# Patient Record
Sex: Male | Born: 1955 | Race: White | Hispanic: No | Marital: Married | Smoking: Former smoker
Health system: Southern US, Community
[De-identification: ages and names within clinical notes are randomized; demographics above are authoritative.]

## PROBLEM LIST (undated history)

## (undated) DIAGNOSIS — M1990 Unspecified osteoarthritis, unspecified site: Secondary | ICD-10-CM

## (undated) DIAGNOSIS — K5900 Constipation, unspecified: Secondary | ICD-10-CM

## (undated) DIAGNOSIS — R7989 Other specified abnormal findings of blood chemistry: Secondary | ICD-10-CM

## (undated) DIAGNOSIS — E119 Type 2 diabetes mellitus without complications: Secondary | ICD-10-CM

## (undated) DIAGNOSIS — E785 Hyperlipidemia, unspecified: Secondary | ICD-10-CM

## (undated) DIAGNOSIS — I2510 Atherosclerotic heart disease of native coronary artery without angina pectoris: Secondary | ICD-10-CM

## (undated) DIAGNOSIS — E559 Vitamin D deficiency, unspecified: Secondary | ICD-10-CM

## (undated) DIAGNOSIS — E538 Deficiency of other specified B group vitamins: Secondary | ICD-10-CM

## (undated) DIAGNOSIS — I10 Essential (primary) hypertension: Secondary | ICD-10-CM

## (undated) DIAGNOSIS — I219 Acute myocardial infarction, unspecified: Secondary | ICD-10-CM

## (undated) HISTORY — DX: Vitamin D deficiency, unspecified: E55.9

## (undated) HISTORY — DX: Acute myocardial infarction, unspecified: I21.9

## (undated) HISTORY — DX: Essential (primary) hypertension: I10

## (undated) HISTORY — DX: Atherosclerotic heart disease of native coronary artery without angina pectoris: I25.10

## (undated) HISTORY — PX: COLONOSCOPY: SHX5424

## (undated) HISTORY — DX: Other specified abnormal findings of blood chemistry: R79.89

## (undated) HISTORY — DX: Hyperlipidemia, unspecified: E78.5

## (undated) HISTORY — PX: JOINT REPLACEMENT: SHX530

## (undated) HISTORY — DX: Deficiency of other specified B group vitamins: E53.8

---
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
## 2012-10-09 DIAGNOSIS — I219 Acute myocardial infarction, unspecified: Secondary | ICD-10-CM

## 2012-10-09 HISTORY — PX: CORONARY ANGIOPLASTY WITH STENT PLACEMENT: SHX49

## 2012-10-09 HISTORY — DX: Acute myocardial infarction, unspecified: I21.9

## 2014-07-09 ENCOUNTER — Other Ambulatory Visit (HOSPITAL_COMMUNITY): Payer: Self-pay | Admitting: Family Medicine

## 2014-07-09 ENCOUNTER — Ambulatory Visit (HOSPITAL_COMMUNITY)
Admission: RE | Admit: 2014-07-09 | Discharge: 2014-07-09 | Disposition: A | Discharge status: Home / Self Care | Payer: Disability Insurance | Source: Ambulatory Visit | Attending: Family Medicine | Admitting: Family Medicine

## 2014-07-09 DIAGNOSIS — M25561 Pain in right knee: Secondary | ICD-10-CM | POA: Diagnosis not present

## 2014-07-09 DIAGNOSIS — M25562 Pain in left knee: Secondary | ICD-10-CM | POA: Diagnosis present

## 2014-12-04 ENCOUNTER — Telehealth: Payer: Self-pay | Admitting: Cardiovascular Disease

## 2014-12-04 ENCOUNTER — Ambulatory Visit (INDEPENDENT_AMBULATORY_CARE_PROVIDER_SITE_OTHER): Payer: Medicaid Other | Admitting: Cardiovascular Disease

## 2014-12-04 ENCOUNTER — Encounter: Payer: Self-pay | Admitting: *Deleted

## 2014-12-04 VITALS — BP 144/94 | HR 80 | Ht 72.0 in | Wt 246.0 lb

## 2014-12-04 DIAGNOSIS — R918 Other nonspecific abnormal finding of lung field: Secondary | ICD-10-CM

## 2014-12-04 DIAGNOSIS — I2510 Atherosclerotic heart disease of native coronary artery without angina pectoris: Secondary | ICD-10-CM

## 2014-12-04 DIAGNOSIS — R635 Abnormal weight gain: Secondary | ICD-10-CM

## 2014-12-04 DIAGNOSIS — I10 Essential (primary) hypertension: Secondary | ICD-10-CM

## 2014-12-04 DIAGNOSIS — Z87891 Personal history of nicotine dependence: Secondary | ICD-10-CM

## 2014-12-04 DIAGNOSIS — Z833 Family history of diabetes mellitus: Secondary | ICD-10-CM

## 2014-12-04 DIAGNOSIS — E785 Hyperlipidemia, unspecified: Secondary | ICD-10-CM

## 2014-12-04 MED ORDER — LISINOPRIL 20 MG PO TABS: 20.0000 mg | ORAL_TABLET | Freq: Every day | ORAL | Status: DC

## 2014-12-04 NOTE — Telephone Encounter (Signed)
Low dose chest CT scan without contrast for pulmonary nodules, h/o tobacco abuse, lung cancer screening Schedule at [facility] March 2 @8 ;00am arrive at 7:45

## 2014-12-04 NOTE — Patient Instructions (Signed)
   Labs:  Lipids & liver function (fasting), HgA1C, BMET (due 2 weeks, 12/18/14)  Stop Plavix  Increase Lisinopril to 20mg  daily - new sent to pharmacy today.  Continue all other medications.   Low dose CT scan  Office will contact with results via phone or letter.   Your physician wants you to follow up in: 6 months.  You will receive a reminder letter in the mail one-two months in advance.  If you don't receive a letter, please call our office to schedule the follow up appointment

## 2014-12-04 NOTE — Progress Notes (Signed)
Patient ID: Darius Rogers, male   DOB: 1956-07-26, 59 y.o.   MRN: [id number]       CARDIOLOGY CONSULT NOTE  Patient ID: Darius Rogers MRN: [id number] DOB/AGE: Aug 22, 1956 59 y.o.  Admit date: (Not on file) Primary Physician Monico Blitz, MD  Reason for Consultation: CAD  HPI: The patient is a 59 year old male with a history of non-ST elevation myocardial infarction and underwent percutaneous coronary intervention with a drug-eluting stent to the LAD in July 2014 at [facility] in [place], [place]. He also has a history of accelerated hypertension, tobacco abuse, and obesity. He had an echocardiogram at that time which reportedly demonstrated normal left ventricular systolic and diastolic function, LVEF 60%. Coronary angiography on 05/05/13 demonstrated a 95% stenosis of the LAD with mild luminal irregularities in the left circumflex and a mid RCA stenosis of 20%. The RCA was the dominant artery. CT angiography of the chest at that time showed no evidence of acute pulmonary embolism but did show small indeterminate bilateral lung nodules and multiple low-density lesions throughout the liver presumed to be cysts.  He denies chest pain and shortness of breath. 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 Interestingly, he had been climbing up a tree for his work and as he was coming down he saw a rattlesnake and that is when he initially began to feel chest pain prior to being diagnosed with a myocardial infarction.  He has had a 30 pound weight gain since moving to [place]. He attributes this to not working and being limited in physical activities due to bilateral knee pain for which he recently received cortisone injections. He said he has not had his cholesterol checked in the past 12 months. He also says he has not had a follow-up chest CT.  Soc: He quit smoking after his heart attack.   No  Known Allergies  Current Outpatient Prescriptions  Medication Sig Dispense Refill  . aspirin EC 81 MG tablet Take 81 mg by mouth daily.    Marland Kitchen atorvastatin (LIPITOR) 40 MG tablet Take 40 mg by mouth daily.    . carvedilol (COREG) 6.25 MG tablet Take 6.25 mg by mouth 2 (two) times daily with a meal.    . clopidogrel (PLAVIX) 75 MG tablet Take 75 mg by mouth daily.    Marland Kitchen lisinopril (PRINIVIL,ZESTRIL) 10 MG tablet Take 10 mg by mouth daily.     No current facility-administered medications for this visit.    Past Medical History  Diagnosis Date  . Hypertension   . Heart attack   . CAD (coronary artery disease)   . Hyperlipemia     Past Surgical History  Procedure Laterality Date  . Left lower arm surgery      in high school around age 70    History   Social History  . Marital Status: Single    Spouse Name: N/A  . Number of Children: N/A  . Years of Education: N/A   Occupational History  . Not on file.   Social History Main Topics  . Smoking status: Former Smoker -- 0.10 packs/day for 8 years    Types: Cigarettes    Start date: 10/09/2004    Quit date: 05/09/2013  . Smokeless tobacco: Never Used  . Alcohol Use: Not on file  . Drug Use: Not on file  . Sexual Activity: Not on file   Other Topics Concern  .  Not on file   Social History Narrative  . No narrative on file     No family history of premature CAD in 1st degree relatives.  Prior to Admission medications   Medication Sig Start Date End Date Taking? Authorizing Provider  aspirin EC 81 MG tablet Take 81 mg by mouth daily.   Yes Historical Provider, MD  atorvastatin (LIPITOR) 40 MG tablet Take 40 mg by mouth daily.   Yes Historical Provider, MD  carvedilol (COREG) 6.25 MG tablet Take 6.25 mg by mouth 2 (two) times daily with a meal.   Yes Historical Provider, MD  clopidogrel (PLAVIX) 75 MG tablet Take 75 mg by mouth daily.   Yes Historical Provider, MD  lisinopril (PRINIVIL,ZESTRIL) 10 MG tablet Take 10 mg  by mouth daily.   Yes Historical Provider, MD     Review of systems complete and found to be negative unless listed above in HPI     Physical exam Blood pressure 144/94, pulse 80, height 6' (1.829 m), weight 246 lb (111.585 kg), SpO2 95 %. General: NAD Neck: No JVD, no thyromegaly or thyroid nodule.  Lungs: Clear to auscultation bilaterally with normal respiratory effort. CV: Nondisplaced PMI. Regular rate and rhythm, normal S1/S2, no S3/S4, no murmur.  No peripheral edema.  No carotid bruit.  Normal pedal pulses.  Abdomen: Soft, nontender, obese, no distention.  Skin: Intact without lesions or rashes.  Neurologic: Alert and oriented x 3.  Psych: Normal affect. Extremities: No clubbing or cyanosis.  HEENT: Normal.   ECG: Most recent ECG reviewed.  Labs:  No results found for: WBC, HGB, HCT, MCV, PLT No results for input(s): NA, K, CL, CO2, BUN, CREATININE, CALCIUM, PROT, BILITOT, ALKPHOS, ALT, AST, GLUCOSE in the last 168 hours.  Invalid input(s): LABALBU No results found for: CKTOTAL, CKMB, CKMBINDEX, TROPONINI No results found for: CHOL No results found for: HDL No results found for: LDLCALC No results found for: TRIG No results found for: CHOLHDL No results found for: LDLDIRECT       Studies: No results found.  ASSESSMENT AND PLAN:  1. CAD with h/o NSTEMI and LAD drug-eluting stent: Symptomatically stable. As it has been over one year since undergoing stent placement and since he only had single vessel disease, I will discontinue Plavix as this is no longer indicated. I will continue aspirin 81 mg daily and Lipitor 40 mg daily along with carvedilol 6.25 mg twice daily. Due to elevated blood pressure, I will increase lisinopril to 10 mg daily.  2. Essential hypertension: Due to elevated blood pressure, I will increase lisinopril to 10 mg daily. Will check BMET in 1-2 weeks.  3. Hyperlipidemia: Will check lipids and LFT's. Continue Lipitor 40 mg daily.  4. Weight  gain: Mother has diabetes. I will check HbA1C.  5. Lung nodules and history of tobacco abuse: Annual screening is indicated for lung cancer with low dose chest CT. I will order.  Dispo: f/u 6 months.   Signed: Kate Sable, M.D., F.A.C.C.  12/04/2014, 2:06 PM

## 2014-12-09 ENCOUNTER — Ambulatory Visit (HOSPITAL_COMMUNITY)
Admission: RE | Admit: 2014-12-09 | Discharge: 2014-12-09 | Disposition: A | Discharge status: Home / Self Care | Payer: Medicaid Other | Source: Ambulatory Visit | Attending: Cardiovascular Disease | Admitting: Cardiovascular Disease

## 2014-12-09 DIAGNOSIS — R918 Other nonspecific abnormal finding of lung field: Secondary | ICD-10-CM

## 2014-12-09 DIAGNOSIS — I700 Atherosclerosis of aorta: Secondary | ICD-10-CM | POA: Insufficient documentation

## 2014-12-09 DIAGNOSIS — I2510 Atherosclerotic heart disease of native coronary artery without angina pectoris: Secondary | ICD-10-CM | POA: Diagnosis not present

## 2014-12-09 DIAGNOSIS — Z87891 Personal history of nicotine dependence: Secondary | ICD-10-CM

## 2014-12-11 ENCOUNTER — Telehealth: Payer: Self-pay | Admitting: *Deleted

## 2014-12-11 DIAGNOSIS — E119 Type 2 diabetes mellitus without complications: Secondary | ICD-10-CM

## 2014-12-11 MED ORDER — METFORMIN HCL 500 MG PO TABS: 500.0000 mg | ORAL_TABLET | Freq: Two times a day (BID) | ORAL | Status: DC

## 2014-12-11 NOTE — Telephone Encounter (Signed)
Notes Recorded by Laurine Blazer, LPN on 02/15/1367 at 5:99 PM Patient notified.

## 2014-12-11 NOTE — Telephone Encounter (Signed)
Notes Recorded by Laurine Blazer, LPN on 12/14/9022 at 0:97 PM Patient notified. Will send new rx to [facility]. Also, will put in referral for dietary consult. Will forward all info to PMD Manuella Ghazi) as cardiology will not manage this long term. Patient will call Dr. Manuella Ghazi for follow up soon in his office to discuss these findings.

## 2014-12-11 NOTE — Telephone Encounter (Addendum)
CT OF CHEST -   ---- Message from Herminio Commons, MD sent at 12/09/2014 10:17 AM EST ----- Would have him f/u with PCP or pulmonary regarding multiple nodules in lung. Should f/u with PCP regarding hepatic lesions.

## 2014-12-11 NOTE — Telephone Encounter (Signed)
LABS -   Notes Recorded by Herminio Commons, MD on 12/11/2014 at 1:55 PM He has newly diagnosed type 2 diabetes mellitus. Will start metformin 500 mg bid and repeat HbA1C in 3 months. He will need a dietary consultation. Lipids look controlled.

## 2014-12-24 NOTE — Telephone Encounter (Signed)
Called the Nutrition and Diabetic Education department at [phone]. Left message asking that they return call to set up appointment.

## 2015-01-14 ENCOUNTER — Other Ambulatory Visit: Payer: Self-pay | Admitting: Cardiovascular Disease

## 2015-02-17 ENCOUNTER — Encounter: Payer: Medicaid Other | Attending: "Endocrinology | Admitting: Nutrition

## 2015-02-17 ENCOUNTER — Encounter: Payer: Self-pay | Admitting: Nutrition

## 2015-02-17 VITALS — Ht 72.0 in | Wt 245.6 lb

## 2015-02-17 DIAGNOSIS — IMO0002 Reserved for concepts with insufficient information to code with codable children: Secondary | ICD-10-CM

## 2015-02-17 DIAGNOSIS — Z713 Dietary counseling and surveillance: Secondary | ICD-10-CM | POA: Diagnosis not present

## 2015-02-17 DIAGNOSIS — E669 Obesity, unspecified: Secondary | ICD-10-CM | POA: Diagnosis not present

## 2015-02-17 DIAGNOSIS — Z6833 Body mass index (BMI) 33.0-33.9, adult: Secondary | ICD-10-CM | POA: Diagnosis not present

## 2015-02-17 DIAGNOSIS — E118 Type 2 diabetes mellitus with unspecified complications: Secondary | ICD-10-CM | POA: Insufficient documentation

## 2015-02-17 DIAGNOSIS — E1165 Type 2 diabetes mellitus with hyperglycemia: Secondary | ICD-10-CM

## 2015-02-17 NOTE — Progress Notes (Signed)
  Medical Nutrition Therapy:  Appt start time: 1400 end time:  1500.   Assessment:  Primary concerns today: Diabetes. Lives with his parents. He does the shopping and shares the cooking. Most foods are fried and baked foods. Most recently moved here from [place] 2  Years ago. Physical activity: joined the GYM in the mall. Has knee problems. Feels slightly depressed since he has moved here from [place] with no friends or social life. He feels he can't do much since he had a heart attack. Wants to lose weight . He notes she eat a lot of the junk food his parents have around the house. His mom cooks [place] ways and has a lot of fat and sugar in them. He is willing to change his eating habits and not eat the junk food around the house.  Recently told he has Type 2 diabetes with A1C of 6.8% from [facility]. He was given a meter but it isn't covered under Medicaid and needs an Accucheck Aviva or Nano since he is on Medicaid. Called Dr. Trena Platt office to request prescription for him to be called in to [facility] in [place]. Will start testing once he gets his supplies.  Recently started on Metformin 500 mg BID. Preferred Learning Style:  Auditory  Visual  Hands on  No preference indicated   Learning Readiness:    Ready  Change in progress   MEDICATIONS: See  list   DIETARY INTAKE   24-hr recall:  B ( AM):  Coffee skips.  Snk ( AM): nothing L ( PM): Tacos 2; burittos supreme and taco, water Snk ( PM):   cakes D ( PM): steak and salad-ginger dressing, water Snk ( PM): strawberry shortcake, water Beverages: Water  Usual physical activity: Not much  Estimated energy needs: 1800 calories 200 g carbohydrates 135 g protein 50 g fat  Progress Towards Goal(s):  In progress.   Nutritional Diagnosis:  NB-1.1 Food and nutrition-related knowledge deficit As related to Diabetes and obesity.  As evidenced by A1C of 6.8% and BMI > 40..    Intervention:  Nutrition  counseling on diabetes, weight loss, signs/symptoms of hyper/hypoglycemia and treatment of both, complications from DM, low fat high fiber low sodium diet, testing, portion sizes, MY Plate, CHO counting and benefits of exercise for weight loss..  Goal: 1. Follow the Plate Method. 2. Do not skip meals. 3. Cut out junk food and snacks between meals. 4. Increase fresh fruits and vegetables. 5. Exercise 30 minutes 5 days per week. 6. Lose 1-2 lbs per week. 7. Get A1C down below 6% in three months. 8. Test blood sugars a few times a week in am and evening before bed and record on log and bring at visit.  Teaching Method Utilized:  Visual Auditory Hands on  Handouts given during visit include:  The Plate Method  THe Meal Plan Card  Barriers to learning/adherence to lifestyle change: Knee problems  Demonstrated degree of understanding via:  Teach Back   Monitoring/Evaluation:  Dietary intake, exercise, Meal planning, SBG, and body weight in 1 month(s).  Recommend to consider referral to [facility] for depression and/or depression medication. He notes he has been depressed having moved from [place] and doesn't have friends or social life here in [place] and caring for his aging parents. Also depressed because he thinks he can't do much now since he has had a heart attack and he has knee problems.

## 2015-02-17 NOTE — Patient Instructions (Signed)
Goal: 1. Follow the Plate Method. 2. Do not skip meals. 3. Cut out junk food and snacks between meals. 4. Increase fresh fruits and vegetables. 5. Exercise 30 minutes 5 days per week. 6. Lose 1-2 lbs per week. 7. Get A1C down below 6% in three months. 8. Test blood sugars a few times a week in am and evening before bed and record on log and bring at visit.

## 2015-04-08 ENCOUNTER — Ambulatory Visit: Payer: Medicaid Other | Admitting: Nutrition

## 2015-07-20 ENCOUNTER — Other Ambulatory Visit: Payer: Self-pay | Admitting: Cardiovascular Disease

## 2015-08-22 ENCOUNTER — Other Ambulatory Visit: Payer: Self-pay | Admitting: Cardiovascular Disease

## 2015-09-06 ENCOUNTER — Other Ambulatory Visit: Payer: Self-pay | Admitting: Cardiovascular Disease

## 2015-09-06 MED ORDER — ATORVASTATIN CALCIUM 40 MG PO TABS: 40.0000 mg | ORAL_TABLET | Freq: Every day | ORAL | Status: DC

## 2015-09-06 NOTE — Telephone Encounter (Signed)
(  LIPITOR) 40 MG tablet patient missed last appointment.  Needs refill . Appointment given to patient for follow up.  [place]. [place], [place]

## 2015-09-06 NOTE — Telephone Encounter (Signed)
Medication sent to pharmacy  

## 2015-09-14 ENCOUNTER — Other Ambulatory Visit: Payer: Self-pay | Admitting: Cardiovascular Disease

## 2015-09-15 ENCOUNTER — Other Ambulatory Visit: Payer: Self-pay | Admitting: *Deleted

## 2015-09-15 NOTE — Telephone Encounter (Signed)
Pt scheduled appt for 09/27/15 with Dr Bronson Ing and says [facility] told him atorvastatin refill was never received. Spoke with [place] at [facility] who says that medication was placed on hold and she took verbal order to go ahead and refill it. Pt made aware and will pick up medication later this afternoon.

## 2015-09-27 ENCOUNTER — Encounter: Payer: Self-pay | Admitting: Cardiovascular Disease

## 2015-09-27 ENCOUNTER — Ambulatory Visit (INDEPENDENT_AMBULATORY_CARE_PROVIDER_SITE_OTHER): Payer: Medicaid Other | Admitting: Cardiovascular Disease

## 2015-09-27 VITALS — BP 148/92 | HR 88 | Ht 72.0 in | Wt 248.0 lb

## 2015-09-27 DIAGNOSIS — Z87891 Personal history of nicotine dependence: Secondary | ICD-10-CM

## 2015-09-27 DIAGNOSIS — E785 Hyperlipidemia, unspecified: Secondary | ICD-10-CM

## 2015-09-27 DIAGNOSIS — E119 Type 2 diabetes mellitus without complications: Secondary | ICD-10-CM

## 2015-09-27 DIAGNOSIS — I2510 Atherosclerotic heart disease of native coronary artery without angina pectoris: Secondary | ICD-10-CM

## 2015-09-27 DIAGNOSIS — I10 Essential (primary) hypertension: Secondary | ICD-10-CM | POA: Diagnosis not present

## 2015-09-27 MED ORDER — CARVEDILOL 12.5 MG PO TABS: 12.5000 mg | ORAL_TABLET | Freq: Two times a day (BID) | ORAL | Status: DC

## 2015-09-27 NOTE — Progress Notes (Signed)
Patient ID: Darius Rogers, male   DOB: 21-May-1956, 59 y.o.   MRN: [id number]      SUBJECTIVE: The patient is a 59 year old male with a history of non-ST elevation myocardial infarction and underwent percutaneous coronary intervention with a drug-eluting stent to the LAD in July 2014 at [facility] in [place], [place]. He also has a history of accelerated hypertension, tobacco abuse, and obesity. He had an echocardiogram at that time which reportedly demonstrated normal left ventricular systolic and diastolic function, LVEF [id number]. Coronary angiography on 05/05/13 demonstrated a 95% stenosis of the LAD with mild luminal irregularities in the left circumflex and a mid RCA stenosis of 20%. The RCA was the dominant artery.  He denies chest pain and shortness of breath. Taking all his meds regularly. Says blood sugars run in 140 range.  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 Interestingly, he had been climbing up a tree for his work and as he was coming down he saw a rattlesnake and that is when he initially began to feel chest pain prior to being diagnosed with a myocardial infarction.   Review of Systems: As per "subjective", otherwise negative.  No Known Allergies  Current Outpatient Prescriptions  Medication Sig Dispense Refill  . aspirin EC 81 MG tablet Take 81 mg by mouth daily.    Marland Kitchen atorvastatin (LIPITOR) 40 MG tablet Take 1 tablet (40 mg total) by mouth daily. 30 tablet 3  . carvedilol (COREG) 6.25 MG tablet Take 6.25 mg by mouth 2 (two) times daily with a meal.    . lisinopril (PRINIVIL,ZESTRIL) 20 MG tablet TAKE ONE TABLET BY MOUTH ONCE DAILY NEEDS  APPOINTMENT  FOR  FURTHER  REFILLS 30 tablet 0  . metFORMIN (GLUCOPHAGE) 500 MG tablet Take 1 tablet (500 mg total) by mouth 2 (two) times daily. 60 tablet 0   No current facility-administered medications for this visit.    Past Medical  History  Diagnosis Date  . Hypertension   . Heart attack ([place])   . CAD (coronary artery disease)   . Hyperlipemia   . Diabetes mellitus without complication [facility])     Past Surgical History  Procedure Laterality Date  . Left lower arm surgery      in high school around age 27    Social History   Social History  . Marital Status: Single    Spouse Name: N/A  . Number of Children: N/A  . Years of Education: N/A   Occupational History  . Not on file.   Social History Main Topics  . Smoking status: Former Smoker -- 0.10 packs/day for 8 years    Types: Cigarettes    Start date: 10/09/2004    Quit date: 05/09/2013  . Smokeless tobacco: Never Used  . Alcohol Use: Not on file  . Drug Use: Not on file  . Sexual Activity: Not on file   Other Topics Concern  . Not on file   Social History Narrative     Filed Vitals:   09/27/15 1433  BP: 148/92  Pulse: 88  Height: 6' (1.829 m)  Weight: 248 lb (112.492 kg)    PHYSICAL EXAM General: NAD HEENT: Normal. Neck: No JVD, no thyromegaly. Lungs: Clear to auscultation bilaterally with normal respiratory effort. CV: Nondisplaced PMI.  Regular rate and rhythm, normal S1/S2, no S3/S4, no murmur. No pretibial or periankle edema.  No carotid bruit.  Normal pedal pulses.  Abdomen: Soft, nontender, no hepatosplenomegaly, no distention.  Neurologic: Alert and oriented x 3.  Psych: Normal affect. Skin: Normal. Musculoskeletal: Normal range of motion, no gross deformities. Extremities: No clubbing or cyanosis.   ECG: Most recent ECG reviewed.      ASSESSMENT AND PLAN: 1. CAD with h/o NSTEMI and LAD drug-eluting stent: Symptomatically stable. I will continue aspirin 81 mg daily and Lipitor 40 mg daily along, lisinopril 20 mg, and increase carvedilol to 12.5 mg twice daily.   2. Essential hypertension: Elevated. Will increase carvedilol to 12.5 mg twice daily.   3. Hyperlipidemia: Controlled in 12/2014. Continue Lipitor 40 mg  daily.  4. Type 2 diabetes: I started metformin 500 mg bid in 12/2014 for newly diagnosed diabetes. Followed by PCP now.  Dispo: f/u 6 months.    Kate Sable, M.D., F.A.C.C.

## 2015-09-27 NOTE — Addendum Note (Signed)
Addended by: Laurine Blazer on: 09/27/2015 03:08 PM   Modules accepted: Orders

## 2015-09-27 NOTE — Patient Instructions (Addendum)
   Increase Coreg to 12.5mg  twice a day  - new sent to [facility] today. Continue all other medications.   Your physician wants you to follow up in: 6 months.  You will receive a reminder letter in the mail one-two months in advance.  If you don't receive a letter, please call our office to schedule the follow up appointment

## 2015-10-14 ENCOUNTER — Other Ambulatory Visit: Payer: Self-pay | Admitting: Cardiovascular Disease

## 2016-03-14 ENCOUNTER — Ambulatory Visit (INDEPENDENT_AMBULATORY_CARE_PROVIDER_SITE_OTHER): Payer: Medicaid Other | Admitting: Cardiovascular Disease

## 2016-03-14 ENCOUNTER — Encounter: Payer: Self-pay | Admitting: Cardiovascular Disease

## 2016-03-14 VITALS — BP 110/70 | HR 75 | Ht 72.0 in | Wt 243.0 lb

## 2016-03-14 DIAGNOSIS — I2510 Atherosclerotic heart disease of native coronary artery without angina pectoris: Secondary | ICD-10-CM

## 2016-03-14 DIAGNOSIS — I10 Essential (primary) hypertension: Secondary | ICD-10-CM

## 2016-03-14 DIAGNOSIS — Z87891 Personal history of nicotine dependence: Secondary | ICD-10-CM | POA: Diagnosis not present

## 2016-03-14 DIAGNOSIS — E785 Hyperlipidemia, unspecified: Secondary | ICD-10-CM | POA: Diagnosis not present

## 2016-03-14 DIAGNOSIS — E119 Type 2 diabetes mellitus without complications: Secondary | ICD-10-CM

## 2016-03-14 MED ORDER — ASPIRIN EC 81 MG PO TBEC: 81.0000 mg | DELAYED_RELEASE_TABLET | Freq: Every day | ORAL | Status: DC

## 2016-03-14 MED ORDER — MELOXICAM 15 MG PO TABS: 15.0000 mg | ORAL_TABLET | ORAL | Status: DC | PRN

## 2016-03-14 NOTE — Patient Instructions (Signed)
   Take your Meloxicam only as needed.  Resume your Aspirin 81mg  daily. Continue all other medications.   Lab for Lipids - order given today - Reminder:  Nothing to eat or drink after 12 midnight prior to labs. Office will contact with results via phone or letter.    Your physician wants you to follow up in:  1 year.  You will receive a reminder letter in the mail one-two months in advance.  If you don't receive a letter, please call our office to schedule the follow up appointment

## 2016-03-14 NOTE — Progress Notes (Signed)
Patient ID: Darius Rogers, male   DOB: 27-May-1956, 60 y.o.   MRN: [id number]      SUBJECTIVE: The patient presents for routine follow-up. He has a history of non-ST elevation myocardial infarction and underwent percutaneous coronary intervention with a drug-eluting stent to the LAD in July 2014 at [facility] in [place], [place]. He also has a history of accelerated hypertension, tobacco abuse, and obesity. He had an echocardiogram at that time which reportedly demonstrated normal left ventricular systolic and diastolic function, LVEF [id number]. Coronary angiography on 05/05/13 demonstrated a 95% stenosis of the LAD with mild luminal irregularities in the left circumflex and a mid RCA stenosis of 20%. The RCA was the dominant artery.  He denies chest pain and shortness of breath. He has problems with bilateral knee pain. He has been taking an NSAID daily. He stopped taking his aspirin. He denies abdominal pain and GI bleeding.  ECG performed in the office today which I personally interpreted demonstrated normal sinus rhythm with nonspecific T-wave abnormalities.  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 Interestingly, he had been climbing up a tree for his work and as he was coming down he saw a rattlesnake and that is when he initially began to feel chest pain prior to being diagnosed with a myocardial infarction.   Review of Systems: As per "subjective", otherwise negative.  No Known Allergies  Current Outpatient Prescriptions  Medication Sig Dispense Refill  . atorvastatin (LIPITOR) 40 MG tablet Take 1 tablet (40 mg total) by mouth daily. 30 tablet 3  . carvedilol (COREG) 12.5 MG tablet Take 1 tablet (12.5 mg total) by mouth 2 (two) times daily. 60 tablet 6  . lisinopril (PRINIVIL,ZESTRIL) 20 MG tablet TAKE ONE TABLET BY MOUTH ONCE DAILY ,  NEED  APPT  WITH  DR  FOR  ANY  FURTHER  REFILLS 30 tablet  11  . meloxicam (MOBIC) 15 MG tablet Take 15 mg by mouth daily.    . metFORMIN (GLUCOPHAGE) 500 MG tablet Take 1 tablet (500 mg total) by mouth 2 (two) times daily. 60 tablet 0  . NON FORMULARY 2 (two) times daily. hydroxycut 441mg      No current facility-administered medications for this visit.    Past Medical History  Diagnosis Date  . Hypertension   . Heart attack ([place])   . CAD (coronary artery disease)   . Hyperlipemia   . Diabetes mellitus without complication [facility])     Past Surgical History  Procedure Laterality Date  . Left lower arm surgery      in high school around age 28    Social History   Social History  . Marital Status: Single    Spouse Name: N/A  . Number of Children: N/A  . Years of Education: N/A   Occupational History  . Not on file.   Social History Main Topics  . Smoking status: Former Smoker -- 0.10 packs/day for 8 years    Types: Cigarettes    Start date: 10/09/2004    Quit date: 05/09/2013  . Smokeless tobacco: Never Used  . Alcohol Use: Not on file  . Drug Use: Not on file  . Sexual Activity: Not on file   Other Topics Concern  . Not on file   Social History Narrative     Filed Vitals:   03/14/16 1304  BP: 110/70  Pulse: 75  Height: 6' (  1.829 m)  Weight: 243 lb (110.224 kg)  SpO2: 95%    PHYSICAL EXAM General: NAD HEENT: Normal. Neck: No JVD, no thyromegaly. Lungs: Clear to auscultation bilaterally with normal respiratory effort. CV: Nondisplaced PMI.  Regular rate and rhythm, normal S1/S2, no S3/S4, no murmur. No pretibial or periankle edema.  No carotid bruit.   Abdomen: Obese. Neurologic: Alert and oriented.  Psych: Normal affect. Skin: Normal. Musculoskeletal: No gross deformities.    ECG: Most recent ECG reviewed.      ASSESSMENT AND PLAN: 1. CAD with h/o NSTEMI and LAD drug-eluting stent: Symptomatically stable. I will continue aspirin 81 mg daily (instructed to resume), Lipitor 40 mg daily, lisinopril 20  mg, and carvedilol 12.5 mg twice daily.  I told him to avoid taking NSAIDs daily as it increases his risk for GI bleeding.  2. Essential hypertension: Controlled. No changes.  3. Hyperlipidemia: Will check lipids. Continue Lipitor 40 mg daily.  4. Type 2 diabetes: I started metformin 500 mg bid in 12/2014 for newly diagnosed diabetes. Followed by PCP now.  Dispo: f/u 1 year.   Kate Sable, M.D., F.A.C.C.

## 2016-03-20 ENCOUNTER — Ambulatory Visit: Payer: Medicaid Other | Admitting: Cardiovascular Disease

## 2016-03-21 ENCOUNTER — Encounter: Payer: Self-pay | Admitting: *Deleted

## 2016-03-21 ENCOUNTER — Telehealth: Payer: Self-pay | Admitting: *Deleted

## 2016-03-21 NOTE — Telephone Encounter (Signed)
Notes Recorded by Laurine Blazer, LPN on [id number] at [id number] AM Patient notified and verbalized understanding. Copy to pmd. Diet info mailed to patient.   Notes Recorded by Herminio Commons, MD on 03/16/2016 at 10:50 AM Good LDL. TG's only mildly elevated. Requires dietary modification.

## 2016-08-28 ENCOUNTER — Other Ambulatory Visit: Payer: Self-pay | Admitting: Cardiovascular Disease

## 2016-10-26 ENCOUNTER — Other Ambulatory Visit: Payer: Self-pay | Admitting: Cardiovascular Disease

## 2017-01-18 ENCOUNTER — Other Ambulatory Visit: Payer: Self-pay | Admitting: Cardiovascular Disease

## 2017-01-18 MED ORDER — ATORVASTATIN CALCIUM 40 MG PO TABS: 40.0000 mg | ORAL_TABLET | Freq: Every day | ORAL | 3 refills | Status: DC

## 2017-01-18 NOTE — Telephone Encounter (Signed)
° °  What is needed(details of request)? REFILL REQUEST   Urgent-Message or medical issue (Chest/Arm pain, neck pain, Shortness of Breath, dizziness, weak)-Nurse to be contacted immediately.  Non-Urgent Message-Route message to nurse/provider.  Needs refill for atorvastatin (LIPITOR) 40 MG tablet   [facility] [place]

## 2017-01-18 NOTE — Telephone Encounter (Signed)
Medication sent to pharmacy  

## 2017-01-31 ENCOUNTER — Ambulatory Visit (INDEPENDENT_AMBULATORY_CARE_PROVIDER_SITE_OTHER): Payer: Self-pay

## 2017-01-31 ENCOUNTER — Encounter (INDEPENDENT_AMBULATORY_CARE_PROVIDER_SITE_OTHER): Payer: Self-pay | Admitting: Orthopaedic Surgery

## 2017-01-31 ENCOUNTER — Ambulatory Visit (INDEPENDENT_AMBULATORY_CARE_PROVIDER_SITE_OTHER): Payer: Medicaid Other | Admitting: Orthopaedic Surgery

## 2017-01-31 ENCOUNTER — Ambulatory Visit (INDEPENDENT_AMBULATORY_CARE_PROVIDER_SITE_OTHER): Payer: Medicaid Other

## 2017-01-31 VITALS — BP 105/74 | HR 76 | Ht 72.0 in | Wt 237.0 lb

## 2017-01-31 DIAGNOSIS — M25561 Pain in right knee: Secondary | ICD-10-CM

## 2017-01-31 DIAGNOSIS — M25562 Pain in left knee: Secondary | ICD-10-CM

## 2017-01-31 DIAGNOSIS — G8929 Other chronic pain: Secondary | ICD-10-CM

## 2017-01-31 NOTE — Progress Notes (Signed)
Office Visit Note   Patient: Darius Rogers           Date of Birth: 12-05-1955           MRN: [id number] Visit Date: 01/31/2017              Requested by: Monico Blitz, MD [place], [place] [address] PCP: Monico Blitz, MD   Assessment & Plan: Visit Diagnoses:  1. Chronic pain of both knees   End-stage osteoarthritis both knees  Plan: Long discussion regarding end stage findings per film. His Medicaid will no longer cover Visco supplementation. We discussed medicines, injections, bracing and even knee replacement. He is planning a trip to the [place] in June and I would suggest that he wait until a week or so before the trip to have a cortisone injection. He's thinking about having a right knee replacement sometime in the summer. We talked about the incision the hospitalization the rehabilitation. I think he has a good understanding of the above procedure. Think about this more over the next several months  Follow-Up Instructions: Return in about 1 month (around 03/02/2017).   Orders:  Orders Placed This Encounter  Procedures  . XR KNEE 3 VIEW LEFT  . XR KNEE 3 VIEW RIGHT   No orders of the defined types were placed in this encounter.     Procedures: No procedures performed   Clinical Data: No additional findings.   Subjective: No chief complaint on file. HPI  Mr. Couper relates that he continues to have pain in both knees sometimes to the point of compromise. He is having more trouble on the right than the left. He did complete a series of Euflexxa injections in the fall of last year which made a "big difference". He is now aware that Medicaid no longer covers the Visco supplementation. He is anticipating a trip to the [place] in June and wanted to discuss both knee replacement as well as cortisone injection  Review of Systems   Objective: Vital Signs: BP 105/74   Pulse 76   Ht 6' (1.829 m)   Wt 237 lb (107.5 kg)   BMI 32.14 kg/m   Physical  Exam  Ortho Exam exam of both knees is about the same there are just a few degrees lack of full extension bilaterally effusions. Significant patella crepitation bilaterally. Predominantly medial joint pain with increased varus on weightbearing. +2 pulses. Neurovascular exam intact. No distal edema. Flexed over 105 without instability. No hip pain. Straight leg raise negative  Specialty Comments:  No specialty comments available.  Imaging: Xr Knee 3 View Left  Result Date: 01/31/2017 3 views of the left knee were obtained in the standing projection. Findings are similar to those identified on the right. End-stage osteoarthritic changes in all 3 compartments with bone-on-bone medially and approximately 5 of varus. There are peripheral osteophytes and subchondral sclerosis.  Xr Knee 3 View Right  Result Date: 01/31/2017 Films of the right knee were obtained in 3 projections standing. He is bone-on-bone in the medial compartment with approximately 6 of varus. Subchondral sclerosis is identified along the proximal tibia and femur. Full osteophytes identified in all 3 compartments.  lateral patella tilt. All the above consistent with end-stage osteoarthritis    PMFS History: There are no active problems to display for this patient.  Past Medical History:  Diagnosis Date  . CAD (coronary artery disease)   . Diabetes mellitus without complication ([place])   . Heart attack ([place])   . Hyperlipemia   .  Hypertension     Family History  Problem Relation Age of Onset  . Hypertension Mother   . Diabetes Mother   . Post-traumatic stress disorder Father   . Sleep apnea Father   . Other Maternal Grandmother     PPM  . Heart attack Maternal Grandfather     Past Surgical History:  Procedure Laterality Date  . LEFT LOWER ARM SURGERY     in high school around age 89   Social History   Occupational History  . Not on file.   Social History Main Topics  . Smoking status: Former Smoker     Packs/day: 0.10    Years: 8.00    Types: Cigarettes    Start date: 10/09/2004    Quit date: 05/09/2013  . Smokeless tobacco: Never Used  . Alcohol use No  . Drug use: No  . Sexual activity: Not on file     Garald Balding, MD   Note - This record has been created using [employer].  Chart creation errors have been sought, but may not always  have been located. Such creation errors do not reflect on  the standard of medical care.

## 2017-02-01 ENCOUNTER — Telehealth (INDEPENDENT_AMBULATORY_CARE_PROVIDER_SITE_OTHER): Payer: Self-pay | Admitting: Orthopaedic Surgery

## 2017-02-01 NOTE — Telephone Encounter (Signed)
Patient ready to schedule Bilateral Total Knees. Please call to schedule.

## 2017-02-01 NOTE — Telephone Encounter (Signed)
Please advise 

## 2017-02-02 ENCOUNTER — Telehealth (INDEPENDENT_AMBULATORY_CARE_PROVIDER_SITE_OTHER): Payer: Self-pay

## 2017-02-02 NOTE — Telephone Encounter (Signed)
Spoke with pt per PW. He wants to schedule in July fofr surgery. He has an appt with his cardiologist in  July and will get his clearance then. I called [place] office and he will pick up clearance form there then we will schedule his H &P. Pt happy with this.

## 2017-02-02 NOTE — Telephone Encounter (Signed)
Returned call in phone messages with documentation

## 2017-02-02 NOTE — Telephone Encounter (Signed)
Pt wants a right TKR per prior discussion-please check with him and I will complete a blue sheet

## 2017-04-16 ENCOUNTER — Encounter: Payer: Self-pay | Admitting: Cardiovascular Disease

## 2017-04-16 ENCOUNTER — Ambulatory Visit (INDEPENDENT_AMBULATORY_CARE_PROVIDER_SITE_OTHER): Payer: Medicaid Other | Admitting: Cardiovascular Disease

## 2017-04-16 VITALS — BP 115/79 | HR 78 | Ht 72.0 in | Wt 234.0 lb

## 2017-04-16 DIAGNOSIS — E119 Type 2 diabetes mellitus without complications: Secondary | ICD-10-CM

## 2017-04-16 DIAGNOSIS — I10 Essential (primary) hypertension: Secondary | ICD-10-CM | POA: Diagnosis not present

## 2017-04-16 DIAGNOSIS — E782 Mixed hyperlipidemia: Secondary | ICD-10-CM

## 2017-04-16 DIAGNOSIS — I25118 Atherosclerotic heart disease of native coronary artery with other forms of angina pectoris: Secondary | ICD-10-CM

## 2017-04-16 DIAGNOSIS — Z955 Presence of coronary angioplasty implant and graft: Secondary | ICD-10-CM

## 2017-04-16 DIAGNOSIS — Z01818 Encounter for other preprocedural examination: Secondary | ICD-10-CM | POA: Diagnosis not present

## 2017-04-16 NOTE — Progress Notes (Signed)
SUBJECTIVE: The patient presents for routine follow-up. He has a history of non-ST elevation myocardial infarction and underwent percutaneous coronary intervention with a drug-eluting stent to the LAD in July 2014 at [facility] in [place], [place]. He also has a history of accelerated hypertension, tobacco abuse, and obesity. He had an echocardiogram at that time which reportedly demonstrated normal left ventricular systolic and diastolic function, LVEF 62%. Coronary angiography on 05/05/13 demonstrated a 95% stenosis of the LAD with mild luminal irregularities in the left circumflex and a mid RCA stenosis of 20%. The RCA was the dominant artery.   The patient denies any symptoms of chest pain, palpitations, shortness of breath, lightheadedness, dizziness, leg swelling, orthopnea, PND, and syncope.  He has to undergo bilateral knee replacement.  He tells me HbA1c was recently less than 7%.  He recently returned from a trip to the [place] where he is dating a Marine scientist.    Soc Hx: He used to live in [place] and worked as a Chief Strategy Officer but moved to [place], [place], to be close to his parents who are elderly. Interestingly, he had been climbing up a tree for his work and as he was coming down he saw a rattlesnake and that is when he initially began to feel chest pain prior to being diagnosed with a myocardial infarction.  Review of Systems: As per "subjective", otherwise negative.  No Known Allergies  Current Outpatient Prescriptions  Medication Sig Dispense Refill  . aspirin EC 81 MG tablet Take 1 tablet (81 mg total) by mouth daily.    Marland Kitchen atorvastatin (LIPITOR) 40 MG tablet Take 1 tablet (40 mg total) by mouth daily. 30 tablet 3  . carvedilol (COREG) 12.5 MG tablet TAKE ONE TABLET BY MOUTH TWICE DAILY 60 tablet 6  . lisinopril (PRINIVIL,ZESTRIL) 20 MG tablet Take 1 tablet (20 mg total) by mouth daily. 30 tablet 11  . meloxicam (MOBIC) 15 MG tablet Take 1 tablet (15 mg  total) by mouth as needed for pain.    . metFORMIN (GLUCOPHAGE) 500 MG tablet Take 1 tablet (500 mg total) by mouth 2 (two) times daily. 60 tablet 0   No current facility-administered medications for this visit.     Past Medical History:  Diagnosis Date  . CAD (coronary artery disease)   . Diabetes mellitus without complication ([place])   . Heart attack ([place])   . Hyperlipemia   . Hypertension     Past Surgical History:  Procedure Laterality Date  . LEFT LOWER ARM SURGERY     in high school around age 35    Social History   Social History  . Marital status: Single    Spouse name: N/A  . Number of children: N/A  . Years of education: N/A   Occupational History  . Not on file.   Social History Main Topics  . Smoking status: Former Smoker    Packs/day: 0.10    Years: 8.00    Types: Cigarettes    Start date: 10/09/2004    Quit date: 05/09/2013  . Smokeless tobacco: Never Used  . Alcohol use No  . Drug use: No  . Sexual activity: Not on file   Other Topics Concern  . Not on file   Social History Narrative  . No narrative on file     Vitals:   04/16/17 1251  BP: 115/79  Pulse: 78  SpO2: 96%  Weight: 234 lb (106.1 kg)  Height: 6' (1.829 m)  Wt Readings from Last 3 Encounters:  04/16/17 234 lb (106.1 kg)  01/31/17 237 lb (107.5 kg)  03/14/16 243 lb (110.2 kg)     PHYSICAL EXAM General: NAD HEENT: Normal. Neck: No JVD, no thyromegaly. Lungs: Clear to auscultation bilaterally with normal respiratory effort. CV: Nondisplaced PMI.  Regular rate and rhythm, normal S1/S2, no S3/S4, no murmur. No pretibial or periankle edema.  No carotid bruit.   Abdomen: Soft, nontender, no distention.  Neurologic: Alert and oriented.  Psych: Normal affect. Skin: Normal. Musculoskeletal: No gross deformities.    ECG: Most recent ECG reviewed.   Labs: No results found for: K, BUN, CREATININE, ALT, TSH, HGB   Lipids: No results found for: LDLCALC, LDLDIRECT, CHOL,  TRIG, HDL     ASSESSMENT AND PLAN: 1. CAD with h/o NSTEMI and LAD drug-eluting stent: Symptomatically stable. Continue aspirin, Lipitor, lisinopril, and carvedilol. He is at low risk for a major adverse cardiac event in the perioperative period. He does not require noninvasive imaging at this time.  2. Essential hypertension: Controlled. No changes.  3. Hyperlipidemia: Currently on Lipitor 40 mg. I will check lipids.  4. Type 2 diabetes: I started metformin 500 mg bid in 12/2014 for newly diagnosed diabetes. He remains on this dose. A1c less than 7%.    Disposition: Follow up 1 year.  Kate Sable, M.D., F.A.C.C.

## 2017-04-16 NOTE — Patient Instructions (Signed)
Medication Instructions:  Your physician recommends that you continue on your current medications as directed. Please refer to the Current Medication list given to you today.  Labwork: LIPIDS- FASTING   Testing/Procedures: NONE  Follow-Up: Your physician wants you to follow-up in: [place]. You will receive a reminder letter in the mail two months in advance. If you don't receive a letter, please call our office to schedule the follow-up appointment.  Any Other Special Instructions Will Be Listed Below (If Applicable).  If you need a refill on your cardiac medications before your next appointment, please call your pharmacy.

## 2017-04-16 NOTE — Addendum Note (Signed)
Addended by: Acquanetta Chain on: 04/16/2017 01:16 PM   Modules accepted: Orders

## 2017-04-18 ENCOUNTER — Ambulatory Visit (INDEPENDENT_AMBULATORY_CARE_PROVIDER_SITE_OTHER): Payer: Medicaid Other | Admitting: Orthopedic Surgery

## 2017-04-18 ENCOUNTER — Encounter (INDEPENDENT_AMBULATORY_CARE_PROVIDER_SITE_OTHER): Payer: Self-pay | Admitting: Orthopedic Surgery

## 2017-04-18 VITALS — BP 107/71 | HR 66 | Ht 72.0 in | Wt 230.0 lb

## 2017-04-18 DIAGNOSIS — M1711 Unilateral primary osteoarthritis, right knee: Secondary | ICD-10-CM

## 2017-04-23 ENCOUNTER — Encounter (INDEPENDENT_AMBULATORY_CARE_PROVIDER_SITE_OTHER): Payer: Self-pay | Admitting: Orthopaedic Surgery

## 2017-04-25 ENCOUNTER — Ambulatory Visit (INDEPENDENT_AMBULATORY_CARE_PROVIDER_SITE_OTHER): Payer: Medicaid Other | Admitting: Orthopedic Surgery

## 2017-04-26 ENCOUNTER — Encounter (HOSPITAL_COMMUNITY): Payer: Self-pay

## 2017-04-26 ENCOUNTER — Encounter (HOSPITAL_COMMUNITY)
Admission: RE | Admit: 2017-04-26 | Discharge: 2017-04-26 | Disposition: A | Discharge status: Home / Self Care | Payer: Medicaid Other | Source: Ambulatory Visit | Attending: Orthopaedic Surgery | Admitting: Orthopaedic Surgery

## 2017-04-26 ENCOUNTER — Telehealth: Payer: Self-pay

## 2017-04-26 ENCOUNTER — Ambulatory Visit (HOSPITAL_COMMUNITY)
Admission: RE | Admit: 2017-04-26 | Discharge: 2017-04-26 | Disposition: A | Discharge status: Home / Self Care | Payer: Medicaid Other | Source: Ambulatory Visit | Attending: Orthopedic Surgery | Admitting: Orthopedic Surgery

## 2017-04-26 DIAGNOSIS — I10 Essential (primary) hypertension: Secondary | ICD-10-CM | POA: Insufficient documentation

## 2017-04-26 DIAGNOSIS — Z7982 Long term (current) use of aspirin: Secondary | ICD-10-CM | POA: Diagnosis not present

## 2017-04-26 DIAGNOSIS — E119 Type 2 diabetes mellitus without complications: Secondary | ICD-10-CM | POA: Diagnosis not present

## 2017-04-26 DIAGNOSIS — E785 Hyperlipidemia, unspecified: Secondary | ICD-10-CM | POA: Diagnosis not present

## 2017-04-26 DIAGNOSIS — Z87891 Personal history of nicotine dependence: Secondary | ICD-10-CM | POA: Insufficient documentation

## 2017-04-26 DIAGNOSIS — Z01812 Encounter for preprocedural laboratory examination: Secondary | ICD-10-CM | POA: Diagnosis present

## 2017-04-26 DIAGNOSIS — I2510 Atherosclerotic heart disease of native coronary artery without angina pectoris: Secondary | ICD-10-CM | POA: Insufficient documentation

## 2017-04-26 DIAGNOSIS — Z79899 Other long term (current) drug therapy: Secondary | ICD-10-CM | POA: Diagnosis not present

## 2017-04-26 DIAGNOSIS — Z01818 Encounter for other preprocedural examination: Secondary | ICD-10-CM | POA: Diagnosis present

## 2017-04-26 DIAGNOSIS — Z7984 Long term (current) use of oral hypoglycemic drugs: Secondary | ICD-10-CM | POA: Diagnosis not present

## 2017-04-26 HISTORY — DX: Unspecified osteoarthritis, unspecified site: M19.90

## 2017-04-26 HISTORY — DX: Constipation, unspecified: K59.00

## 2017-04-26 LAB — CBC WITH DIFFERENTIAL/PLATELET
BASOS ABS: 0 10*3/uL (ref 0.0–0.1)
Basophils Relative: 0 %
EOS ABS: 0.4 10*3/uL (ref 0.0–0.7)
EOS PCT: 4 %
HCT: 49 % (ref 39.0–52.0)
HEMOGLOBIN: 16.3 g/dL (ref 13.0–17.0)
LYMPHS ABS: 4.6 10*3/uL — AB (ref 0.7–4.0)
LYMPHS PCT: 39 %
MCH: 30 pg (ref 26.0–34.0)
MCHC: 33.3 g/dL (ref 30.0–36.0)
MCV: 90.2 fL (ref 78.0–100.0)
Monocytes Absolute: 0.9 10*3/uL (ref 0.1–1.0)
Monocytes Relative: 7 %
NEUTROS PCT: 50 %
Neutro Abs: 5.9 10*3/uL (ref 1.7–7.7)
PLATELETS: 251 10*3/uL (ref 150–400)
RBC: 5.43 MIL/uL (ref 4.22–5.81)
RDW: 13.9 % (ref 11.5–15.5)
WBC: 11.8 10*3/uL — AB (ref 4.0–10.5)

## 2017-04-26 LAB — COMPREHENSIVE METABOLIC PANEL
ALK PHOS: 76 U/L (ref 38–126)
ALT: 32 U/L (ref 17–63)
AST: 30 U/L (ref 15–41)
Albumin: 4 g/dL (ref 3.5–5.0)
Anion gap: 10 (ref 5–15)
BUN: 9 mg/dL (ref 6–20)
CALCIUM: 9.3 mg/dL (ref 8.9–10.3)
CHLORIDE: 105 mmol/L (ref 101–111)
CO2: 23 mmol/L (ref 22–32)
CREATININE: 1.04 mg/dL (ref 0.61–1.24)
GFR calc Af Amer: >60 mL/min (ref >60)
GFR calc non Af Amer: >60 mL/min (ref >60)
Glucose, Bld: 129 mg/dL — ABNORMAL HIGH (ref 65–99)
Potassium: 4.2 mmol/L (ref 3.5–5.1)
SODIUM: 138 mmol/L (ref 135–145)
Total Bilirubin: 0.5 mg/dL (ref 0.3–1.2)
Total Protein: 7.1 g/dL (ref 6.5–8.1)

## 2017-04-26 LAB — URINALYSIS, ROUTINE W REFLEX MICROSCOPIC
Bilirubin Urine: NEGATIVE
GLUCOSE, UA: NEGATIVE mg/dL
Hgb urine dipstick: NEGATIVE
KETONES UR: NEGATIVE mg/dL
LEUKOCYTES UA: NEGATIVE
NITRITE: NEGATIVE
PROTEIN: NEGATIVE mg/dL
Specific Gravity, Urine: 1.006 (ref 1.005–1.030)
pH: 5 (ref 5.0–8.0)

## 2017-04-26 LAB — ABO/RH: ABO/RH(D): O POS

## 2017-04-26 LAB — GLUCOSE, CAPILLARY: Glucose-Capillary: 136 mg/dL — ABNORMAL HIGH (ref 65–99)

## 2017-04-26 LAB — SURGICAL PCR SCREEN
MRSA, PCR: NEGATIVE
STAPHYLOCOCCUS AUREUS: NEGATIVE

## 2017-04-26 LAB — TYPE AND SCREEN
ABO/RH(D): O POS
Antibody Screen: NEGATIVE

## 2017-04-26 LAB — PROTIME-INR
INR: 1.03
Prothrombin Time: 13.5 seconds (ref 11.4–15.2)

## 2017-04-26 LAB — APTT: APTT: 31 s (ref 24–36)

## 2017-04-26 NOTE — Progress Notes (Addendum)
How to Manage Your Diabetes Before and After Surgery  Why is it important to control my blood sugar before and after surgery? . Improving blood sugar levels before and after surgery helps healing and can limit problems. . A way of improving blood sugar control is eating a healthy diet by: o  Eating less sugar and carbohydrates o  Increasing activity/exercise o  Talking with your doctor about reaching your blood sugar goals . High blood sugars (greater than 180 mg/dL) can raise your risk of infections and slow your recovery, so you will need to focus on controlling your diabetes during the weeks before surgery. . Make sure that the doctor who takes care of your diabetes knows about your planned surgery including the date and location. How do I manage my blood sugar before surgery? . Check your blood sugar at least 4 times a day, starting 2 days before surgery, to make sure that the level is not too high or low. o Check your blood sugar the morning of your surgery when you wake up and every 2 hours until you get to the Short Stay unit. . If your blood sugar is less than 70 mg/dL, you will need to treat for low blood sugar: o Do not take insulin. o Treat a low blood sugar (less than 70 mg/dL) with  cup of clear juice (cranberry or apple), 4 glucose tablets, OR glucose gel. o Recheck blood sugar in 15 minutes after treatment (to make sure it is greater than 70 mg/dL). If your blood sugar is not greater than 70 mg/dL on recheck, call [phone] for further instructions. . Report your blood sugar to the short stay nurse when you get to Short Stay.  . If you are admitted to the hospital after surgery: o Your blood sugar will be checked by the staff and you will probably be given insulin after surgery (instead of oral diabetes medicines) to make sure you have good blood sugar levels. o The goal for blood sugar control after surgery is 80-180 mg/dL.  WHAT DO I DO ABOUT MY DIABETES MEDICATION? . Do  not take oral diabetes medicines (pills) the morning of surgery.  Patient Signature:  Date:   Nurse Signature:  Date:    

## 2017-04-26 NOTE — Pre-Procedure Instructions (Signed)
Darius Rogers  04/26/2017     Your procedure is scheduled on Tuesday, July 31.  Report to [facility] Admitting at 11:00                      Your surgery or procedure is scheduled for 1:00 PM   Call this number if you have problems the morning of surgery: [phone] (pre- op desk).                 For any other questions, please call [phone], Monday - Friday 8 AM - 4 PM. ask to speak to a nurse.    Remember:  Do not eat food or drink liquids after midnight Monday, July 30   Take these medicines the morning of surgery with A SIP OF WATER: carvedilol (COREG).                Do Not take Metformin the morning of surgery.                           Aspirin - as Instructed by Dr Durward Fortes.        STOP taking Aspirin Products (Goody Powder, Excedrin Migraine), Ibuprofen (Advil), Naproxen (Aleve), Vitamins and Herbal Products (ie Fish Oil).  Special instructions:   [facility]- Preparing For Surgery  Before surgery, you can play an important role. Because skin is not sterile, your skin needs to be as free of germs as possible. You can reduce the number of germs on your skin by washing with CHG (chlorahexidine gluconate) Soap before surgery.  CHG is an antiseptic cleaner which kills germs and bonds with the skin to continue killing germs even after washing.  Please do not use if you have an allergy to CHG or antibacterial soaps. If your skin becomes reddened/irritated stop using the CHG.  Do not shave (including legs and underarms) for at least 48 hours prior to first CHG shower. It is OK to shave your face.  Please follow these instructions carefully.   1. Shower the NIGHT BEFORE SURGERY and the MORNING OF SURGERY with CHG.   2. If you chose to wash your hair, wash your hair first as usual with your normal shampoo.  3. After you shampoo, rinse your hair and body thoroughly to remove the shampoo.  4. Use CHG as you would any other liquid soap. You can apply CHG  directly to the skin and wash gently with a scrungie or a clean washcloth.   5. Apply the CHG Soap to your body ONLY FROM THE NECK DOWN.  Do not use on open wounds or open sores. Avoid contact with your eyes, ears, mouth and genitals (private parts). Wash genitals (private parts) with your normal soap.  6. Wash thoroughly, paying special attention to the area where your surgery will be performed.  7. Thoroughly rinse your body with warm water from the neck down.  8. DO NOT shower/wash with your normal soap after using and rinsing off the CHG Soap.  9. Pat yourself dry with a CLEAN TOWEL.   10. Wear CLEAN PAJAMAS   11. Place CLEAN SHEETS on your bed the night of your first shower and DO NOT SLEEP WITH PETS.  Day of Surgery: Shower as Above Do not apply any deodorants/lotions, [place], cologne. Please wear clean clothes to the hospital/surgery center.    Do not wear jewelry, make-up or nail polish.  Do not shave 48 hours prior to surgery.  Men may shave face and neck.  Do not bring valuables to the hospital.  [facility] is not responsible for any belongings or valuables.  Contacts, dentures or bridgework may not be worn into surgery.  Leave your suitcase in the car.  After surgery it may be brought to your room.  For patients admitted to the hospital, discharge time will be determined by your treatment team.  Please read over the following fact sheets that you were given: Pain Booklet, Patient Instructions for Mupirocin Application, Incentive Spirometry, Surgical Site Infections.

## 2017-04-26 NOTE — Progress Notes (Addendum)
Darius Rogers denies chest pain or shortness of breath.  Darius Rogers has a history of MI in July 2014, patient has a drug eluating stent.  Patient is on Aspirin 81 mg, patient reported that he has not been instructed to stop Aspirin. I called Darius Rogers at Dr Rudene Anda office and asked if they spoke to cardiologist and have instructions for patient. Darius Rogers said she will call cardiologist and then she will call patient and give him instructions.   Darius Rogers PCP is Dr. Monico Blitz , patient reports that he was seen in PCP office a couple week ago and had a spot A1C done.  Patient reports that A1C was 6.7.  Dr Bronson Ing, patients cardiologist started him on [place].  Patient has a glucose monitor but rarely checks CBG.  I went over the  Importance of keeping C BG between 70 - 180 and asked patient to start checking it 1 time a day between now and 2 das prior to surgery.  Two days prior to surgery I asked patient to check CBG 4 times a day.  I asked patient to not if certain foods/drinks tend to make CBG rise to abstain from them.  I requested office notes and labs from Dr Jacquiline Doe.

## 2017-04-26 NOTE — Telephone Encounter (Signed)
Left message with Marily Memos at [facility].

## 2017-04-26 NOTE — Telephone Encounter (Signed)
Ideally, I would not stop ASA given his CAD.

## 2017-04-26 NOTE — Telephone Encounter (Signed)
Dr. Wonda Horner office called stating patient is having surgery on 7/31 and would like to know how long patient needs to hold aspirin prior.

## 2017-04-27 LAB — HEMOGLOBIN A1C
Hgb A1c MFr Bld: 6.7 % — ABNORMAL HIGH (ref 4.8–5.6)
MEAN PLASMA GLUCOSE: 146 mg/dL

## 2017-04-27 LAB — URINE CULTURE: CULTURE: NO GROWTH

## 2017-05-01 ENCOUNTER — Encounter (INDEPENDENT_AMBULATORY_CARE_PROVIDER_SITE_OTHER): Payer: Self-pay | Admitting: Orthopedic Surgery

## 2017-05-01 ENCOUNTER — Ambulatory Visit (INDEPENDENT_AMBULATORY_CARE_PROVIDER_SITE_OTHER): Payer: Medicaid Other | Admitting: Orthopedic Surgery

## 2017-05-01 VITALS — BP 124/76 | HR 62 | Resp 16 | Ht 72.0 in | Wt 233.0 lb

## 2017-05-01 DIAGNOSIS — M1711 Unilateral primary osteoarthritis, right knee: Secondary | ICD-10-CM | POA: Diagnosis not present

## 2017-05-01 NOTE — Progress Notes (Signed)
Anesthesia Chart Review: Patient is a 61 year old male scheduled for right TKA on 05/08/17 by Dr. Durward Fortes.  History includes former smoker (quit '14), HTN, CAD/NSTEMI s/p DES LAD 04/2013 [facility] in [place], [place]), HLD, DM2, arthritis. BMI is consistent with obesity.  PCP is Dr. Monico Blitz. Cardiologist is Dr. Kate Sable, last visit 04/16/17. He wrote, "He is at low risk for a major adverse cardiac event in the perioperative period. He does not require noninvasive imaging at this time." Ideally, he did not want ASA to be held for surgery given CAD history.  Meds include aspirin 81 mg, Lipitor, Coreg, lisinopril, metformin.  BP 114/75   Pulse (!) 58   Temp 36.7 C   Resp 20   Ht 6' (1.829 m)   Wt 234 lb 11.2 oz (106.5 kg)   SpO2 96%   BMI 31.83 kg/m   EKG 04/16/17: SR.  Echo 05/04/13 [facility]): Summary: 1. Left ventricle: Size was normal. Systolic function was normal. Ejection fraction was estimated to be 55%. There were no regional wall motion abnormalities. Wall thickness was increased. Changes were consistent with concentric remodeling (increased wall thickness with normal wall mass). Left ventricular diastolic function parameters are normal for the patient's age. 2. Aortic valve: There was regurgitation. 3. Mitral valve: No regurgitation.  Cardiac cath 05/05/13 ([facility]; scanned under Media tab): Impression: 1. Severe one-vessel CAD.  A. Mid LAD 95% stenosis.  B. Left circumflex has mild luminal irregularities.  C. RCA has mild 20% stenosis. It is dominant. 2. Preserved left ventricular systolic function with ejection fraction 55%. 3. No mitral regurgitation or aortic stenosis. 4. Left ventricular end-diastolic pressure 17 mmHg. 5. Left anterior descending artery revascularization using a drug-eluting stent.  CXR 04/26/17: IMPRESSION: No active cardiopulmonary disease.  Preoperative labs noted. A1c 6.7. Urine  culture no growth.  If no acute changes then I would anticipate that he can proceed as planned.  George Hugh [facility] Short Stay Center/Anesthesiology Phone [phone] 05/01/2017 4:01 PM

## 2017-05-01 NOTE — Progress Notes (Signed)
Darius Fears, MD   Biagio Borg, PA-C [address], [place], [place]  [address]                             [phone]   ORTHOPAEDIC HISTORY & PHYSICAL  Darius Rogers MRN:  [id number] DOB/SEX:  1956/09/04/male  CHIEF COMPLAINT:  Painful right Knee  HISTORY: Patient is a 61 y.o. male presented with a history of pain in the right knee for 4 years. Onset of symptoms was abrupt starting 4 years ago with rapidly worsening course since that time. Prior procedures on the knee are none. Patient has been treated conservatively with over-the-counter NSAIDs and activity modification. Patient currently rates pain in the knee at 8 out of 10 with activity. There is pain at night. present.  [name] relates that he continues to have pain in both knees sometimes to the point of compromise. He is having more trouble on the right than the left. He did complete a series of Euflexxa injections in the fall of last year which made a "big difference". He is now aware that Medicaid no longer covers the Visco supplementation. He did have a corticosteroid injection but it was not beneficial at all.   They have been previously treated with: NSAIDS: Ibuprofen, NSAID, Steriods, visco with mild improvement  Knee injection with corticosteroid  was performed Knee injection with visco supplementation was performed Medications: Motrin, NSAID, Steriods, visco with mild improvement  PAST MEDICAL HISTORY: There are no active problems to display for this patient.  Past Medical History:  Diagnosis Date  . Arthritis   . CAD (coronary artery disease)   . Constipation   . Diabetes mellitus without complication ([place])    Type II  . Heart attack ([place]) 2014  . Hyperlipemia   . Hypertension    Past Surgical History:  Procedure Laterality Date  . Arm surgery Right    age 73  . CARDIAC CATHETERIZATION    . COLONOSCOPY       MEDICATIONS PRIOR TO ADMISSION: No current facility-administered medications  for this encounter.    Current Outpatient Prescriptions  Medication Sig Dispense Refill  . aspirin EC 81 MG tablet Take 1 tablet (81 mg total) by mouth daily.    Marland Kitchen atorvastatin (LIPITOR) 40 MG tablet Take 1 tablet (40 mg total) by mouth daily. (Patient taking differently: Take 40 mg by mouth at bedtime. ) 30 tablet 3  . carvedilol (COREG) 12.5 MG tablet TAKE ONE TABLET BY MOUTH TWICE DAILY 60 tablet 6  . lisinopril (PRINIVIL,ZESTRIL) 20 MG tablet Take 1 tablet (20 mg total) by mouth daily. (Patient taking differently: Take 20 mg by mouth at bedtime. ) 30 tablet 11  . metFORMIN (GLUCOPHAGE) 500 MG tablet Take 1 tablet (500 mg total) by mouth 2 (two) times daily. 60 tablet 0     ALLERGIES:  No Known Allergies  REVIEW OF SYSTEMS:  Review of Systems  Cardiovascular:       History of hypertension  Endo/Heme/Allergies:       History of diabetes mellitus  All other systems reviewed and are negative.   FAMILY HISTORY:   Family History  Problem Relation Age of Onset  . Hypertension Mother   . Diabetes Mother   . Post-traumatic stress disorder Father   . Sleep apnea Father   . Other Maternal Grandmother        PPM  . Heart attack Maternal Grandfather  SOCIAL HISTORY:   Social History   Occupational History  . Not on file.   Social History Main Topics  . Smoking status: Former Smoker    Packs/day: 0.10    Years: 8.00    Types: Cigarettes    Start date: 10/09/2004    Quit date: 05/09/2013  . Smokeless tobacco: Never Used  . Alcohol use No  . Drug use: No  . Sexual activity: Not on file     EXAMINATION:  Vital signs in last 24 hours: BP 124/76 (BP Location: Left Arm, Patient Position: Sitting)   Pulse 62   Ht 6' (1.829 m)   Wt 233 lb (105.7 kg)   BMI 31.60 kg/m   Physical Exam  Constitutional: He is oriented to person, place, and time. He appears well-developed and well-nourished.  HENT:  Head: Normocephalic and atraumatic.  Eyes: Pupils are equal, round, and  reactive to light. Conjunctivae and EOM are normal.  Neck: Neck supple.  No carotid bruits noted  Cardiovascular: Normal rate, regular rhythm, normal heart sounds and intact distal pulses.   No murmur heard. Pulmonary/Chest: Effort normal and breath sounds normal.  Abdominal: Soft. Bowel sounds are normal. There is no tenderness.  Neurological: He is alert and oriented to person, place, and time.  Skin: Skin is warm and dry.  Psychiatric: He has a normal mood and affect. His behavior is normal. Judgment and thought content normal.   Ortho Exam  He lacks a few degrees lack of full extension bilaterally effusions. Significant patella crepitation bilaterally. Predominantly medial joint pain with increased varus on weightbearing. +2 pulses. Neurovascular exam intact. No distal edema. Flexed over 105 without instability.   Imaging Review Plain radiographs demonstrate moderate degenerative joint disease of the right knee. The overall alignment is mild varus. The bone quality appears to be good for age and reported activity level.  Films of the right knee were obtained in 3 projections standing. He is bone-on-bone in the medial compartment with approximately 6 of varus. Subchondral sclerosis is identified along the proximal tibia and femur. Full osteophytes identified in all 3 compartments.  lateral patella tilt. All the above consistent with end-stage osteoarthritis  ASSESSMENT: End stage arthritis, right knee  Past Medical History:  Diagnosis Date  . Arthritis   . CAD (coronary artery disease)   . Constipation   . Diabetes mellitus without complication ([place])    Type II  . Heart attack ([place]) 2014  . Hyperlipemia   . Hypertension     PLAN: Plan for right total knee replacement.  The patient history, physical examination and imaging studies are consistent with moderate degenerative joint disease of the right knee. The patient has failed conservative treatment.  The clearance notes were  reviewed.  After discussion with the patient it was felt that Total Knee Replacement was indicated. The procedure,  risks, and benefits of total knee arthroplasty were presented and reviewed. The risks including but not limited to aseptic loosening, infection, blood clots, vascular and nerve injury, stiffness, patella tracking problems and fracture complications among others were discussed. The patient acknowledged the explanation, agreed to proceed with total knee replacement.  Mike Craze [place], [place] [phone]  05/01/2017 11:03 AM

## 2017-05-01 NOTE — H&P (Signed)
Darius Fears, MD   Darius Borg, PA-C [address], [place], [place]  [address]                             [phone]   ORTHOPAEDIC HISTORY & PHYSICAL  Darius Rogers MRN:  [id number] DOB/SEX:  Feb 18, 1956/male  CHIEF COMPLAINT:  Painful right Knee  HISTORY: Patient is a 61 y.o. male presented with a history of pain in the right knee for 4 years. Onset of symptoms was abrupt starting 4 years ago with rapidly worsening course since that time. Prior procedures on the knee are none. Patient has been treated conservatively with over-the-counter NSAIDs and activity modification. Patient currently rates pain in the knee at 8 out of 10 with activity. There is pain at night. present.  Darius Rogers relates that he continues to have pain in both knees sometimes to the point of compromise. He is having more trouble on the right than the left. He did complete a series of Euflexxa injections in the fall of last year which made a "big difference". He is now aware that Medicaid no longer covers the Visco supplementation. He did have a corticosteroid injection but it was not beneficial at all.   They have been previously treated with: NSAIDS: Ibuprofen, NSAID, Steriods, visco with mild improvement  Knee injection with corticosteroid  was performed Knee injection with visco supplementation was performed Medications: Motrin, NSAID, Steriods, visco with mild improvement  PAST MEDICAL HISTORY: There are no active problems to display for this patient.  Past Medical History:  Diagnosis Date  . Arthritis   . CAD (coronary artery disease)   . Constipation   . Diabetes mellitus without complication ([place])    Type II  . Heart attack ([place]) 2014  . Hyperlipemia   . Hypertension    Past Surgical History:  Procedure Laterality Date  . Arm surgery Right    age 71  . CARDIAC CATHETERIZATION    . COLONOSCOPY       MEDICATIONS PRIOR TO ADMISSION: No current facility-administered medications  for this encounter.    Current Outpatient Prescriptions  Medication Sig Dispense Refill  . aspirin EC 81 MG tablet Take 1 tablet (81 mg total) by mouth daily.    Marland Kitchen atorvastatin (LIPITOR) 40 MG tablet Take 1 tablet (40 mg total) by mouth daily. (Patient taking differently: Take 40 mg by mouth at bedtime. ) 30 tablet 3  . carvedilol (COREG) 12.5 MG tablet TAKE ONE TABLET BY MOUTH TWICE DAILY 60 tablet 6  . lisinopril (PRINIVIL,ZESTRIL) 20 MG tablet Take 1 tablet (20 mg total) by mouth daily. (Patient taking differently: Take 20 mg by mouth at bedtime. ) 30 tablet 11  . metFORMIN (GLUCOPHAGE) 500 MG tablet Take 1 tablet (500 mg total) by mouth 2 (two) times daily. 60 tablet 0     ALLERGIES:  No Known Allergies  REVIEW OF SYSTEMS:  Review of Systems  Cardiovascular:       History of hypertension  Endo/Heme/Allergies:       History of diabetes mellitus  All other systems reviewed and are negative.   FAMILY HISTORY:   Family History  Problem Relation Age of Onset  . Hypertension Mother   . Diabetes Mother   . Post-traumatic stress disorder Father   . Sleep apnea Father   . Other Maternal Grandmother        PPM  . Heart attack Maternal Grandfather  SOCIAL HISTORY:   Social History   Occupational History  . Not on file.   Social History Main Topics  . Smoking status: Former Smoker    Packs/day: 0.10    Years: 8.00    Types: Cigarettes    Start date: 10/09/2004    Quit date: 05/09/2013  . Smokeless tobacco: Never Used  . Alcohol use No  . Drug use: No  . Sexual activity: Not on file     EXAMINATION:  Vital signs in last 24 hours: BP 124/76 (BP Location: Left Arm, Patient Position: Sitting)   Pulse 62   Ht 6' (1.829 m)   Wt 233 lb (105.7 kg)   BMI 31.60 kg/m   Physical Exam  Constitutional: He is oriented to person, place, and time. He appears well-developed and well-nourished.  HENT:  Head: Normocephalic and atraumatic.  Eyes: Pupils are equal, round, and  reactive to light. Conjunctivae and EOM are normal.  Neck: Neck supple.  No carotid bruits noted  Cardiovascular: Normal rate, regular rhythm, normal heart sounds and intact distal pulses.   No murmur heard. Pulmonary/Chest: Effort normal and breath sounds normal.  Abdominal: Soft. Bowel sounds are normal. There is no tenderness.  Neurological: He is alert and oriented to person, place, and time.  Skin: Skin is warm and dry.  Psychiatric: He has a normal mood and affect. His behavior is normal. Judgment and thought content normal.   Ortho Exam  He lacks a few degrees lack of full extension bilaterally effusions. Significant patella crepitation bilaterally. Predominantly medial joint pain with increased varus on weightbearing. +2 pulses. Neurovascular exam intact. No distal edema. Flexed over 105 without instability.   Imaging Review Plain radiographs demonstrate moderate degenerative joint disease of the right knee. The overall alignment is mild varus. The bone quality appears to be good for age and reported activity level.  Films of the right knee were obtained in 3 projections standing. He is bone-on-bone in the medial compartment with approximately 6 of varus. Subchondral sclerosis is identified along the proximal tibia and femur. Full osteophytes identified in all 3 compartments.  lateral patella tilt. All the above consistent with end-stage osteoarthritis  ASSESSMENT: End stage arthritis, right knee  Past Medical History:  Diagnosis Date  . Arthritis   . CAD (coronary artery disease)   . Constipation   . Diabetes mellitus without complication ([place])    Type II  . Heart attack ([place] [facility]) 2014  . Hyperlipemia   . Hypertension     PLAN: Plan for right total knee replacement.  The patient history, physical examination and imaging studies are consistent with moderate degenerative joint disease of the right knee. The patient has failed conservative treatment.  The clearance notes were  reviewed.  After discussion with the patient it was felt that Total Knee Replacement was indicated. The procedure,  risks, and benefits of total knee arthroplasty were presented and reviewed. The risks including but not limited to aseptic loosening, infection, blood clots, vascular and nerve injury, stiffness, patella tracking problems and fracture complications among others were discussed. The patient acknowledged the explanation, agreed to proceed with total knee replacement.  Mike Craze [place], [place] [phone]  05/01/2017 11:03 AM

## 2017-05-04 NOTE — Progress Notes (Signed)
Darius Fears, MD   Biagio Borg, PA-C [address], [place], [place]  [address]                             [phone]   ORTHOPAEDIC HISTORY & PHYSICAL  Darius Rogers MRN:  [id number] DOB/SEX:  05-17-1956/male  CHIEF COMPLAINT:  Painful right Knee  HISTORY: Patient is a 61 y.o. male presented with a history of pain in the right knee for 4 years. Onset of symptoms was abrupt starting 4 years ago with rapidly worsening course since that time. Prior procedures on the knee are none. Patient has been treated conservatively with over-the-counter NSAIDs and activity modification. Patient currently rates pain in the knee at 8 out of 10 with activity. There is pain at night. present.  Darius Rogers relates that he continues to have pain in both knees sometimes to the point of compromise. He is having more trouble on the right than the left. He did complete a series of Euflexxa injections in the fall of last year which made a "big difference". He is now aware that Medicaid no longer covers the Visco supplementation. He did have a corticosteroid injection but it was not beneficial at all.   They have been previously treated with: NSAIDS: Ibuprofen, NSAID, Steriods, visco with mild improvement  Knee injection with corticosteroid  was performed Knee injection with visco supplementation was performed Medications: Motrin, NSAID, Steriods, visco with mild improvement  PAST MEDICAL HISTORY: There are no active problems to display for this patient.      Past Medical History:  Diagnosis Date  . Arthritis   . CAD (coronary artery disease)   . Constipation   . Diabetes mellitus without complication ([place] [facility])    Type II  . Heart attack ([place]) 2014  . Hyperlipemia   . Hypertension         Past Surgical History:  Procedure Laterality Date  . Arm surgery Right    age 39  . CARDIAC CATHETERIZATION    . COLONOSCOPY       MEDICATIONS PRIOR TO ADMISSION: No current  facility-administered medications for this encounter.          Current Outpatient Prescriptions  Medication Sig Dispense Refill  . aspirin EC 81 MG tablet Take 1 tablet (81 mg total) by mouth daily.    Marland Kitchen atorvastatin (LIPITOR) 40 MG tablet Take 1 tablet (40 mg total) by mouth daily. (Patient taking differently: Take 40 mg by mouth at bedtime. ) 30 tablet 3  . carvedilol (COREG) 12.5 MG tablet TAKE ONE TABLET BY MOUTH TWICE DAILY 60 tablet 6  . lisinopril (PRINIVIL,ZESTRIL) 20 MG tablet Take 1 tablet (20 mg total) by mouth daily. (Patient taking differently: Take 20 mg by mouth at bedtime. ) 30 tablet 11  . metFORMIN (GLUCOPHAGE) 500 MG tablet Take 1 tablet (500 mg total) by mouth 2 (two) times daily. 60 tablet 0     ALLERGIES:  No Known Allergies  REVIEW OF SYSTEMS:  Review of Systems  Cardiovascular:       History of hypertension  Endo/Heme/Allergies:       History of diabetes mellitus  All other systems reviewed and are negative.   FAMILY HISTORY:        Family History  Problem Relation Age of Onset  . Hypertension Mother   . Diabetes Mother   . Post-traumatic stress disorder Father   . Sleep apnea Father   . Other  Maternal Grandmother        PPM  . Heart attack Maternal Grandfather     SOCIAL HISTORY:   Social History      Occupational History  . Not on file.   Social History Main Topics  . Smoking status: Former Smoker    Packs/day: 0.10    Years: 8.00    Types: Cigarettes    Start date: 10/09/2004    Quit date: 05/09/2013  . Smokeless tobacco: Never Used  . Alcohol use No  . Drug use: No  . Sexual activity: Not on file     EXAMINATION:  Vital signs in last 24 hours: BP 124/76 (BP Location: Left Arm, Patient Position: Sitting)   Pulse 62   Ht 6' (1.829 m)   Wt 233 lb (105.7 kg)   BMI 31.60 kg/m   Physical Exam  Constitutional: He is oriented to person, place, and time. He appears well-developed and well-nourished.    HENT:  Head: Normocephalic and atraumatic.  Eyes: Pupils are equal, round, and reactive to light. Conjunctivae and EOM are normal.  Neck: Neck supple.  No carotid bruits noted  Cardiovascular: Normal rate, regular rhythm, normal heart sounds and intact distal pulses.   No murmur heard. Pulmonary/Chest: Effort normal and breath sounds normal.  Abdominal: Soft. Bowel sounds are normal. There is no tenderness.  Neurological: He is alert and oriented to person, place, and time.  Skin: Skin is warm and dry.  Psychiatric: He has a normal mood and affect. His behavior is normal. Judgment and thought content normal.   Ortho Exam  He lacks a few degrees lack of full extension bilaterally effusions. Significant patella crepitation bilaterally. Predominantly medial joint pain with increased varus on weightbearing. +2 pulses. Neurovascular exam intact. No distal edema. Flexed over 105 without instability.   Imaging Review Plain radiographs demonstrate moderate degenerative joint disease of the right knee. The overall alignment is mild varus. The bone quality appears to be good for age and reported activity level.  Films of the right knee were obtained in 3 projections standing. He is bone-on-bone in the medial compartment with approximately 6 of varus. Subchondral sclerosis is identified along the proximal tibia and femur. Full osteophytes identified in all 3 compartments. lateral patella tilt. All the above consistent with end-stage osteoarthritis  ASSESSMENT: End stage arthritis, right knee      Past Medical History:  Diagnosis Date  . Arthritis   . CAD (coronary artery disease)   . Constipation   . Diabetes mellitus without complication ([place])    Type II  . Heart attack ([place]) 2014  . Hyperlipemia   . Hypertension     PLAN: Plan for right total knee replacement.  The patient history, physical examination and imaging studies are consistent with moderate degenerative  joint disease of the right knee. The patient has failed conservative treatment.  The clearance notes were reviewed.  After discussion with the patient it was felt that Total Knee Replacement was indicated. The procedure,  risks, and benefits of total knee arthroplasty were presented and reviewed. The risks including but not limited to aseptic loosening, infection, blood clots, vascular and nerve injury, stiffness, patella tracking problems and fracture complications among others were discussed. The patient acknowledged the explanation, agreed to proceed with total knee replacement.  Darius Rogers, [place] [phone]  05/01/2017 11:03 AM

## 2017-05-07 MED ORDER — TRANEXAMIC ACID 1000 MG/10ML IV SOLN: 2000.0000 mg | INTRAVENOUS | Status: DC

## 2017-05-07 MED ORDER — TRANEXAMIC ACID 1000 MG/10ML IV SOLN
2000.0000 mg | INTRAVENOUS | Status: AC
  Administered 2017-05-08: 2000 mg via TOPICAL
  Filled 2017-05-07: qty 20

## 2017-05-08 ENCOUNTER — Encounter (HOSPITAL_COMMUNITY)
Admission: RE | Disposition: A | Discharge status: Home / Self Care | Payer: Self-pay | Source: Ambulatory Visit | Attending: Orthopaedic Surgery

## 2017-05-08 ENCOUNTER — Inpatient Hospital Stay (HOSPITAL_COMMUNITY): Payer: Medicaid Other | Admitting: Vascular Surgery

## 2017-05-08 ENCOUNTER — Encounter (HOSPITAL_COMMUNITY): Payer: Self-pay

## 2017-05-08 ENCOUNTER — Inpatient Hospital Stay (HOSPITAL_COMMUNITY): Payer: Medicaid Other | Admitting: Anesthesiology

## 2017-05-08 ENCOUNTER — Inpatient Hospital Stay (HOSPITAL_COMMUNITY)
Admission: RE | Admit: 2017-05-08 | Discharge: 2017-05-10 | DRG: 470 | Disposition: A | Discharge status: Home / Self Care | Payer: Medicaid Other | Source: Ambulatory Visit | Attending: Orthopaedic Surgery | Admitting: Orthopaedic Surgery

## 2017-05-08 DIAGNOSIS — Z833 Family history of diabetes mellitus: Secondary | ICD-10-CM | POA: Diagnosis not present

## 2017-05-08 DIAGNOSIS — E119 Type 2 diabetes mellitus without complications: Secondary | ICD-10-CM | POA: Diagnosis present

## 2017-05-08 DIAGNOSIS — I10 Essential (primary) hypertension: Secondary | ICD-10-CM | POA: Diagnosis present

## 2017-05-08 DIAGNOSIS — Z7984 Long term (current) use of oral hypoglycemic drugs: Secondary | ICD-10-CM

## 2017-05-08 DIAGNOSIS — M1711 Unilateral primary osteoarthritis, right knee: Secondary | ICD-10-CM | POA: Diagnosis present

## 2017-05-08 DIAGNOSIS — Z96651 Presence of right artificial knee joint: Secondary | ICD-10-CM

## 2017-05-08 DIAGNOSIS — Z955 Presence of coronary angioplasty implant and graft: Secondary | ICD-10-CM | POA: Diagnosis not present

## 2017-05-08 DIAGNOSIS — Z87891 Personal history of nicotine dependence: Secondary | ICD-10-CM

## 2017-05-08 DIAGNOSIS — I2510 Atherosclerotic heart disease of native coronary artery without angina pectoris: Secondary | ICD-10-CM | POA: Diagnosis present

## 2017-05-08 DIAGNOSIS — Z8249 Family history of ischemic heart disease and other diseases of the circulatory system: Secondary | ICD-10-CM | POA: Diagnosis not present

## 2017-05-08 DIAGNOSIS — Z79899 Other long term (current) drug therapy: Secondary | ICD-10-CM

## 2017-05-08 DIAGNOSIS — Z7982 Long term (current) use of aspirin: Secondary | ICD-10-CM

## 2017-05-08 DIAGNOSIS — E785 Hyperlipidemia, unspecified: Secondary | ICD-10-CM | POA: Diagnosis present

## 2017-05-08 DIAGNOSIS — M7120 Synovial cyst of popliteal space [Baker], unspecified knee: Secondary | ICD-10-CM | POA: Diagnosis present

## 2017-05-08 DIAGNOSIS — I252 Old myocardial infarction: Secondary | ICD-10-CM

## 2017-05-08 HISTORY — PX: TOTAL KNEE ARTHROPLASTY: SHX125

## 2017-05-08 LAB — GLUCOSE, CAPILLARY
GLUCOSE-CAPILLARY: 114 mg/dL — AB (ref 65–99)
GLUCOSE-CAPILLARY: 96 mg/dL (ref 65–99)
GLUCOSE-CAPILLARY: 237 mg/dL — AB (ref 65–99)
Glucose-Capillary: 119 mg/dL — ABNORMAL HIGH (ref 65–99)

## 2017-05-08 SURGERY — ARTHROPLASTY, KNEE, TOTAL
Anesthesia: Spinal | Site: Knee | Laterality: Right

## 2017-05-08 MED ORDER — LIDOCAINE 2% (20 MG/ML) 5 ML SYRINGE
INTRAMUSCULAR | Status: AC
  Filled 2017-05-08: qty 5

## 2017-05-08 MED ORDER — GLYCOPYRROLATE 0.2 MG/ML IJ SOLN
INTRAMUSCULAR | Status: DC | PRN
  Administered 2017-05-08: 0.2 mg via INTRAVENOUS

## 2017-05-08 MED ORDER — HYDROMORPHONE HCL 1 MG/ML IJ SOLN: 0.2500 mg | INTRAMUSCULAR | Status: DC | PRN

## 2017-05-08 MED ORDER — PROPOFOL 10 MG/ML IV BOLUS
INTRAVENOUS | Status: AC
  Filled 2017-05-08: qty 20

## 2017-05-08 MED ORDER — SODIUM CHLORIDE 0.9 % IV SOLN: INTRAVENOUS | Status: DC

## 2017-05-08 MED ORDER — RIVAROXABAN 10 MG PO TABS
10.0000 mg | ORAL_TABLET | Freq: Every day | ORAL | Status: DC
  Administered 2017-05-09 – 2017-05-10 (×2): 10 mg via ORAL
  Filled 2017-05-08 (×2): qty 1

## 2017-05-08 MED ORDER — ATORVASTATIN CALCIUM 40 MG PO TABS
40.0000 mg | ORAL_TABLET | Freq: Every day | ORAL | Status: DC
  Administered 2017-05-08 – 2017-05-09 (×2): 40 mg via ORAL
  Filled 2017-05-08 (×2): qty 1

## 2017-05-08 MED ORDER — INSULIN ASPART 100 UNIT/ML ~~LOC~~ SOLN
0.0000 [IU] | Freq: Every day | SUBCUTANEOUS | Status: DC
  Administered 2017-05-08: 2 [IU] via SUBCUTANEOUS

## 2017-05-08 MED ORDER — ONDANSETRON HCL 4 MG PO TABS: 4.0000 mg | ORAL_TABLET | Freq: Four times a day (QID) | ORAL | Status: DC | PRN

## 2017-05-08 MED ORDER — HYDROMORPHONE HCL 1 MG/ML IJ SOLN
0.5000 mg | INTRAMUSCULAR | Status: DC | PRN
  Administered 2017-05-09 (×2): 0.5 mg via INTRAVENOUS
  Filled 2017-05-08 (×2): qty 1

## 2017-05-08 MED ORDER — MIDAZOLAM HCL 2 MG/2ML IJ SOLN
INTRAMUSCULAR | Status: AC
  Administered 2017-05-08: 2 mg
  Filled 2017-05-08: qty 2

## 2017-05-08 MED ORDER — CEFAZOLIN SODIUM-DEXTROSE 2-4 GM/100ML-% IV SOLN
INTRAVENOUS | Status: AC
  Filled 2017-05-08: qty 100

## 2017-05-08 MED ORDER — PHENYLEPHRINE HCL 10 MG/ML IJ SOLN
INTRAVENOUS | Status: DC | PRN
  Administered 2017-05-08: 20 ug/min via INTRAVENOUS

## 2017-05-08 MED ORDER — CHLORHEXIDINE GLUCONATE 4 % EX LIQD: 60.0000 mL | Freq: Once | CUTANEOUS | Status: DC

## 2017-05-08 MED ORDER — FENTANYL CITRATE (PF) 250 MCG/5ML IJ SOLN
INTRAMUSCULAR | Status: AC
  Filled 2017-05-08: qty 5

## 2017-05-08 MED ORDER — SODIUM CHLORIDE 0.9 % IR SOLN
Status: DC | PRN
  Administered 2017-05-08: 1000 mL

## 2017-05-08 MED ORDER — OXYCODONE HCL 5 MG PO TABS
5.0000 mg | ORAL_TABLET | ORAL | Status: DC | PRN
  Administered 2017-05-08 – 2017-05-10 (×8): 10 mg via ORAL
  Filled 2017-05-08 (×8): qty 2

## 2017-05-08 MED ORDER — LACTATED RINGERS IV SOLN
INTRAVENOUS | Status: DC
  Administered 2017-05-08 (×4): via INTRAVENOUS

## 2017-05-08 MED ORDER — FENTANYL CITRATE (PF) 100 MCG/2ML IJ SOLN
INTRAMUSCULAR | Status: AC
  Administered 2017-05-08: 50 ug
  Filled 2017-05-08: qty 2

## 2017-05-08 MED ORDER — PHENOL 1.4 % MT LIQD: 1.0000 | OROMUCOSAL | Status: DC | PRN

## 2017-05-08 MED ORDER — BUPIVACAINE-EPINEPHRINE 0.25% -1:200000 IJ SOLN
INTRAMUSCULAR | Status: DC | PRN
  Administered 2017-05-08: 30 mL

## 2017-05-08 MED ORDER — BISACODYL 10 MG RE SUPP: 10.0000 mg | Freq: Every day | RECTAL | Status: DC | PRN

## 2017-05-08 MED ORDER — PROMETHAZINE HCL 25 MG/ML IJ SOLN: 6.2500 mg | INTRAMUSCULAR | Status: DC | PRN

## 2017-05-08 MED ORDER — LACTATED RINGERS IV SOLN: INTRAVENOUS | Status: DC

## 2017-05-08 MED ORDER — METHOCARBAMOL 500 MG PO TABS
500.0000 mg | ORAL_TABLET | Freq: Four times a day (QID) | ORAL | Status: DC | PRN
  Administered 2017-05-08 – 2017-05-10 (×3): 500 mg via ORAL
  Filled 2017-05-08 (×3): qty 1

## 2017-05-08 MED ORDER — ONDANSETRON HCL 4 MG/2ML IJ SOLN
INTRAMUSCULAR | Status: DC | PRN
  Administered 2017-05-08: 4 mg via INTRAVENOUS

## 2017-05-08 MED ORDER — MENTHOL 3 MG MT LOZG: 1.0000 | LOZENGE | OROMUCOSAL | Status: DC | PRN

## 2017-05-08 MED ORDER — DEXAMETHASONE SODIUM PHOSPHATE 10 MG/ML IJ SOLN
INTRAMUSCULAR | Status: AC
  Filled 2017-05-08: qty 1

## 2017-05-08 MED ORDER — METOCLOPRAMIDE HCL 5 MG PO TABS: 5.0000 mg | ORAL_TABLET | Freq: Three times a day (TID) | ORAL | Status: DC | PRN

## 2017-05-08 MED ORDER — MAGNESIUM CITRATE PO SOLN: 1.0000 | Freq: Once | ORAL | Status: DC | PRN

## 2017-05-08 MED ORDER — CEFAZOLIN SODIUM-DEXTROSE 2-4 GM/100ML-% IV SOLN
2.0000 g | Freq: Four times a day (QID) | INTRAVENOUS | Status: AC
  Administered 2017-05-08 – 2017-05-09 (×2): 2 g via INTRAVENOUS
  Filled 2017-05-08 (×2): qty 100

## 2017-05-08 MED ORDER — METOCLOPRAMIDE HCL 5 MG/ML IJ SOLN: 5.0000 mg | Freq: Three times a day (TID) | INTRAMUSCULAR | Status: DC | PRN

## 2017-05-08 MED ORDER — BUPIVACAINE-EPINEPHRINE 0.25% -1:200000 IJ SOLN
INTRAMUSCULAR | Status: AC
  Filled 2017-05-08: qty 1

## 2017-05-08 MED ORDER — ONDANSETRON HCL 4 MG/2ML IJ SOLN: 4.0000 mg | Freq: Four times a day (QID) | INTRAMUSCULAR | Status: DC | PRN

## 2017-05-08 MED ORDER — LIDOCAINE 2% (20 MG/ML) 5 ML SYRINGE
INTRAMUSCULAR | Status: AC
Start: 2017-05-08 — End: ?
  Filled 2017-05-08: qty 5

## 2017-05-08 MED ORDER — DOCUSATE SODIUM 100 MG PO CAPS
100.0000 mg | ORAL_CAPSULE | Freq: Two times a day (BID) | ORAL | Status: DC
  Administered 2017-05-08 – 2017-05-10 (×4): 100 mg via ORAL
  Filled 2017-05-08 (×4): qty 1

## 2017-05-08 MED ORDER — ACETAMINOPHEN 10 MG/ML IV SOLN
INTRAVENOUS | Status: AC
  Filled 2017-05-08: qty 100

## 2017-05-08 MED ORDER — LIDOCAINE HCL (CARDIAC) 20 MG/ML IV SOLN
INTRAVENOUS | Status: DC | PRN
  Administered 2017-05-08: 40 mg via INTRAVENOUS

## 2017-05-08 MED ORDER — DIPHENHYDRAMINE HCL 12.5 MG/5ML PO ELIX: 12.5000 mg | ORAL_SOLUTION | ORAL | Status: DC | PRN

## 2017-05-08 MED ORDER — CARVEDILOL 12.5 MG PO TABS
12.5000 mg | ORAL_TABLET | Freq: Two times a day (BID) | ORAL | Status: DC
  Administered 2017-05-08 – 2017-05-10 (×3): 12.5 mg via ORAL
  Filled 2017-05-08 (×4): qty 1

## 2017-05-08 MED ORDER — ACETAMINOPHEN 10 MG/ML IV SOLN
1000.0000 mg | Freq: Once | INTRAVENOUS | Status: AC
  Administered 2017-05-08: 1000 mg via INTRAVENOUS

## 2017-05-08 MED ORDER — PROPOFOL 500 MG/50ML IV EMUL
INTRAVENOUS | Status: DC | PRN
  Administered 2017-05-08: 100 ug/kg/min via INTRAVENOUS
  Administered 2017-05-08: 15:00:00 via INTRAVENOUS

## 2017-05-08 MED ORDER — CEFAZOLIN SODIUM-DEXTROSE 2-4 GM/100ML-% IV SOLN
2.0000 g | INTRAVENOUS | Status: AC
  Administered 2017-05-08: 2 g via INTRAVENOUS

## 2017-05-08 MED ORDER — ALUM & MAG HYDROXIDE-SIMETH 200-200-20 MG/5ML PO SUSP: 30.0000 mL | ORAL | Status: DC | PRN

## 2017-05-08 MED ORDER — LISINOPRIL 20 MG PO TABS
20.0000 mg | ORAL_TABLET | Freq: Every day | ORAL | Status: DC
Start: 2017-05-08 — End: 2017-05-10
  Administered 2017-05-08: 20 mg via ORAL
  Filled 2017-05-08 (×2): qty 1

## 2017-05-08 MED ORDER — ACETAMINOPHEN 10 MG/ML IV SOLN
1000.0000 mg | Freq: Four times a day (QID) | INTRAVENOUS | Status: AC
  Administered 2017-05-08 – 2017-05-09 (×4): 1000 mg via INTRAVENOUS
  Filled 2017-05-08 (×4): qty 100

## 2017-05-08 MED ORDER — METHOCARBAMOL 1000 MG/10ML IJ SOLN: 500.0000 mg | Freq: Four times a day (QID) | INTRAMUSCULAR | Status: DC | PRN

## 2017-05-08 MED ORDER — ROPIVACAINE HCL 5 MG/ML IJ SOLN
INTRAMUSCULAR | Status: DC | PRN
  Administered 2017-05-08: 30 mL via PERINEURAL

## 2017-05-08 MED ORDER — BUPIVACAINE IN DEXTROSE 0.75-8.25 % IT SOLN
INTRATHECAL | Status: DC | PRN
  Administered 2017-05-08: 2 mL via INTRATHECAL

## 2017-05-08 MED ORDER — ONDANSETRON HCL 4 MG/2ML IJ SOLN
INTRAMUSCULAR | Status: AC
  Filled 2017-05-08: qty 2

## 2017-05-08 MED ORDER — 0.9 % SODIUM CHLORIDE (POUR BTL) OPTIME
TOPICAL | Status: DC | PRN
  Administered 2017-05-08: 1000 mL

## 2017-05-08 MED ORDER — KETOROLAC TROMETHAMINE 15 MG/ML IJ SOLN
15.0000 mg | Freq: Four times a day (QID) | INTRAMUSCULAR | Status: AC
  Administered 2017-05-08 – 2017-05-09 (×4): 15 mg via INTRAVENOUS
  Filled 2017-05-08 (×4): qty 1

## 2017-05-08 MED ORDER — ONDANSETRON HCL 4 MG/2ML IJ SOLN
INTRAMUSCULAR | Status: AC
Start: 2017-05-08 — End: ?
  Filled 2017-05-08: qty 2

## 2017-05-08 MED ORDER — INSULIN ASPART 100 UNIT/ML ~~LOC~~ SOLN
0.0000 [IU] | Freq: Three times a day (TID) | SUBCUTANEOUS | Status: DC
  Administered 2017-05-09 (×3): 3 [IU] via SUBCUTANEOUS
  Administered 2017-05-10: 2 [IU] via SUBCUTANEOUS

## 2017-05-08 MED ORDER — MEPERIDINE HCL 25 MG/ML IJ SOLN: 6.2500 mg | INTRAMUSCULAR | Status: DC | PRN

## 2017-05-08 MED ORDER — DEXAMETHASONE SODIUM PHOSPHATE 10 MG/ML IJ SOLN
INTRAMUSCULAR | Status: DC | PRN
  Administered 2017-05-08: 10 mg via INTRAVENOUS

## 2017-05-08 MED ORDER — POLYETHYLENE GLYCOL 3350 17 G PO PACK: 17.0000 g | PACK | Freq: Every day | ORAL | Status: DC | PRN

## 2017-05-08 SURGICAL SUPPLY — 60 items
BAG DECANTER FOR FLEXI CONT (MISCELLANEOUS) ×3 IMPLANT
BANDAGE ESMARK 6X9 LF (GAUZE/BANDAGES/DRESSINGS) ×1 IMPLANT
BLADE SAGITTAL 25.0X1.19X90 (BLADE) ×2 IMPLANT
BLADE SAGITTAL 25.0X1.19X90MM (BLADE) ×1
BLADE SURG 10 STRL SS (BLADE) ×6 IMPLANT
BNDG ESMARK 6X9 LF (GAUZE/BANDAGES/DRESSINGS) ×3
BOWL SMART MIX CTS (DISPOSABLE) ×3 IMPLANT
CAP KNEE TOTAL 3 SIGMA ×3 IMPLANT
CEMENT HV SMART SET (Cement) ×6 IMPLANT
COVER SURGICAL LIGHT HANDLE (MISCELLANEOUS) ×3 IMPLANT
CUFF TOURNIQUET SINGLE 34IN LL (TOURNIQUET CUFF) ×3 IMPLANT
CUFF TOURNIQUET SINGLE 44IN (TOURNIQUET CUFF) IMPLANT
DECANTER SPIKE VIAL GLASS SM (MISCELLANEOUS) ×3 IMPLANT
DRAPE EXTREMITY T 121X128X90 (DRAPE) IMPLANT
DRAPE HALF SHEET 40X57 (DRAPES) ×3 IMPLANT
DRSG ADAPTIC 3X8 NADH LF (GAUZE/BANDAGES/DRESSINGS) ×3 IMPLANT
DRSG PAD ABDOMINAL 8X10 ST (GAUZE/BANDAGES/DRESSINGS) ×3 IMPLANT
DURAPREP 26ML APPLICATOR (WOUND CARE) ×6 IMPLANT
ELECT CAUTERY BLADE 6.4 (BLADE) ×3 IMPLANT
ELECT REM PT RETURN 9FT ADLT (ELECTROSURGICAL) ×3
ELECTRODE REM PT RTRN 9FT ADLT (ELECTROSURGICAL) ×1 IMPLANT
EVACUATOR 1/8 PVC DRAIN (DRAIN) ×3 IMPLANT
FACESHIELD WRAPAROUND (MASK) ×6 IMPLANT
GAUZE SPONGE 4X4 12PLY STRL (GAUZE/BANDAGES/DRESSINGS) ×3 IMPLANT
GLOVE BIOGEL PI IND STRL 8 (GLOVE) ×1 IMPLANT
GLOVE BIOGEL PI IND STRL 8.5 (GLOVE) ×1 IMPLANT
GLOVE BIOGEL PI INDICATOR 8 (GLOVE) ×2
GLOVE BIOGEL PI INDICATOR 8.5 (GLOVE) ×2
GLOVE ECLIPSE 8.0 STRL XLNG CF (GLOVE) ×6 IMPLANT
GLOVE SURG ORTHO 8.5 STRL (GLOVE) ×6 IMPLANT
GOWN STRL REUS W/ TWL LRG LVL3 (GOWN DISPOSABLE) ×2 IMPLANT
GOWN STRL REUS W/TWL 2XL LVL3 (GOWN DISPOSABLE) ×3 IMPLANT
GOWN STRL REUS W/TWL LRG LVL3 (GOWN DISPOSABLE) ×4
HANDPIECE INTERPULSE COAX TIP (DISPOSABLE) ×2
KIT BASIN OR (CUSTOM PROCEDURE TRAY) ×3 IMPLANT
KIT ROOM TURNOVER OR (KITS) ×3 IMPLANT
MANIFOLD NEPTUNE II (INSTRUMENTS) ×3 IMPLANT
NEEDLE 22X1 1/2 (OR ONLY) (NEEDLE) ×3 IMPLANT
NS IRRIG 1000ML POUR BTL (IV SOLUTION) ×3 IMPLANT
PACK TOTAL JOINT (CUSTOM PROCEDURE TRAY) ×3 IMPLANT
PAD ARMBOARD 7.5X6 YLW CONV (MISCELLANEOUS) ×6 IMPLANT
PAD CAST 4YDX4 CTTN HI CHSV (CAST SUPPLIES) ×1 IMPLANT
PADDING CAST COTTON 4X4 STRL (CAST SUPPLIES) ×2
PADDING CAST COTTON 6X4 STRL (CAST SUPPLIES) ×3 IMPLANT
SET HNDPC FAN SPRY TIP SCT (DISPOSABLE) ×1 IMPLANT
STAPLER VISISTAT 35W (STAPLE) ×3 IMPLANT
SUCTION FRAZIER HANDLE 10FR (MISCELLANEOUS) ×2
SUCTION TUBE FRAZIER 10FR DISP (MISCELLANEOUS) ×1 IMPLANT
SURGIFLO W/THROMBIN 8M KIT (HEMOSTASIS) IMPLANT
SUT BONE WAX W31G (SUTURE) ×3 IMPLANT
SUT ETHIBOND NAB CT1 #1 30IN (SUTURE) ×6 IMPLANT
SUT MNCRL AB 3-0 PS2 18 (SUTURE) ×3 IMPLANT
SUT VIC AB 0 CT1 27 (SUTURE) ×2
SUT VIC AB 0 CT1 27XBRD ANBCTR (SUTURE) ×1 IMPLANT
SYR CONTROL 10ML LL (SYRINGE) ×3 IMPLANT
TOWEL OR 17X24 6PK STRL BLUE (TOWEL DISPOSABLE) ×3 IMPLANT
TOWEL OR 17X26 10 PK STRL BLUE (TOWEL DISPOSABLE) ×3 IMPLANT
TRAY FOLEY BAG SILVER LF 16FR (SET/KITS/TRAYS/PACK) ×3 IMPLANT
WRAP KNEE MAXI GEL POST OP (GAUZE/BANDAGES/DRESSINGS) ×3 IMPLANT
YANKAUER SUCT BULB TIP NO VENT (SUCTIONS) ×3 IMPLANT

## 2017-05-08 NOTE — Anesthesia Procedure Notes (Signed)
Spinal  Patient location during procedure: OR Start time: 05/08/2017 1:13 PM End time: 05/08/2017 1:16 PM Staffing Anesthesiologist: Suella Broad D Performed: anesthesiologist  Preanesthetic Checklist Completed: patient identified, site marked, surgical consent, pre-op evaluation, timeout performed, IV checked, risks and benefits discussed and monitors and equipment checked Spinal Block Patient position: sitting Prep: Betadine Patient monitoring: heart rate, continuous pulse ox, blood pressure and cardiac monitor Approach: midline Location: L4-5 Injection technique: single-shot Needle Needle type: Introducer and Pencan  Needle gauge: 24 G Needle length: 9 cm Additional Notes Negative paresthesia. Negative blood return. Positive free-flowing CSF. Expiration date of kit checked and confirmed. Patient tolerated procedure well, without complications.

## 2017-05-08 NOTE — Transfer of Care (Signed)
Immediate Anesthesia Transfer of Care Note  Patient: Darius Rogers  Procedure(s) Performed: Procedure(s): RIGHT TOTAL KNEE ARTHROPLASTY (Right)  Patient Location: PACU  Anesthesia Type:Spinal and MAC combined with regional for post-op pain  Level of Consciousness: awake, alert  and oriented  Airway & Oxygen Therapy: Patient Spontanous Breathing  Post-op Assessment: Report given to RN and Post -op Vital signs reviewed and stable  Post vital signs: Reviewed and stable  Last Vitals:  Vitals:   05/08/17 1130 05/08/17 1540  BP: 112/62 (!) 93/59  Pulse:  61  Resp:  12  Temp:  (!) 36.3 C    Last Pain:  Vitals:   05/08/17 1540  TempSrc:   PainSc: 0-No pain         Complications: No apparent anesthesia complications

## 2017-05-08 NOTE — Anesthesia Procedure Notes (Signed)
Date/Time: 05/08/2017 1:20 PM Performed by: Izora Gala Pre-anesthesia Checklist: Patient identified, Suction available, Emergency Drugs available and Patient being monitored Patient Re-evaluated:Patient Re-evaluated prior to induction Oxygen Delivery Method: Simple face mask Induction Type: IV induction Ventilation: Oral airway inserted - appropriate to patient size Placement Confirmation: positive ETCO2

## 2017-05-08 NOTE — Op Note (Signed)
NAME:  Darius Rogers, Darius Rogers                    ACCOUNT NO.:  MEDICAL RECORD NO.:  [id number]  LOCATION:                                 FACILITY:  PHYSICIAN:  Vonna Kotyk. Jalayla Chrismer, M.D.DATE OF BIRTH:  [birth year]  DATE OF PROCEDURE:  05/08/2017 DATE OF DISCHARGE:                              OPERATIVE REPORT   PREOPERATIVE DIAGNOSIS:  End-stage osteoarthritis, right knee.  POSTOPERATIVE DIAGNOSIS:  End-stage osteoarthritis, right knee.  PROCEDURE:  Right total knee replacement.  SURGEON:  Vonna Kotyk. Durward Fortes, M.D.  ASSISTANT:  Biagio Borg, PA-C.  ANESTHESIA:  Spinal with IV sedation.  COMPLICATIONS:  None.  COMPONENTS:  DePuy LCS large femoral component, a #5 rotating keeled tibial tray with a 10-mm polyethylene bridging bearing.  A metal back 3- peg rotating patella.  Components were secured with polymethylmethacrylate.  DESCRIPTION OF PROCEDURE:  Darius Rogers was met in the holding area, identified the right knee as appropriate operative site and marked it accordingly.  The patient was then transferred to room #7 and placed under spinal anesthesia without difficulty.  He was placed supine on the operating room table.  Nursing staff inserted a Foley catheter.  Urine was clear.  The right lower extremity was then placed in a thigh tourniquet.  The leg was prepped with chlorhexidine scrub and DuraPrep x2 from the tourniquet to the tips of the toes.  A sterile draping was performed.  A time-out was called.  The right lower extremity was then elevated and Esmarch exsanguinated with a proximal tourniquet at 350 mmHg.  A midline longitudinal incision was made centered about the patella extending from the superior pouch to tibial tubercle via sharp dissection and the incision carried down through subcutaneous tissue. First layer of capsule was incised in the midline.  A medial parapatellar incision was then made with the Bovie.  The joint was entered.  There was a clear yellow  joint effusion of about 15 mL.  The patella was everted to 180 degrees laterally and the knee flexed to 90 degrees.  The knee was in varus, but I could correct it to neutral. There was complete absence of articular cartilage of the medial femoral condyle and medial tibial plateau.  Large osteophytes were identified along the medial and lateral femoral condyle and medial tibial plateau. These were removed.  Synovectomy was performed.  I measured a large femoral component.  First, bony cut was made transversely in the proximal tibia using the external tibial guide with a 7-degree angle of declination.  After each bony cut on the tibia and femur, I checked my alignment with the external guide.  Subsequent cuts were then made on the femur using the large femoral jig. I used a 4-degree distal femoral valgus cut.  Lamina spreaders were inserted along the medial and lateral compartments.  I removed medial and lateral menisci, ACL, and PCL.  One small loose body was removed measuring about 5 x 5 mm from the posteromedial compartment.  I removed osteophytes in the posterior femoral condyle using a 3/4-inch curved osteotome.  There was a small Baker cyst.  The visual portion of the cyst was excised.  MCL and LCL remained  intact throughout the procedure. Flexion and extension gaps were perfectly symmetrical at 10 mm.  The final cut was then made on the femur for tapering purposes and to obtain the center hole.  Retractor was then placed about the tibia, was advanced anteriorly, and measured a #5 tibial tray.  This was pinned in place.  The center hole was then made followed by the keeled cut.  With the tibial jig in place, a 10 mm polyethylene bridging bearing trial was inserted followed by the large femoral component.  This was reduced and through a full range of motion remained perfectly stable.  The patella was then prepared by removing approximately 12 mm of bone leaving about 13 mm of  patellar thickness.  Three holes were then made. Trial patella inserted and reduced through a full range of motion and was stable.  Trial components were removed.  The joint was copiously irrigated with saline solution.  The final components were then impacted with polymethylmethacrylate under compression.  I used a trial polyethylene bridging bearing until the methacrylate matured.  During that time, we injected the joint and capsule with 0.25% Marcaine with epinephrine.  At approximately 16 minutes, the methacrylate had matured.  We removed the trial components and removed any further hardened methacrylate from the femur and the tibia.  The final 10 mm polyethylene bridging bearing was then inserted.  The tourniquet was deflated at 76 minutes.  We had nice bleeding to the joint surface.  We applied tranexamic acid topically and under compression for about 5 minutes.  A medium-sized Hemovac was inserted.  The deep capsule was then closed with a running #1 Vicryl, the superficial capsule closed with a running 0 Vicryl, subcu with 2-0 Vicryl and 3-0 Monocryl.  Skin was closed with skin clips.  A sterile bulky dressing was applied followed by the patient's support stocking.  DISPOSITION:  The patient was then transported to the operating room stretcher and returned to the postanesthesia recovery room in satisfactory condition without problems.     Vonna Kotyk. Durward Fortes, M.D.     PWW/MEDQ  D:  05/08/2017  T:  05/08/2017  Job:  [id number]

## 2017-05-08 NOTE — H&P (Signed)
The recent History & Physical has been reviewed. I have personally examined the patient today. There is no interval change to the documented History & Physical. The patient would like to proceed with the procedure.  Garald Balding 05/08/2017,  12:30 PM

## 2017-05-08 NOTE — Op Note (Signed)
PATIENT ID:      Darius Rogers  MRN:     [id number] DOB/AGE:    1956/09/23 / 61 y.o.       OPERATIVE REPORT    DATE OF PROCEDURE:  05/08/2017       PREOPERATIVE DIAGNOSIS:END STAGE   RIGHT KNEE OSTEOARTHRITIS                                                       Estimated body mass index is 31.6 kg/m as calculated from the following:   Height as of 05/01/17: 6' (1.829 m).   Weight as of this encounter: 233 lb (105.7 kg).     POSTOPERATIVE DIAGNOSIS:END STAGE   RIGHT KNEE OSTEOARTHRITIS                                                                     Estimated body mass index is 31.6 kg/m as calculated from the following:   Height as of 05/01/17: 6' (1.829 m).   Weight as of this encounter: 233 lb (105.7 kg).     PROCEDURE:  Procedure(s): RIGHT TOTAL KNEE ARTHROPLASTY     SURGEON:  Joni Fears, MD    ASSISTANT:   Biagio Borg, PA-C   (Present and scrubbed throughout the case, critical for assistance with exposure, retraction, instrumentation, and closure.)          ANESTHESIA: spinal and IV sedation     DRAINS: HEMOVAC, CLAMPED RIGHT KNEE    TOURNIQUET TIME:  Total Tourniquet Time Documented: Calf (Right) - 76 minutes Total: Calf (Right) - 76 minutes     COMPLICATIONS:  None   CONDITION:  stable  PROCEDURE IN [id number]  Darius Rogers 05/08/2017, 3:10 PM

## 2017-05-08 NOTE — Anesthesia Preprocedure Evaluation (Addendum)
Anesthesia Evaluation  Patient identified by MRN, date of birth, ID band Patient awake    Reviewed: Allergy & Precautions, NPO status , Patient's Chart, lab work & pertinent test results  Airway Mallampati: II  TM Distance: >3 FB Neck ROM: Full    Dental  (+) Teeth Intact, Dental Advisory Given   Pulmonary neg pulmonary ROS, former smoker,    breath sounds clear to auscultation       Cardiovascular hypertension, Pt. on home beta blockers and Pt. on medications + CAD and + Past MI  negative cardio ROS   Rhythm:Regular Rate:Normal     Neuro/Psych negative neurological ROS     GI/Hepatic negative GI ROS, Neg liver ROS,   Endo/Other  diabetes, Type 2, Oral Hypoglycemic Agents  Renal/GU negative Renal ROS     Musculoskeletal  (+) Arthritis , Osteoarthritis,    Abdominal   Peds  Hematology negative hematology ROS (+)   Anesthesia Other Findings Day of surgery medications reviewed with the patient.  Reproductive/Obstetrics                            Lab Results  Component Value Date   WBC 11.8 (H) 04/26/2017   HGB 16.3 04/26/2017   HCT 49.0 04/26/2017   MCV 90.2 04/26/2017   PLT 251 04/26/2017   Lab Results  Component Value Date   CREATININE 1.04 04/26/2017   BUN 9 04/26/2017   NA 138 04/26/2017   K 4.2 04/26/2017   CL 105 04/26/2017   CO2 23 04/26/2017   Lab Results  Component Value Date   INR 1.03 04/26/2017   EKG: normal sinus rhythm.  Anesthesia Physical Anesthesia Plan  ASA: II  Anesthesia Plan: Spinal   Post-op Pain Management:  Regional for Post-op pain   Induction: Intravenous  PONV Risk Score and Plan: 2 and Ondansetron and Dexamethasone  Airway Management Planned: Nasal Cannula  Additional Equipment:   Intra-op Plan:   Post-operative Plan:   Informed Consent: I have reviewed the patients History and Physical, chart, labs and discussed the procedure  including the risks, benefits and alternatives for the proposed anesthesia with the patient or authorized representative who has indicated his/her understanding and acceptance.     Plan Discussed with: CRNA  Anesthesia Plan Comments:         Anesthesia Quick Evaluation

## 2017-05-08 NOTE — Anesthesia Procedure Notes (Signed)
Anesthesia Regional Block: Adductor canal block   Pre-Anesthetic Checklist: ,, timeout performed, Correct Patient, Correct Site, Correct Laterality, Correct Procedure, Correct Position, site marked, Risks and benefits discussed,  Surgical consent,  Pre-op evaluation,  At surgeon's request and post-op pain management  Laterality: Right  Prep: chloraprep       Needles:  Injection technique: Single-shot  Needle Type: Echogenic Needle     Needle Length: 9cm  Needle Gauge: 21     Additional Needles:   Procedures: ultrasound guided,,,,,,,,  Narrative:  Start time: 05/08/2017 11:20 AM End time: 05/08/2017 11:25 AM Injection made incrementally with aspirations every 5 mL.  Performed by: Personally  Anesthesiologist: Suella Broad D  Additional Notes: Tolerated well. No issues.

## 2017-05-08 NOTE — Progress Notes (Signed)
Orthopedic Tech Progress Note Patient Details:  Darius Rogers 1956/02/06 [id number]  CPM Right Knee CPM Right Knee: On Right Knee Flexion (Degrees): 90 Right Knee Extension (Degrees): 0   Darius Rogers 05/08/2017, 4:11 PM

## 2017-05-08 NOTE — Anesthesia Postprocedure Evaluation (Signed)
Anesthesia Post Note  Patient: Darius Rogers  Procedure(s) Performed: Procedure(s) (LRB): RIGHT TOTAL KNEE ARTHROPLASTY (Right)     Patient location during evaluation: PACU Anesthesia Type: Spinal Level of consciousness: oriented and awake and alert Pain management: pain level controlled Vital Signs Assessment: post-procedure vital signs reviewed and stable Respiratory status: spontaneous breathing, respiratory function stable and patient connected to nasal cannula oxygen Cardiovascular status: blood pressure returned to baseline and stable Postop Assessment: no headache, no backache, spinal receding and patient able to bend at knees Anesthetic complications: no    Last Vitals:  Vitals:   05/08/17 1725 05/08/17 1740  BP: 117/75 112/79  Pulse: (!) 48 (!) 49  Resp: 13 13  Temp:  36.6 C    Last Pain:  Vitals:   05/08/17 1740  TempSrc:   PainSc: 0-No pain                 Effie Berkshire

## 2017-05-09 ENCOUNTER — Encounter (HOSPITAL_COMMUNITY): Payer: Self-pay | Admitting: Orthopaedic Surgery

## 2017-05-09 LAB — CBC
HCT: 41.5 % (ref 39.0–52.0)
Hemoglobin: 13.5 g/dL (ref 13.0–17.0)
MCH: 28.7 pg (ref 26.0–34.0)
MCHC: 32.5 g/dL (ref 30.0–36.0)
MCV: 88.1 fL (ref 78.0–100.0)
PLATELETS: 267 10*3/uL (ref 150–400)
RBC: 4.71 MIL/uL (ref 4.22–5.81)
RDW: 13.4 % (ref 11.5–15.5)
WBC: 18.7 10*3/uL — AB (ref 4.0–10.5)

## 2017-05-09 LAB — GLUCOSE, CAPILLARY
GLUCOSE-CAPILLARY: 156 mg/dL — AB (ref 65–99)
GLUCOSE-CAPILLARY: 179 mg/dL — AB (ref 65–99)
Glucose-Capillary: 156 mg/dL — ABNORMAL HIGH (ref 65–99)
Glucose-Capillary: 143 mg/dL — ABNORMAL HIGH (ref 65–99)

## 2017-05-09 LAB — BASIC METABOLIC PANEL
ANION GAP: 7 (ref 5–15)
BUN: 15 mg/dL (ref 6–20)
CO2: 24 mmol/L (ref 22–32)
Calcium: 8.5 mg/dL — ABNORMAL LOW (ref 8.9–10.3)
Chloride: 105 mmol/L (ref 101–111)
Creatinine, Ser: 1.05 mg/dL (ref 0.61–1.24)
GLUCOSE: 215 mg/dL — AB (ref 65–99)
POTASSIUM: 4.1 mmol/L (ref 3.5–5.1)
SODIUM: 136 mmol/L (ref 135–145)

## 2017-05-09 LAB — HEMOGLOBIN A1C
HEMOGLOBIN A1C: 6.8 % — AB (ref 4.8–5.6)
MEAN PLASMA GLUCOSE: 148 mg/dL

## 2017-05-09 NOTE — Progress Notes (Signed)
PATIENT ID: Darius Rogers        MRN:  [id number]          DOB/AGE: [birth year] / 61 y.o.    Joni Fears, MD   Biagio Borg, PA-C [address] [place],   [address]                             [phone]   PROGRESS NOTE  Subjective:  negative for Chest Pain  negative for Shortness of Breath  negative for Nausea/Vomiting   negative for Calf Pain    Tolerating Diet: yes         Patient reports pain as mild.     Comfortable night  Objective: Vital signs in last 24 hours:   Patient Vitals for the past 24 hrs:  BP Temp Temp src Pulse Resp SpO2 Weight  05/09/17 0446 (!) 114/59 98 F (36.7 C) Oral 65 18 94 % -  05/09/17 0052 117/65 98 F (36.7 C) Oral 71 18 95 % -  05/08/17 2020 (!) 122/59 98.1 F (36.7 C) Oral (!) 57 18 96 % -  05/08/17 1800 127/80 (!) 97.4 F (36.3 C) Oral (!) 51 - 97 % -  05/08/17 1740 112/79 97.8 F (36.6 C) - (!) 49 13 96 % -  05/08/17 1725 117/75 - - (!) 48 13 97 % -  05/08/17 1710 112/72 - - (!) 48 12 96 % -  05/08/17 1655 104/71 - - (!) 44 12 98 % -  05/08/17 1640 107/72 - - (!) 48 11 96 % -  05/08/17 1625 107/70 - - (!) 49 15 96 % -  05/08/17 1610 104/63 - - (!) 51 13 96 % -  05/08/17 1555 (!) 95/59 - - (!) 58 16 90 % -  05/08/17 1540 (!) 93/59 (!) 97.3 F (36.3 C) - 61 12 93 % -  05/08/17 1130 112/62 - - - - - -  05/08/17 1049 - - - - - - 233 lb (105.7 kg)  05/08/17 1045 113/71 98.1 F (36.7 C) Oral 63 18 96 % -      Intake/Output from previous day:   07/31 0701 - 08/01 0700 In: 2377.5 [I.V.:2177.5] Out: 2515 [Urine:2475]   Intake/Output this shift:   No intake/output data recorded.   Intake/Output      07/31 0701 - 08/01 0700 08/01 0701 - 08/02 0700   I.V. (mL/kg) 2177.5 (20.6)    IV Piggyback 200    Total Intake(mL/kg) 2377.5 (22.5)    Urine (mL/kg/hr) 2475    Drains 0    Blood 40    Total Output 2515     Net -137.5             LABORATORY DATA:  Recent Labs  05/09/17 0344  WBC 18.7*  HGB 13.5  HCT 41.5    PLT 267    Recent Labs  05/09/17 0344  NA 136  K 4.1  CL 105  CO2 24  BUN 15  CREATININE 1.05  GLUCOSE 215*  CALCIUM 8.5*   Lab Results  Component Value Date   INR 1.03 04/26/2017    Recent Radiographic Studies :  Dg Chest 2 View  Result Date: 04/26/2017 CLINICAL DATA:  Pre admit for total rt knee Htn/DM,,,heart stent in 2012,,no chest comp today EXAM: CHEST  2 VIEW COMPARISON:  04/30/2015 FINDINGS: The heart size and mediastinal contours are within normal limits. Both lungs are clear.  No pleural effusion or pneumothorax. The visualized skeletal structures are unremarkable. IMPRESSION: No active cardiopulmonary disease. Electronically Signed   By: Lajean Manes M.D.   On: 04/26/2017 13:44     Examination:  General appearance: alert, cooperative and no distress  Wound Exam: clean, dry, intact   Drainage:  Scant/small amount Serosanguinous exudate  Motor Exam: EHL, FHL, Anterior Tibial and Posterior Tibial Intact  Sensory Exam: Superficial Peroneal, Deep Peroneal and Tibial normal  Vascular Exam: Normal  Assessment:    1 Day Post-Op  Procedure(s) (LRB): RIGHT TOTAL KNEE ARTHROPLASTY (Right)  ADDITIONAL DIAGNOSIS:  Active Problems:   Primary osteoarthritis of right knee   S/P TKR (total knee replacement) using cement, right     Plan: Physical Therapy as ordered Partial Weight Bearing @ 50% (PWB)  DVT Prophylaxis:  Xarelto, Foot Pumps and TED hose  DISCHARGE PLAN: Home  DISCHARGE NEEDS: HHPT, CPM, Walker and 3-in-1 comode seat OOB with PT, D/C hemovac, saline lock IV, hope for D/C tomorrow       Garald Balding  05/09/2017 7:22 AM  Patient ID: Darius Rogers, male   DOB: December 12, 1955, 61 y.o.   MRN: [id number]

## 2017-05-09 NOTE — Evaluation (Signed)
Occupational Therapy Evaluation Patient Details Name: Darius Rogers MRN: [id number] DOB: 01-02-1956 Today's Date: 05/09/2017    History of Present Illness 61 yo male s/p R TKA with 50% PWB   Clinical Impression   Patient is s/p R TKA surgery resulting in functional limitations due to the deficits listed below (see OT problem list). Pt is at adequate level to d/c home.  Patient will benefit from skilled OT acutely to increase independence and safety with ADLS to allow discharge home.     Follow Up Recommendations  No OT follow up    Equipment Recommendations  None recommended by OT    Recommendations for Other Services       Precautions / Restrictions Precautions Precautions: Fall Restrictions RLE Weight Bearing: Partial weight bearing RLE Partial Weight Bearing Percentage or Pounds: 50%      Mobility Bed Mobility Overal bed mobility: Modified Independent             General bed mobility comments: used a sheet to help elevate R LE  Transfers Overall transfer level: Modified independent                    Balance                                           ADL either performed or assessed with clinical judgement   ADL Overall ADL's : Independent                                       General ADL Comments: completed sink level adl, toilet transfer, bed transfer and LB dressing   Pt educated on bathing and avoid washing directly on incision. Pt educated to use new wash cloth and towel each day. Pt educated to allow water to run across dressing and not to soak in a tub at this time.     Vision Baseline Vision/History: Wears glasses Wears Glasses: At all times       Perception     Praxis      Pertinent Vitals/Pain Pain Assessment: Faces Faces Pain Scale: Hurts little more Pain Location: R knee Pain Descriptors / Indicators: Operative site guarding Pain Intervention(s): Monitored during session;Premedicated before  session;Repositioned     Hand Dominance Right   Extremity/Trunk Assessment Upper Extremity Assessment Upper Extremity Assessment: Overall WFL for tasks assessed   Lower Extremity Assessment Lower Extremity Assessment: Defer to PT evaluation   Cervical / Trunk Assessment Cervical / Trunk Assessment: Normal   Communication Communication Communication: No difficulties   Cognition Arousal/Alertness: Awake/alert Behavior During Therapy: WFL for tasks assessed/performed Overall Cognitive Status: Within Functional Limits for tasks assessed                                     General Comments  post op dressing on R LE at this time. educated on wet/dry bandage and bathing    Exercises Exercises: General Lower Extremity General Exercises - Lower Extremity Ankle Circles/Pumps: AROM;Right;10 reps;Seated Quad Sets: AROM;Right;10 reps;Seated   Shoulder Instructions      Home Living Family/patient expects to be discharged to:: Private residence Living Arrangements: Parent Available Help at Discharge: Family Type of Home: House Home Access: Level entry  Home Layout: One level     Bathroom Shower/Tub: Walk-in shower;Tub/shower unit (will use parents walk in shower)   Bathroom Toilet: Handicapped height     Home Equipment: None          Prior Functioning/Environment Level of Independence: Independent                 OT Problem List:        OT Treatment/Interventions:      OT Goals(Current goals can be found in the care plan section) Acute Rehab OT Goals Patient Stated Goal: to visit girlfriend  OT Goal Formulation: With patient  OT Frequency:     Barriers to D/C:            Co-evaluation              AM-PAC PT "6 Clicks" Daily Activity     Outcome Measure Help from another person eating meals?: None Help from another person taking care of personal grooming?: None Help from another person toileting, which includes using toliet,  bedpan, or urinal?: None Help from another person bathing (including washing, rinsing, drying)?: None Help from another person to put on and taking off regular upper body clothing?: None Help from another person to put on and taking off regular lower body clothing?: None 6 Click Score: 24   End of Session Equipment Utilized During Treatment: Rolling walker CPM Right Knee CPM Right Knee: Off (OT removed from machine) Nurse Communication: Mobility status;Precautions;Weight bearing status  Activity Tolerance: Patient tolerated treatment well Patient left: in chair;with call bell/phone within reach  OT Visit Diagnosis: Unsteadiness on feet (R26.81)                Time: 5409-8119 OT Time Calculation (min): 25 min Charges:  OT General Charges $OT Visit: 1 Procedure OT Evaluation $OT Eval Moderate Complexity: 1 Procedure OT Treatments $Self Care/Home Management : 8-22 mins G-Codes:      Jeri Modena   OTR/L Pager: [phone] Office: [phone] .   Parke Poisson B 05/09/2017, 10:23 AM

## 2017-05-09 NOTE — Evaluation (Signed)
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 Prior to admission, pt lived with his parents in a single level home with a level entry. Pt was completely independent. Pt is able to perform transfers and gait with supervision and bed mobs with Mod I. Pt will benefit from at least 1 additional PT visit in order to initiate HEP and progress gait prior to DC.     Follow Up Recommendations DC plan and follow up therapy as arranged by surgeon    Equipment Recommendations  None recommended by PT    Recommendations for Other Services       Precautions / Restrictions Precautions Precautions: Knee Precaution Booklet Issued: Yes (comment) Precaution Comments: handout given, reviewed no pillow under knee.  Restrictions Weight Bearing Restrictions: Yes RLE Weight Bearing: Partial weight bearing RLE Partial Weight Bearing Percentage or Pounds: 50%      Mobility  Bed Mobility Overal bed mobility: Modified Independent             General bed mobility comments: able to get into bed and assist with positioning RLE in CPM  Transfers Overall transfer level: Needs assistance Equipment used: Rolling walker (2 wheeled) Transfers: Sit to/from Stand Sit to Stand: Supervision         General transfer comment: supervision for safety from recliner  Ambulation/Gait Ambulation/Gait assistance: Supervision Ambulation Distance (Feet): 500 Feet Assistive device: Rolling walker (2 wheeled) Gait Pattern/deviations: Step-through pattern;Decreased step length - left;Decreased stance time - right;Antalgic Gait velocity: decreased Gait velocity interpretation: Below normal speed for age/gender General Gait Details: mild antalgic gait, good adherence to weight  bearing restrictions  Stairs            Wheelchair Mobility    Modified Rankin (Stroke Patients Only)       Balance Overall balance assessment: Needs assistance Sitting-balance support: No upper extremity supported;Feet supported Sitting balance-Leahy Scale: Normal     Standing balance support: Bilateral upper extremity supported;No upper extremity supported Standing balance-Leahy Scale: Fair Standing balance comment: able to stand briefly without RW                             Pertinent Vitals/Pain Pain Assessment: 0-10 Pain Score: 4  Faces Pain Scale: Hurts little more Pain Location: R knee Pain Descriptors / Indicators: Grimacing;Guarding;Sore Pain Intervention(s): Monitored during session;Premedicated before session;Repositioned;Ice applied    Home Living Family/patient expects to be discharged to:: Private residence Living Arrangements: Parent Available Help at Discharge: Family Type of Home: House Home Access: Level entry     Home Layout: One level Home Equipment: None      Prior Function Level of Independence: Independent               Hand Dominance   Dominant Hand: Right    Extremity/Trunk Assessment   Upper Extremity Assessment Upper Extremity Assessment: Defer to OT evaluation    Lower Extremity Assessment Lower Extremity Assessment: RLE deficits/detail RLE Deficits / Details: pt with normal post op pain and weakness. At least 3/5 grossly RLE    Cervical / Trunk Assessment Cervical / Trunk Assessment: Normal  Communication   Communication: No difficulties  Cognition Arousal/Alertness: Awake/alert Behavior During Therapy: WFL for tasks assessed/performed Overall Cognitive Status: Within  Functional Limits for tasks assessed                                        General Comments General comments (skin integrity, edema, etc.): post op dressing on R LE at this time. educated on wet/dry bandage and  bathing    Exercises Total Joint Exercises Ankle Circles/Pumps: AROM;Both;20 reps;Supine General Exercises - Lower Extremity Ankle Circles/Pumps: AROM;Right;10 reps;Seated Quad Sets: AROM;Right;10 reps;Seated   Assessment/Plan    PT Assessment Patient needs continued PT services  PT Problem List Decreased strength;Decreased range of motion;Decreased activity tolerance;Decreased balance;Decreased mobility;Decreased knowledge of use of DME;Pain       PT Treatment Interventions DME instruction;Gait training;Functional mobility training;Therapeutic activities;Therapeutic exercise;Balance training    PT Goals (Current goals can be found in the Care Plan section)  Acute Rehab PT Goals Patient Stated Goal: to visit girlfriend  PT Goal Formulation: With patient Time For Goal Achievement: 05/16/17 Potential to Achieve Goals: Good    Frequency 7X/week   Barriers to discharge        Co-evaluation               AM-PAC PT "6 Clicks" Daily Activity  Outcome Measure Difficulty turning over in bed (including adjusting bedclothes, sheets and blankets)?: None Difficulty moving from lying on back to sitting on the side of the bed? : None Difficulty sitting down on and standing up from a chair with arms (e.g., wheelchair, bedside commode, etc,.)?: A Little Help needed moving to and from a bed to chair (including a wheelchair)?: A Little Help needed walking in hospital room?: A Little Help needed climbing 3-5 steps with a railing? : A Little 6 Click Score: 20    End of Session Equipment Utilized During Treatment: Gait belt Activity Tolerance: Patient tolerated treatment well Patient left: in bed;in CPM;with call bell/phone within reach;with SCD's reapplied Nurse Communication: Mobility status;Other (comment) (pt ? DC tomorrow?) PT Visit Diagnosis: Muscle weakness (generalized) (M62.81);Difficulty in walking, not elsewhere classified (R26.2);Pain Pain - Right/Left: Right Pain - part  of body: Knee    Time: 2010-0712 PT Time Calculation (min) (ACUTE ONLY): 23 min   Charges:   PT Evaluation $PT Eval Low Complexity: 1 Low PT Treatments $Gait Training: 8-22 mins   PT G Codes:        Scheryl Marten PT, DPT  [phone]   Jacqulyn Liner Sloan Leiter 05/09/2017, 11:55 AM

## 2017-05-09 NOTE — Discharge Instructions (Signed)

## 2017-05-09 NOTE — Progress Notes (Signed)
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 Initiated supine exercises in HEP and instructed on safe performance. Pt is making steady progress toward goals and will benefit from 1 additional session prior to D/C in order to review HEP.    Follow Up Recommendations  DC plan and follow up therapy as arranged by surgeon     Equipment Recommendations  None recommended by PT    Recommendations for Other Services       Precautions / Restrictions Precautions Precautions: Knee Precaution Booklet Issued: Yes (comment) Precaution Comments: handout given, reviewed no pillow under knee.  Restrictions Weight Bearing Restrictions: Yes RLE Weight Bearing: Partial weight bearing RLE Partial Weight Bearing Percentage or Pounds: 50%    Mobility  Bed Mobility Overal bed mobility: Modified Independent             General bed mobility comments: able to get out of bed without assistance  Transfers Overall transfer level: Needs assistance Equipment used: Rolling walker (2 wheeled) Transfers: Sit to/from Stand Sit to Stand: Supervision         General transfer comment: supervision for safety from EOB  Ambulation/Gait Ambulation/Gait assistance: Supervision Ambulation Distance (Feet): 500 Feet Assistive device: Rolling walker (2 wheeled) Gait Pattern/deviations: Step-through pattern;Decreased step length - left;Decreased stance time - right;Antalgic Gait velocity: decreased Gait velocity interpretation: Below normal speed for age/gender General Gait Details: mild antalgic gait, good adherence to weight bearing restrictions   Stairs            Wheelchair Mobility    Modified Rankin (Stroke Patients  Only)       Balance Overall balance assessment: Needs assistance Sitting-balance support: No upper extremity supported;Feet supported Sitting balance-Leahy Scale: Normal     Standing balance support: Bilateral upper extremity supported;No upper extremity supported Standing balance-Leahy Scale: Fair Standing balance comment: able to stand briefly without RW                            Cognition Arousal/Alertness: Awake/alert Behavior During Therapy: WFL for tasks assessed/performed Overall Cognitive Status: Within Functional Limits for tasks assessed                                        Exercises Total Joint Exercises Ankle Circles/Pumps: AROM;Both;20 reps;Supine Quad Sets: AROM;Right;10 reps;Supine Heel Slides: AAROM;Right;10 reps;Supine Hip ABduction/ADduction: AROM;Right;10 reps;Supine Straight Leg Raises: AAROM;Right;10 reps;Supine Goniometric ROM: 0-92    General Comments        Pertinent Vitals/Pain Pain Assessment: 0-10 Pain Score: 5  Pain Location: R knee Pain Descriptors / Indicators: Grimacing;Guarding;Sore Pain Intervention(s): Monitored during session;Premedicated before session;Repositioned;Ice applied    Home Living                      Prior Function            PT Goals (current goals can now be found in the care plan section) Acute Rehab PT Goals Patient Stated Goal: to visit girlfriend  Progress towards PT goals: Progressing toward goals    Frequency    7X/week  PT Plan Current plan remains appropriate    Co-evaluation              AM-PAC PT "6 Clicks" Daily Activity  Outcome Measure  Difficulty turning over in bed (including adjusting bedclothes, sheets and blankets)?: None Difficulty moving from lying on back to sitting on the side of the bed? : None Difficulty sitting down on and standing up from a chair with arms (e.g., wheelchair, bedside commode, etc,.)?: A Little Help needed  moving to and from a bed to chair (including a wheelchair)?: A Little Help needed walking in hospital room?: A Little Help needed climbing 3-5 steps with a railing? : A Little 6 Click Score: 20    End of Session Equipment Utilized During Treatment: Gait belt Activity Tolerance: Patient tolerated treatment well Patient left: in chair;with call bell/phone within reach Nurse Communication: Mobility status PT Visit Diagnosis: Muscle weakness (generalized) (M62.81);Difficulty in walking, not elsewhere classified (R26.2);Pain Pain - Right/Left: Right Pain - part of body: Knee     Time: 1638-4536 PT Time Calculation (min) (ACUTE ONLY): 24 min  Charges:  $Gait Training: 8-22 mins $Therapeutic Exercise: 8-22 mins                    G Codes:       Darius Rogers PT, DPT  [phone]    Darius Rogers 05/09/2017, 4:06 PM

## 2017-05-09 NOTE — Plan of Care (Signed)
Problem: Education: Goal: Knowledge of [place] [facility] Education information/materials will improve Outcome: Progressing POC reviewed with pt.   

## 2017-05-10 LAB — CBC
HCT: 37.5 % — ABNORMAL LOW (ref 39.0–52.0)
HEMOGLOBIN: 12.2 g/dL — AB (ref 13.0–17.0)
MCH: 29 pg (ref 26.0–34.0)
MCHC: 32.5 g/dL (ref 30.0–36.0)
MCV: 89.1 fL (ref 78.0–100.0)
PLATELETS: 234 10*3/uL (ref 150–400)
RBC: 4.21 MIL/uL — AB (ref 4.22–5.81)
RDW: 13.9 % (ref 11.5–15.5)
WBC: 14.5 10*3/uL — ABNORMAL HIGH (ref 4.0–10.5)

## 2017-05-10 LAB — BASIC METABOLIC PANEL
Anion gap: 6 (ref 5–15)
BUN: 12 mg/dL (ref 6–20)
CO2: 25 mmol/L (ref 22–32)
Calcium: 8 mg/dL — ABNORMAL LOW (ref 8.9–10.3)
Chloride: 106 mmol/L (ref 101–111)
Creatinine, Ser: 1.03 mg/dL (ref 0.61–1.24)
GLUCOSE: 127 mg/dL — AB (ref 65–99)
POTASSIUM: 3.6 mmol/L (ref 3.5–5.1)
SODIUM: 137 mmol/L (ref 135–145)

## 2017-05-10 LAB — URINALYSIS, COMPLETE (UACMP) WITH MICROSCOPIC
BILIRUBIN URINE: NEGATIVE
GLUCOSE, UA: NEGATIVE mg/dL
KETONES UR: NEGATIVE mg/dL
LEUKOCYTES UA: NEGATIVE
Nitrite: NEGATIVE
PH: 6 (ref 5.0–8.0)
Protein, ur: NEGATIVE mg/dL
SQUAMOUS EPITHELIAL / LPF: NONE SEEN
Specific Gravity, Urine: 1.009 (ref 1.005–1.030)

## 2017-05-10 LAB — GLUCOSE, CAPILLARY: Glucose-Capillary: 147 mg/dL — ABNORMAL HIGH (ref 65–99)

## 2017-05-10 MED ORDER — METHOCARBAMOL 500 MG PO TABS: 500.0000 mg | ORAL_TABLET | Freq: Three times a day (TID) | ORAL | 0 refills | Status: DC | PRN

## 2017-05-10 MED ORDER — OXYCODONE HCL 5 MG PO TABS: 5.0000 mg | ORAL_TABLET | ORAL | 0 refills | Status: DC | PRN

## 2017-05-10 MED ORDER — RIVAROXABAN 10 MG PO TABS: 10.0000 mg | ORAL_TABLET | Freq: Every day | ORAL | 0 refills | Status: DC

## 2017-05-10 NOTE — Discharge Summary (Signed)
Darius Fears, MD   Darius Borg, PA-C [address] [place], [place]  [address]                             [phone]  PATIENT ID: Darius Rogers        MRN:  [id number]          DOB/AGE: 60-25-1957 / 61 y.o.    DISCHARGE SUMMARY  ADMISSION DATE:    05/08/2017 DISCHARGE DATE:   05/10/2017   ADMISSION DIAGNOSIS: RIGHT KNEE OSTEOARTHRITIS    DISCHARGE DIAGNOSIS:  RIGHT KNEE OSTEOARTHRITIS    ADDITIONAL DIAGNOSIS: Active Problems:   Primary osteoarthritis of right knee   S/P TKR (total knee replacement) using cement, right  Past Medical History:  Diagnosis Date  . Arthritis   . CAD (coronary artery disease)   . Constipation   . Diabetes mellitus without complication ([place])    Type II  . Heart attack ([place]) 2014  . Hyperlipemia   . Hypertension     PROCEDURE: Procedure(s): RIGHT TOTAL KNEE ARTHROPLASTY Right on 05/08/2017  CONSULTS: none    HISTORY: Patient is a 61 y.o. male presented with a history of pain in the right knee for 4 years. Onset of symptoms was abrupt starting 4 years ago with rapidly worsening course since that time. Prior procedures on the knee are none. Patient has been treated conservatively with over-the-counter NSAIDs and activity modification. Patient currently rates pain in the knee at 8 out of 10 with activity. There is pain at night. present.  Darius Rogers relates that he continues to have pain in both knees sometimes to the point of compromise. He is having more trouble on the right than the left. He did complete a series of Euflexxa injections in the fall of last year which made a "big difference". He is now aware that Medicaid no longer covers the [place]co supplementation. He did have a corticosteroid injection but it was not beneficial at all.    HOSPITAL COURSE:  Darius Rogers is a 61 y.o. admitted on 05/08/2017 and found to have a diagnosis of RIGHT KNEE OSTEOARTHRITIS.  After appropriate laboratory studies were obtained  they were taken to the  operating room on 05/08/2017 and underwent  Procedure(s): RIGHT TOTAL KNEE ARTHROPLASTY  Right.   They were given perioperative antibiotics:  Anti-infectives    Start     Dose/Rate Route Frequency Ordered Stop   05/08/17 1900  ceFAZolin (ANCEF) IVPB 2g/100 mL premix     2 g 200 mL/hr over 30 Minutes Intravenous Every 6 hours 05/08/17 1805 05/09/17 0230   05/08/17 1030  ceFAZolin (ANCEF) IVPB 2g/100 mL premix     2 g 200 mL/hr over 30 Minutes Intravenous On call to O.R. 05/08/17 1030 05/08/17 1310   05/08/17 1025  ceFAZolin (ANCEF) 2-4 GM/100ML-% IVPB    Comments:  Darius Rogers   : cabinet override      05/08/17 1025 05/08/17 1300    .  Tolerated the procedure well.  Placed with a foley intraoperatively.    Toradol was given post op.  POD #1, allowed out of bed to a chair.  PT for ambulation and exercise program.  Foley D/C'd in morning.  IV saline locked.  O2 discontionued. Hemovac pulled.  POD #2, continued PT and ambulation.  Hemovac pulled.  The remainder of the hospital course was dedicated to ambulation and strengthening.   The patient was discharged on 2 Days Post-Op in  Stable condition.  Blood products given:none  DIAGNOSTIC STUDIES: Recent vital signs: Patient Vitals for the past 24 hrs:  BP Temp Temp src Pulse Resp SpO2  05/10/17 0436 128/67 98.8 F (37.1 C) Oral 76 18 95 %  05/09/17 2229 - 99.1 F (37.3 C) - - - -  05/09/17 2047 111/82 99.7 F (37.6 C) Oral 81 18 93 %  05/09/17 1624 (!) 104/59 98.4 F (36.9 C) Oral 65 18 94 %       Recent laboratory studies:  Recent Labs  05/09/17 0344 05/10/17 0331  WBC 18.7* 14.5*  HGB 13.5 12.2*  HCT 41.5 37.5*  PLT 267 234    Recent Labs  05/09/17 0344 05/10/17 0331  NA 136 137  K 4.1 3.6  CL 105 106  CO2 24 25  BUN 15 12  CREATININE 1.05 1.03  GLUCOSE 215* 127*  CALCIUM 8.5* 8.0*   Lab Results  Component Value Date   INR 1.03 04/26/2017     Recent Radiographic Studies :  Dg Chest 2 View  Result  Date: 04/26/2017 CLINICAL DATA:  Pre admit for total rt knee Htn/DM,,,heart stent in 2012,,no chest comp today EXAM: CHEST  2 VIEW COMPARISON:  04/30/2015 FINDINGS: The heart size and mediastinal contours are within normal limits. Both lungs are clear. No pleural effusion or pneumothorax. The visualized skeletal structures are unremarkable. IMPRESSION: No active cardiopulmonary disease. Electronically Signed   By: Darius Rogers M.D.   On: 04/26/2017 13:44    DISCHARGE INSTRUCTIONS: Discharge Instructions    CPM    Complete by:  As directed    Continuous passive motion machine (CPM):      Use the CPM from 0 to 60 for 6-8 hours per day.      You may increase by 5-10 degrees per day.  You may break it up into 2 or 3 sessions per day.      Use CPM for 3-4  weeks or until you are told to stop.   Call MD / Call 911    Complete by:  As directed    If you experience chest pain or shortness of breath, CALL 911 and be transported to the hospital emergency room.  If you develope a fever above 101 F, pus (white drainage) or increased drainage or redness at the wound, or calf pain, call your surgeon's office.   Change dressing    Complete by:  As directed    DO NOT CHANGE YOUR DRESSING   Constipation Prevention    Complete by:  As directed    Drink plenty of fluids.  Prune juice may be helpful.  You may use a stool softener, such as Colace (over the counter) 100 mg twice a day.  Use MiraLax (over the counter) for constipation as needed.   Diet general    Complete by:  As directed    Discharge instructions    Complete by:  As directed    [place] items at home which could result in a fall. This includes throw rugs or furniture in walking pathways ICE to the affected joint every three hours while awake for 30 minutes at a time, for at least the first 3-5 days, and then as needed for pain and swelling.  Continue to use ice for pain and swelling. You may notice swelling  that will progress down to the foot and ankle.  This is normal after surgery.  Elevate your leg when you are not  up walking on it.   Continue to use the breathing machine you got in the hospital (incentive spirometer) which will help keep your temperature down.  It is common for your temperature to cycle up and down following surgery, especially at night when you are not up moving around and exerting yourself.  The breathing machine keeps your lungs expanded and your temperature down.   DIET:  As you were doing prior to hospitalization, we recommend a well-balanced diet.  DRESSING / WOUND CARE / SHOWERING  Keep the surgical dressing until follow up.  The dressing is water proof, so you can shower without any extra covering.  IF THE DRESSING FALLS OFF or the wound gets wet inside, change the dressing with sterile gauze.  Please use good hand washing techniques before changing the dressing.  Do not use any lotions or creams on the incision until instructed by your surgeon.    ACTIVITY  Increase activity slowly as tolerated, but follow the weight bearing instructions below.   No driving for 6 weeks or until further direction given by your physician.  You cannot drive while taking narcotics.  No lifting or carrying greater than 10 lbs. until further directed by your surgeon. Avoid periods of inactivity such as sitting longer than an hour when not asleep. This helps prevent blood clots.  You may return to work once you are authorized by your doctor.     WEIGHT BEARING   Partial weight bearing with assist device as directed.  50%   EXERCISES  Results after joint replacement surgery are often greatly improved when you follow the exercise, range of motion and muscle strengthening exercises prescribed by your doctor. Safety measures are also important to protect the joint from further injury. Any time any of these exercises cause you to have increased pain or swelling, decrease what you are doing  until you are comfortable again and then slowly increase them. If you have problems or questions, call your caregiver or physical therapist for advice.   Rehabilitation is important following a joint replacement. After just a few days of immobilization, the muscles of the leg can become weakened and shrink (atrophy).  These exercises are designed to build up the tone and strength of the thigh and leg muscles and to improve motion. Often times heat used for twenty to thirty minutes before working out will loosen up your tissues and help with improving the range of motion but do not use heat for the first two weeks following surgery (sometimes heat can increase post-operative swelling).   These exercises can be done on a training (exercise) mat, on the floor, on a table or on a bed. Use whatever works the best and is most comfortable for you.    Use music or television while you are exercising so that the exercises are a pleasant break in your day. This will make your life better with the exercises acting as a break in your routine that you can look forward to.   Perform all exercises about fifteen times, three times per day or as directed.  You should exercise both the operative leg and the other leg as well.   Exercises include:  Quad Sets - Tighten up the muscle on the front of the thigh (Quad) and hold for 5-10 seconds.   Straight Leg Raises - With your knee straight (if you were given a brace, keep it on), lift the leg to 60 degrees, hold for 3 seconds, and slowly lower the leg.  Perform this exercise against resistance later as your leg gets stronger.  Leg Slides: Lying on your back, slowly slide your foot toward your buttocks, bending your knee up off the floor (only go as far as is comfortable). Then slowly slide your foot back down until your leg is flat on the floor again.  Angel Wings: Lying on your back spread your legs to the side as far apart as you can without causing discomfort.  Hamstring  Strength:  Lying on your back, push your heel against the floor with your leg straight by tightening up the muscles of your buttocks.  Repeat, but this time bend your knee to a comfortable angle, and push your heel against the floor.  You may put a pillow under the heel to make it more comfortable if necessary.   A rehabilitation program following joint replacement surgery can speed recovery and prevent re-injury in the future due to weakened muscles. Contact your doctor or a physical therapist for more information on knee rehabilitation.    CONSTIPATION  Constipation is defined medically as fewer than three stools per week and severe constipation as less than one stool per week.  Even if you have a regular bowel pattern at home, your normal regimen is likely to be disrupted due to multiple reasons following surgery.  Combination of anesthesia, postoperative narcotics, change in appetite and fluid intake all can affect your bowels.   YOU MUST use at least one of the following options; they are listed in order of increasing strength to get the job done.  They are all available over the counter, and you may need to use some, POSSIBLY even all of these options:    Drink plenty of fluids (prune juice may be helpful) and high fiber foods Colace 100 mg by mouth twice a day  Senokot for constipation as directed and as needed Dulcolax (bisacodyl), take with full glass of water  Miralax (polyethylene glycol) once or twice a day as needed.  If you have tried all these things and are unable to have a bowel movement in the first 3-4 days after surgery call either your surgeon or your primary doctor.    If you experience loose stools or diarrhea, hold the medications until you stool forms back up.  If your symptoms do not get better within 1 week or if they get worse, check with your doctor.  If you experience "the worst abdominal pain ever" or develop nausea or vomiting, please contact the office immediately  for further recommendations for treatment.   ITCHING:  If you experience itching with your medications, try taking only a single pain pill, or even half a pain pill at a time.  You can also use Benadryl over the counter for itching or also to help with sleep.   TED HOSE STOCKINGS:  Use stockings on both legs until for at least 2 weeks or as directed by physician office. They may be removed at night for sleeping.  MEDICATIONS:  See your medication summary on the "After Visit Summary" that nursing will review with you.  You may have some home medications which will be placed on hold until you complete the course of blood thinner medication.  It is important for you to complete the blood thinner medication as prescribed.  PRECAUTIONS:  If you experience chest pain or shortness of breath - call 911 immediately for transfer to the hospital emergency department.   If you develop a fever greater that 101 F, purulent drainage  from wound, increased redness or drainage from wound, foul odor from the wound/dressing, or calf pain - CONTACT YOUR SURGEON.                                                   FOLLOW-UP APPOINTMENTS:  If you do not already have a post-op appointment, please call the office for an appointment to be seen by your surgeon.  Guidelines for how soon to be seen are listed in your "After Visit Summary", but are typically between 1-4 weeks after surgery.  OTHER INSTRUCTIONS:   Knee Replacement:  Do not place pillow under knee, focus on keeping the knee straight while resting. CPM instructions: 0-90 degrees, 2 hours in the morning, 2 hours in the afternoon, and 2 hours in the evening. Place foam block, curve side up under heel at all times except when in CPM or when walking.  DO NOT modify, tear, cut, or change the foam block in any way.  MAKE SURE YOU:  Understand these instructions.  Get help right away if you are not doing well or get worse.    Thank you for letting us be a part of  your medical care team.  It is a privilege we respect greatly.  We hope these instructions will help you stay on track for a fast and full recovery!   Do not put a pillow under the knee. Place it under the heel.    Complete by:  As directed    Driving restrictions    Complete by:  As directed    No driving for 6 weeks   Increase activity slowly as tolerated    Complete by:  As directed    Lifting restrictions    Complete by:  As directed    No lifting for 6 weeks   Partial weight bearing    Complete by:  As directed    % Body Weight:  50%   Laterality:  right   Extremity:  Lower   Patient may shower    Complete by:  As directed    You may shower over the brown dressing   TED hose    Complete by:  As directed    Use stockings (TED hose) for 2-3 weeks on right leg.  You may remove them at night for sleeping.      DISCHARGE MEDICATIONS:   Allergies as of 05/10/2017   No Known Allergies     Medication List    STOP taking these medications   aspirin EC 81 MG tablet     TAKE these medications   atorvastatin 40 MG tablet Commonly known as:  LIPITOR Take 1 tablet (40 mg total) by mouth daily. What changed:  when to take this   carvedilol 12.5 MG tablet Commonly known as:  COREG TAKE ONE TABLET BY MOUTH TWICE DAILY   lisinopril 20 MG tablet Commonly known as:  PRINIVIL,ZESTRIL Take 1 tablet (20 mg total) by mouth daily. What changed:  when to take this   metFORMIN 500 MG tablet Commonly known as:  GLUCOPHAGE Take 1 tablet (500 mg total) by mouth 2 (two) times daily.   methocarbamol 500 MG tablet Commonly known as:  ROBAXIN Take 1 tablet (500 mg total) by mouth every 8 (eight) hours as needed for muscle spasms.   oxyCODONE 5 MG immediate release tablet  Commonly known as:  Oxy IR/ROXICODONE Take 1-2 tablets (5-10 mg total) by mouth every 4 (four) hours as needed for moderate pain or severe pain.   rivaroxaban 10 MG Tabs tablet Commonly known as:  XARELTO Take 1  tablet (10 mg total) by mouth daily with breakfast.            Durable Medical Equipment        Start     Ordered   05/08/17 1806  DME Walker rolling  Once    Question:  Patient needs a walker to treat with the following condition  Answer:  S/P total knee replacement using cement, left   05/08/17 1805   05/08/17 1806  DME 3 n 1  Once     05/08/17 1805   05/08/17 1806  DME Bedside commode  Once    Question:  Patient needs a bedside commode to treat with the following condition  Answer:  Post-op pain   05/08/17 1805      FOLLOW UP VISIT:   Follow-up Information    Garald Balding, MD Follow up on 05/23/2017.   Specialty:  Orthopedic Surgery Contact information: [address] [address] [phone]           DISPOSITION:   Home  CONDITION:  Stable   Mike Craze. [place], [place] [phone]  05/10/2017 8:06 AM

## 2017-05-10 NOTE — Progress Notes (Signed)
Subjective: 2 Days Post-Op Procedure(s) (LRB): RIGHT TOTAL KNEE ARTHROPLASTY (Right) Patient reports pain as mild and moderate.    Objective: Vital signs in last 24 hours: Temp:  [98.4 F (36.9 C)-99.7 F (37.6 C)] 98.8 F (37.1 C) (08/02 0436) Pulse Rate:  [65-81] 76 (08/02 0436) Resp:  [18] 18 (08/02 0436) BP: (104-128)/(59-82) 128/67 (08/02 0436) SpO2:  [93 %-95 %] 95 % (08/02 0436)  Intake/Output from previous day: 08/01 0701 - 08/02 0700 In: 720 [P.O.:720] Out: -  Intake/Output this shift: No intake/output data recorded.   Recent Labs  05/09/17 0344 05/10/17 0331  HGB 13.5 12.2*    Recent Labs  05/09/17 0344 05/10/17 0331  WBC 18.7* 14.5*  RBC 4.71 4.21*  HCT 41.5 37.5*  PLT 267 234    Recent Labs  05/09/17 0344 05/10/17 0331  NA 136 137  K 4.1 3.6  CL 105 106  CO2 24 25  BUN 15 12  CREATININE 1.05 1.03  GLUCOSE 215* 127*  CALCIUM 8.5* 8.0*   No results for input(s): LABPT, INR in the last 72 hours.  Neurovascular intact Sensation intact distally Intact pulses distally Dorsiflexion/Plantar flexion intact Incision: dressing C/D/I and no drainage Compartment soft  Assessment/Plan: 2 Days Post-Op Procedure(s) (LRB): RIGHT TOTAL KNEE ARTHROPLASTY (Right) Advance diet Up with therapy Discharge home with home health  Biagio Borg 05/10/2017, 7:57 AM

## 2017-05-10 NOTE — Progress Notes (Signed)
Pt ready for discharge. Education/instructions reviewed with pt and family, and all questions/concerns addressed. IV removed and belongings gathered. Pt will be transported out via wheelchair to family member's vehicle.

## 2017-05-10 NOTE — Progress Notes (Signed)
Physical Therapy Treatment Patient Details Name: Darius Rogers MRN: [id number] DOB: December 21, 1955 Today's Date: 05/10/2017    History of Present Illness 61 yo male s/p R TKA with 50% PWB    PT Comments    Pt reported increased pain and stiffness this AM limiting pts ambulation distance. Overall pt is mobilizing well. Reviewed seated HEP with pt. Pt expected to d/c home this am.  Follow Up Recommendations  DC plan and follow up therapy as arranged by surgeon     Equipment Recommendations  None recommended by PT    Recommendations for Other Services       Precautions / Restrictions Precautions Precautions: Knee Precaution Booklet Issued: Yes (comment) Precaution Comments: handout given, reviewed no pillow under knee.  Restrictions Weight Bearing Restrictions: Yes RLE Weight Bearing: Partial weight bearing RLE Partial Weight Bearing Percentage or Pounds: 50%    Mobility  Bed Mobility Overal bed mobility: Needs Assistance Bed Mobility: Supine to Sit     Supine to sit: Min assist     General bed mobility comments: Min A this AM to progress LE off of bed secondary to pain and stiffness.  Transfers Overall transfer level: Needs assistance Equipment used: Rolling walker (2 wheeled) Transfers: Sit to/from Stand Sit to Stand: Supervision         General transfer comment: supervision for safety from EOB  Ambulation/Gait Ambulation/Gait assistance: Supervision Ambulation Distance (Feet): 75 Feet Assistive device: Rolling walker (2 wheeled) Gait Pattern/deviations: Step-through pattern;Decreased step length - left;Decreased stance time - right;Antalgic Gait velocity: decreased Gait velocity interpretation: Below normal speed for age/gender General Gait Details: mild antalgic gait, good adherence to weight bearing restrictions. Pt with decreased ambulation distance and gait quailty today secondary to pain.   Stairs            Wheelchair Mobility    Modified  Rankin (Stroke Patients Only)       Balance Overall balance assessment: Needs assistance Sitting-balance support: No upper extremity supported;Feet supported Sitting balance-Leahy Scale: Normal     Standing balance support: Bilateral upper extremity supported;No upper extremity supported Standing balance-Leahy Scale: Fair Standing balance comment: able to stand briefly without RW                            Cognition Arousal/Alertness: Awake/alert Behavior During Therapy: WFL for tasks assessed/performed Overall Cognitive Status: Within Functional Limits for tasks assessed                                        Exercises Total Joint Exercises Heel Slides: AROM;Right;10 reps;Seated (towel under foot) Long Arc Quad: AROM;Right;10 reps;Seated Knee Flexion: AROM;Right;5 reps;Seated (10 sec holds)    General Comments        Pertinent Vitals/Pain Pain Assessment: 0-10 Pain Score: 7  Faces Pain Scale: Hurts little more Pain Location: R knee Pain Descriptors / Indicators: Grimacing;Guarding;Sore Pain Intervention(s): Monitored during session;Limited activity within patient's tolerance;Repositioned;RN gave pain meds during session    Home Living                      Prior Function            PT Goals (current goals can now be found in the care plan section) Acute Rehab PT Goals Patient Stated Goal: to visit girlfriend  PT Goal Formulation: With patient Time For Goal  Achievement: 05/16/17 Potential to Achieve Goals: Good Progress towards PT goals: Progressing toward goals    Frequency    7X/week      PT Plan Current plan remains appropriate    Co-evaluation              AM-PAC PT "6 Clicks" Daily Activity  Outcome Measure  Difficulty turning over in bed (including adjusting bedclothes, sheets and blankets)?: None Difficulty moving from lying on back to sitting on the side of the bed? : None Difficulty sitting down  on and standing up from a chair with arms (e.g., wheelchair, bedside commode, etc,.)?: A Little Help needed moving to and from a bed to chair (including a wheelchair)?: A Little Help needed walking in hospital room?: A Little Help needed climbing 3-5 steps with a railing? : A Little 6 Click Score: 20    End of Session Equipment Utilized During Treatment: Gait belt Activity Tolerance: Patient tolerated treatment well;Patient limited by pain Patient left: in chair;with call bell/phone within reach Nurse Communication: Mobility status PT Visit Diagnosis: Muscle weakness (generalized) (M62.81);Difficulty in walking, not elsewhere classified (R26.2);Pain Pain - Right/Left: Right Pain - part of body: Knee     Time: 5056-9794 PT Time Calculation (min) (ACUTE ONLY): 26 min  Charges:  $Gait Training: 8-22 mins $Therapeutic Exercise: 8-22 mins                    G Codes:       Benjiman Core, [place] Pager [phone] Acute Rehab   Allena Katz 05/10/2017, 10:33 AM

## 2017-05-14 ENCOUNTER — Other Ambulatory Visit (INDEPENDENT_AMBULATORY_CARE_PROVIDER_SITE_OTHER): Payer: Self-pay | Admitting: Orthopaedic Surgery

## 2017-05-18 ENCOUNTER — Telehealth (INDEPENDENT_AMBULATORY_CARE_PROVIDER_SITE_OTHER): Payer: Self-pay | Admitting: Orthopaedic Surgery

## 2017-05-18 NOTE — Telephone Encounter (Signed)
Patient is completely out of Oxycodone. Please call patient to advise.

## 2017-05-18 NOTE — Telephone Encounter (Signed)
Called pt and he said his PCP was trying to get him something. appt in [facility] 05/23/17

## 2017-05-23 ENCOUNTER — Encounter (INDEPENDENT_AMBULATORY_CARE_PROVIDER_SITE_OTHER): Payer: Self-pay | Admitting: Orthopaedic Surgery

## 2017-05-23 ENCOUNTER — Ambulatory Visit (INDEPENDENT_AMBULATORY_CARE_PROVIDER_SITE_OTHER): Payer: Medicaid Other

## 2017-05-23 ENCOUNTER — Ambulatory Visit (INDEPENDENT_AMBULATORY_CARE_PROVIDER_SITE_OTHER): Payer: Medicaid Other | Admitting: Orthopaedic Surgery

## 2017-05-23 VITALS — BP 98/60 | HR 70 | Resp 14 | Ht 70.0 in | Wt 233.0 lb

## 2017-05-23 DIAGNOSIS — Z96651 Presence of right artificial knee joint: Secondary | ICD-10-CM

## 2017-05-23 NOTE — Progress Notes (Signed)
Office Visit Note   Patient: Darius Rogers           Date of Birth: 16-Sep-1956           MRN: [id number] Visit Date: 05/23/2017              Requested by: Monico Blitz, MD [address] [place], [place] [address] PCP: Monico Blitz, MD   Assessment & Plan: Visit Diagnoses:  1. Presence of right artificial knee joint     Plan: 2 weeks status post primary right total knee replacement without complications. We'll renew prescription for Robaxin 500 mg #20 and oxycodone with Tylenol 7. 5/325 #30. Office 2 weeks  Follow-Up Instructions: Return in about 2 weeks (around 06/06/2017).   Orders:  Orders Placed This Encounter  Procedures  . XR KNEE 3 VIEW RIGHT   No orders of the defined types were placed in this encounter.     Procedures: No procedures performed   Clinical Data: No additional findings.   Subjective: Chief Complaint  Patient presents with  . Right Knee - Routine Post Op    Darius Rogers is a 61 y o that is 2 weeks status post Right knee replacement. He relates he does have pain. Staples and steri strips applied.  Denies shortness of breath or chest pain, fever or chills. No significant swelling of the lower extremity.  HPI  Review of Systems  Constitutional: Negative for fatigue.  HENT: Negative for hearing loss.   Respiratory: Negative for apnea, chest tightness and shortness of breath.   Cardiovascular: Negative for chest pain, palpitations and leg swelling.  Gastrointestinal: Negative for blood in stool, constipation and diarrhea.  Genitourinary: Negative for difficulty urinating.  Musculoskeletal: Negative for arthralgias, back pain, joint swelling, myalgias, neck pain and neck stiffness.  Neurological: Negative for weakness, numbness and headaches.  Hematological: Does not bruise/bleed easily.  Psychiatric/Behavioral: Negative for sleep disturbance. The patient is not nervous/anxious.      Objective: Vital Signs: BP 98/60   Pulse 70   Resp 14   Ht 5\' 10"   (1.778 m)   Wt 233 lb (105.7 kg)   BMI 33.43 kg/m   Physical Exam  Ortho Exam right total knee incision is clean and dry. Clips are removed and Steri-Strips applied. No calf pain. Neurovascular exam intact distally.  Specialty Comments:  No specialty comments available.  Imaging: Xr Knee 3 View Right  Result Date: 05/23/2017 Films of the right knee were obtained in 3 projections standing. Total knee replacement appears to be in excellent position and alignment no evidence of complication. Patella seemed to track in the midline.. Nice glue mantle.    PMFS History: Patient Active Problem List   Diagnosis Date Noted  . Primary osteoarthritis of right knee 05/08/2017  . S/P TKR (total knee replacement) using cement, right 05/08/2017   Past Medical History:  Diagnosis Date  . Arthritis   . CAD (coronary artery disease)   . Constipation   . Diabetes mellitus without complication ([facility])    Type II  . Heart attack ([facility]) 2014  . Hyperlipemia   . Hypertension     Family History  Problem Relation Age of Onset  . Hypertension Mother   . Diabetes Mother   . Post-traumatic stress disorder Father   . Sleep apnea Father   . Other Maternal Grandmother        PPM  . Heart attack Maternal Grandfather     Past Surgical History:  Procedure Laterality Date  .  Arm surgery Right    age 50  . CARDIAC CATHETERIZATION    . COLONOSCOPY    . TOTAL KNEE ARTHROPLASTY Right 05/08/2017   Procedure: RIGHT TOTAL KNEE ARTHROPLASTY;  Surgeon: Garald Balding, MD;  Location: [facility];  Service: Orthopedics;  Laterality: Right;   Social History   Occupational History  . Not on file.   Social History Main Topics  . Smoking status: Former Smoker    Packs/day: 0.10    Years: 8.00    Types: Cigarettes    Start date: 10/09/2004    Quit date: 05/09/2013  . Smokeless tobacco: Never Used  . Alcohol use No  . Drug use: No  . Sexual activity: Not on file

## 2017-05-28 ENCOUNTER — Other Ambulatory Visit: Payer: Self-pay | Admitting: Cardiovascular Disease

## 2017-05-30 ENCOUNTER — Other Ambulatory Visit (INDEPENDENT_AMBULATORY_CARE_PROVIDER_SITE_OTHER): Payer: Self-pay | Admitting: Orthopedic Surgery

## 2017-05-30 MED ORDER — OXYCODONE-ACETAMINOPHEN 5-325 MG PO TABS: 1.0000 | ORAL_TABLET | ORAL | 0 refills | Status: DC | PRN

## 2017-06-06 ENCOUNTER — Encounter (INDEPENDENT_AMBULATORY_CARE_PROVIDER_SITE_OTHER): Payer: Self-pay | Admitting: Orthopaedic Surgery

## 2017-06-06 ENCOUNTER — Ambulatory Visit (INDEPENDENT_AMBULATORY_CARE_PROVIDER_SITE_OTHER): Payer: Medicaid Other | Admitting: Orthopaedic Surgery

## 2017-06-06 DIAGNOSIS — Z96651 Presence of right artificial knee joint: Secondary | ICD-10-CM

## 2017-06-06 MED ORDER — OXYCODONE-ACETAMINOPHEN 5-325 MG PO TABS: 1.0000 | ORAL_TABLET | Freq: Three times a day (TID) | ORAL | 0 refills | Status: DC | PRN

## 2017-06-06 NOTE — Progress Notes (Signed)
Office Visit Note   Patient: Darius Rogers           Date of Birth: May 29, 1956           MRN: [id number] Visit Date: 06/06/2017              Requested by: Monico Blitz, MD [address] [place], [place] [address] PCP: Monico Blitz, MD   Assessment & Plan: Visit Diagnoses:  1. History of total right knee replacement     Plan:One month status post right total knee replacement. Doing well. Still taking oxycodone and working on range of motion exercises and see back in 1 month  Follow-Up Instructions: Return in about 1 month (around 07/07/2017).   Orders:  No orders of the defined types were placed in this encounter.  Meds ordered this encounter  Medications  . oxyCODONE-acetaminophen (ROXICET) 5-325 MG tablet    Sig: Take 1 tablet by mouth every 8 (eight) hours as needed.    Dispense:  21 tablet    Refill:  0      Procedures: No procedures performed   Clinical Data: No additional findings.   Subjective: Chief Complaint  Patient presents with  . Right Knee - Routine Post Op    [name] is a 33 y o that is 1 month status post Right TKA. He relates he is doing very well,   Denies fever chills shortness of breath or chest pain. Not using any ambulatory aid. Still taking oxycodone but with less frequency.  HPI  Review of Systems  Constitutional: Negative for fatigue.  HENT: Negative for hearing loss.   Respiratory: Negative for apnea, chest tightness and shortness of breath.   Cardiovascular: Negative for chest pain, palpitations and leg swelling.  Gastrointestinal: Negative for blood in stool, constipation and diarrhea.  Genitourinary: Negative for difficulty urinating.  Musculoskeletal: Negative for arthralgias, back pain, joint swelling, myalgias, neck pain and neck stiffness.  Neurological: Negative for weakness, numbness and headaches.  Hematological: Does not bruise/bleed easily.  Psychiatric/Behavioral: Negative for sleep disturbance. The patient is not  nervous/anxious.      Objective: Vital Signs: There were no vitals taken for this visit.  Physical Exam  Ortho Exam full extension right knee. Flexes about 100. No instability. Incision healing without problem. No calf pain. Neurovascular exam intact.  Specialty Comments:  No specialty comments available.  Imaging: No results found.   PMFS History: Patient Active Problem List   Diagnosis Date Noted  . Primary osteoarthritis of right knee 05/08/2017  . S/P TKR (total knee replacement) using cement, right 05/08/2017   Past Medical History:  Diagnosis Date  . Arthritis   . CAD (coronary artery disease)   . Constipation   . Diabetes mellitus without complication ([facility])    Type II  . Heart attack ([facility]) 2014  . Hyperlipemia   . Hypertension     Family History  Problem Relation Age of Onset  . Hypertension Mother   . Diabetes Mother   . Post-traumatic stress disorder Father   . Sleep apnea Father   . Other Maternal Grandmother        PPM  . Heart attack Maternal Grandfather     Past Surgical History:  Procedure Laterality Date  . Arm surgery Right    age 74  . CARDIAC CATHETERIZATION    . COLONOSCOPY    . TOTAL KNEE ARTHROPLASTY Right 05/08/2017   Procedure: RIGHT TOTAL KNEE ARTHROPLASTY;  Surgeon: Garald Balding, MD;  Location: [facility];  Service: Orthopedics;  Laterality: Right;   Social History   Occupational History  . Not on file.   Social History Main Topics  . Smoking status: Former Smoker    Packs/day: 0.10    Years: 8.00    Types: Cigarettes    Start date: 10/09/2004    Quit date: 05/09/2013  . Smokeless tobacco: Never Used  . Alcohol use No  . Drug use: No  . Sexual activity: Not on file

## 2017-07-04 ENCOUNTER — Ambulatory Visit (INDEPENDENT_AMBULATORY_CARE_PROVIDER_SITE_OTHER): Payer: Medicaid Other | Admitting: Orthopaedic Surgery

## 2017-07-04 DIAGNOSIS — Z96651 Presence of right artificial knee joint: Secondary | ICD-10-CM

## 2017-07-04 NOTE — Progress Notes (Signed)
   Office Visit Note   Patient: Darius Rogers           Date of Birth: [birth year]           MRN: [id number] Visit Date: 07/04/2017              Requested by: Monico Blitz, MD [address] [place], [place] [address] PCP: Monico Blitz, MD   Assessment & Plan: Visit Diagnoses:  1. History of total right knee replacement     Plan: 2 months status post primary right total knee replacement doing very well. No related problems  Follow-Up Instructions: Return in about 6 months (around 01/01/2018).   Orders:  No orders of the defined types were placed in this encounter.  No orders of the defined types were placed in this encounter.     Procedures: No procedures performed   Clinical Data: No additional findings.   Subjective: No chief complaint on file. no history of fever or chills shortness of breath or chest pain. Minimal discomfort right knee. Working out at [employer]. No ambulatory aid.  HPI  Review of Systems   Objective: Vital Signs: There were no vitals taken for this visit.  Physical Exam  Ortho Examright knee incision healing without evidence of complication. Right knee perfectly stable without increased varus or valgus. Negative  drawer sign. Full extension. Flexes to 115 degreeswith the goniometer.no localized areas of tenderness. No swelling distally. Neurovascular exam intact.  Specialty Comments:  No specialty comments available.  Imaging: No results found.   PMFS History: Patient Active Problem List   Diagnosis Date Noted  . Primary osteoarthritis of right knee 05/08/2017  . S/P TKR (total knee replacement) using cement, right 05/08/2017   Past Medical History:  Diagnosis Date  . Arthritis   . CAD (coronary artery disease)   . Constipation   . Diabetes mellitus without complication ([facility])    Type II  . Heart attack ([facility]) 2014  . Hyperlipemia   . Hypertension     Family History  Problem Relation Age of Onset  . Hypertension Mother   . Diabetes  Mother   . Post-traumatic stress disorder Father   . Sleep apnea Father   . Other Maternal Grandmother        PPM  . Heart attack Maternal Grandfather     Past Surgical History:  Procedure Laterality Date  . Arm surgery Right    age 52  . CARDIAC CATHETERIZATION    . COLONOSCOPY    . TOTAL KNEE ARTHROPLASTY Right 05/08/2017   Procedure: RIGHT TOTAL KNEE ARTHROPLASTY;  Surgeon: Garald Balding, MD;  Location: [facility];  Service: Orthopedics;  Laterality: Right;   Social History   Occupational History  . Not on file.   Social History Main Topics  . Smoking status: Former Smoker    Packs/day: 0.10    Years: 8.00    Types: Cigarettes    Start date: 10/09/2004    Quit date: 05/09/2013  . Smokeless tobacco: Never Used  . Alcohol use No  . Drug use: No  . Sexual activity: Not on file     Garald Balding, MD   Note - This record has been created using [employer].  Chart creation errors have been sought, but may not always  have been located. Such creation errors do not reflect on  the standard of medical care.

## 2017-08-09 ENCOUNTER — Other Ambulatory Visit: Payer: Self-pay | Admitting: Cardiovascular Disease

## 2017-09-25 ENCOUNTER — Other Ambulatory Visit: Payer: Self-pay | Admitting: Cardiovascular Disease

## 2017-09-26 ENCOUNTER — Encounter (INDEPENDENT_AMBULATORY_CARE_PROVIDER_SITE_OTHER): Payer: Self-pay | Admitting: Orthopaedic Surgery

## 2017-09-26 ENCOUNTER — Ambulatory Visit (INDEPENDENT_AMBULATORY_CARE_PROVIDER_SITE_OTHER): Payer: Medicaid Other | Admitting: Orthopaedic Surgery

## 2017-09-26 VITALS — BP 110/75 | HR 81 | Resp 12 | Ht 72.0 in | Wt 230.0 lb

## 2017-09-26 DIAGNOSIS — M170 Bilateral primary osteoarthritis of knee: Secondary | ICD-10-CM | POA: Insufficient documentation

## 2017-09-26 NOTE — Progress Notes (Signed)
Office Visit Note   Patient: Darius Rogers           Date of Birth: 10/13/1955           MRN: [id number] Visit Date: 09/26/2017              Requested by: Monico Blitz, MD [address] [place], [place] [address] PCP: Monico Blitz, MD   Assessment & Plan: Visit Diagnoses:  1. Bilateral primary osteoarthritis of knee     Plan: He is seen by his cardiologist once a year and is not due until June. He's not had any cardiac issues.  Follow-Up Instructions: Return will schedule left total knee.   Orders:  No orders of the defined types were placed in this encounter.  No orders of the defined types were placed in this encounter.     Procedures: No procedures performed   Clinical Data: No additional findings.   Subjective: Chief Complaint  Patient presents with  . Left Knee - Pain    Darius Rogers is a 60 y o here today to discuss Left TKA surgery, in February.   Status post right total knee replacement end of July and doing very well. No issues with his right knee. Wishes to proceed with a left total knee replacement sometime in February as it does compromise his activities .prior films demonstrate end-stage osteoarthritis in all 3 .compartments of his left knee similar to those that were demonstrated on the right  Review of Systems  Constitutional: Negative for fatigue.  HENT: Negative for hearing loss.   Respiratory: Negative for apnea, chest tightness and shortness of breath.   Cardiovascular: Negative for chest pain, palpitations and leg swelling.  Gastrointestinal: Negative for blood in stool, constipation and diarrhea.  Genitourinary: Negative for difficulty urinating.  Musculoskeletal: Negative for arthralgias, back pain, joint swelling, myalgias, neck pain and neck stiffness.  Neurological: Negative for weakness, numbness and headaches.  Hematological: Does not bruise/bleed easily.  Psychiatric/Behavioral: Negative for sleep disturbance. The patient is not nervous/anxious.       Objective: Vital Signs: BP 110/75   Pulse 81   Resp 12   Ht 6' (1.829 m)   Wt 230 lb (104.3 kg)   BMI 31.19 kg/m   Physical Exam  Ortho Exam awake alert and oriented 3. Comfortable sitting. Right knee exam with quick full extension 110 of flexion. No instability. No joint pain effusion redness or ecchymosis. No distal edema. No limp.  Left knee with slight loss of full extension of about 3-4. Flexes over 105 without instability. No effusion. Some patellar crepitation. Neurovascular exam intact distally  Specialty Comments:  No specialty comments available.  Imaging: No results found.   PMFS History: Patient Active Problem List   Diagnosis Date Noted  . Bilateral primary osteoarthritis of knee 09/26/2017  . Primary osteoarthritis of right knee 05/08/2017  . S/P TKR (total knee replacement) using cement, right 05/08/2017   Past Medical History:  Diagnosis Date  . Arthritis   . CAD (coronary artery disease)   . Constipation   . Diabetes mellitus without complication ([facility])    Type II  . Heart attack ([facility]) 2014  . Hyperlipemia   . Hypertension     Family History  Problem Relation Age of Onset  . Hypertension Mother   . Diabetes Mother   . Post-traumatic stress disorder Father   . Sleep apnea Father   . Other Maternal Grandmother        PPM  . Heart attack  Maternal Grandfather     Past Surgical History:  Procedure Laterality Date  . Arm surgery Right    age 3  . CARDIAC CATHETERIZATION    . COLONOSCOPY    . TOTAL KNEE ARTHROPLASTY Right 05/08/2017   Procedure: RIGHT TOTAL KNEE ARTHROPLASTY;  Surgeon: Garald Balding, MD;  Location: [facility];  Service: Orthopedics;  Laterality: Right;   Social History   Occupational History  . Not on file  Tobacco Use  . Smoking status: Former Smoker    Packs/day: 0.10    Years: 8.00    Pack years: 0.80    Types: Cigarettes    Start date: 10/09/2004    Last attempt to quit: 05/09/2013    Years since quitting:  4.3  . Smokeless tobacco: Never Used  Substance and Sexual Activity  . Alcohol use: No    Alcohol/week: 0.0 oz  . Drug use: No  . Sexual activity: Not on file

## 2017-10-31 ENCOUNTER — Ambulatory Visit (INDEPENDENT_AMBULATORY_CARE_PROVIDER_SITE_OTHER): Payer: Medicaid Other | Admitting: Orthopedic Surgery

## 2017-10-31 ENCOUNTER — Encounter (INDEPENDENT_AMBULATORY_CARE_PROVIDER_SITE_OTHER): Payer: Self-pay | Admitting: Orthopedic Surgery

## 2017-10-31 VITALS — BP 116/71 | HR 73 | Temp 97.0°F | Ht 72.0 in | Wt 232.0 lb

## 2017-10-31 DIAGNOSIS — M1712 Unilateral primary osteoarthritis, left knee: Secondary | ICD-10-CM | POA: Diagnosis not present

## 2017-10-31 NOTE — H&P (Signed)
TOTAL KNEE ADMISSION H&P  Patient is being admitted for left total knee arthroplasty.  Subjective:  Chief Complaint:left knee pain.  HPI: Darius Rogers, 62 y.o. male, has a history of pain and functional disability in the left knee due to arthritis and has failed non-surgical conservative treatments for greater than 12 weeks to includeNSAID's and/or analgesics, corticosteriod injections, viscosupplementation injections, flexibility and strengthening excercises, weight reduction as appropriate and activity modification.  Onset of symptoms was gradual, starting 4 years ago with gradually worsening course since that time. The patient noted no past surgery on the left knee(s).  Patient currently rates pain in the left knee(s) at 6 out of 10 with activity. Patient has worsening of pain with activity and weight bearing.  Patient has evidence of subchondral cysts, subchondral sclerosis, periarticular osteophytes, joint subluxation and joint space narrowing by imaging studies.There is no active infection.  Patient Active Problem List   Diagnosis Date Noted  . Bilateral primary osteoarthritis of knee 09/26/2017  . Primary osteoarthritis of right knee 05/08/2017  . S/P TKR (total knee replacement) using cement, right 05/08/2017   Past Medical History:  Diagnosis Date  . Arthritis   . CAD (coronary artery disease)   . Constipation   . Diabetes mellitus without complication ([facility])    Type II  . Heart attack ([facility]) 2014  . Hyperlipemia   . Hypertension     Past Surgical History:  Procedure Laterality Date  . Arm surgery Right    age 71  . CARDIAC CATHETERIZATION    . COLONOSCOPY    . TOTAL KNEE ARTHROPLASTY Right 05/08/2017   Procedure: RIGHT TOTAL KNEE ARTHROPLASTY;  Surgeon: Garald Balding, MD;  Location: [facility];  Service: Orthopedics;  Laterality: Right;    No current facility-administered medications for this encounter.    Current Outpatient Medications  Medication Sig Dispense Refill  Last Dose  . aspirin 81 MG chewable tablet Chew 81 mg by mouth daily.   Taking  . atorvastatin (LIPITOR) 40 MG tablet TAKE 1 TABLET BY MOUTH ONCE DAILY 90 tablet 3 Taking  . carvedilol (COREG) 12.5 MG tablet TAKE ONE TABLET BY MOUTH TWICE DAILY 60 tablet 6 Taking  . lisinopril (PRINIVIL,ZESTRIL) 20 MG tablet TAKE ONE TABLET BY MOUTH ONCE DAILY 90 tablet 1 Taking  . metFORMIN (GLUCOPHAGE) 500 MG tablet Take 1 tablet (500 mg total) by mouth 2 (two) times daily. 60 tablet 0 Taking  . methocarbamol (ROBAXIN) 500 MG tablet Take 1 tablet (500 mg total) by mouth every 8 (eight) hours as needed for muscle spasms. (Patient not taking: Reported on 09/26/2017) 30 tablet 0 Not Taking  . oxyCODONE (OXY IR/ROXICODONE) 5 MG immediate release tablet Take 1-2 tablets (5-10 mg total) by mouth every 4 (four) hours as needed for moderate pain or severe pain. (Patient not taking: Reported on 09/26/2017) 80 tablet 0 Not Taking  . oxyCODONE-acetaminophen (ROXICET) 5-325 MG tablet Take 1 tablet by mouth every 8 (eight) hours as needed. (Patient not taking: Reported on 09/26/2017) 21 tablet 0 Not Taking  . rivaroxaban (XARELTO) 10 MG TABS tablet Take 1 tablet (10 mg total) by mouth daily with breakfast. (Patient not taking: Reported on 05/23/2017) 12 tablet 0 Not Taking  . tamsulosin (FLOMAX) 0.4 MG CAPS capsule Take 0.4 mg by mouth.   Taking   No Known Allergies  Social History   Tobacco Use  . Smoking status: Former Smoker    Packs/day: 0.10    Years: 8.00    Pack years: 0.80  Types: Cigarettes    Start date: 10/09/2004    Last attempt to quit: 05/09/2013    Years since quitting: 4.4  . Smokeless tobacco: Never Used  Substance Use Topics  . Alcohol use: No    Alcohol/week: 0.0 oz    Family History  Problem Relation Age of Onset  . Hypertension Mother   . Diabetes Mother   . Post-traumatic stress disorder Father   . Sleep apnea Father   . Other Maternal Grandmother        PPM  . Heart attack Maternal  Grandfather      ROS  Review of Systems  Constitutional: Negative.   HENT: Negative.   Eyes: Negative.   Respiratory: Negative.   Cardiovascular: Negative.   Gastrointestinal: Positive for constipation.  Genitourinary: Negative.   Musculoskeletal: Negative.   Skin: Negative.   Neurological: Negative.   Psychiatric/Behavioral: Negative.     Objective:  Physical Exam  Constitutional: He is oriented to person, place, and time. He appears well-developed and well-nourished.  HENT:  Head: Normocephalic and atraumatic.  Eyes: Conjunctivae and EOM are normal. Pupils are equal, round, and reactive to light.  Neck: Neck supple.  No carotid bruits  Cardiovascular: Normal rate, regular rhythm, normal heart sounds and intact distal pulses.  No murmur heard. Respiratory: Effort normal and breath sounds normal. He has no wheezes.  GI: Soft. Bowel sounds are normal. There is no tenderness.  Neurological: He is alert and oriented to person, place, and time.  Skin: Skin is warm and dry.  Psychiatric: He has a normal mood and affect. His behavior is normal. Judgment and thought content normal.    Vital signs in last 24 hours: Temp:  [97 F (36.1 C)] 97 F (36.1 C) (01/23 1322) Pulse Rate:  [73] 73 (01/23 1322) BP: (116)/(71) 116/71 (01/23 1322) Weight:  [232 lb (105.2 kg)] 232 lb (105.2 kg) (01/23 1322)  Labs:   Estimated body mass index is 31.46 kg/m as calculated from the following:   Height as of 10/31/17: 6' (1.829 m).   Weight as of 10/31/17: 232 lb (105.2 kg).   Imaging Review Plain radiographs demonstrate severe degenerative joint disease of the left knee(s). The overall alignment issignificant varus. The bone quality appears to be good for age and reported activity level.  Assessment/Plan:  End stage arthritis, left knee   The patient history, physical examination, clinical judgment of the provider and imaging studies are consistent with end stage degenerative joint  disease of the left knee(s) and total knee arthroplasty is deemed medically necessary. The treatment options including medical management, injection therapy arthroscopy and arthroplasty were discussed at length. The risks and benefits of total knee arthroplasty were presented and reviewed. The risks due to aseptic loosening, infection, stiffness, patella tracking problems, thromboembolic complications and other imponderables were discussed. The patient acknowledged the explanation, agreed to proceed with the plan and consent was signed. Patient is being admitted for inpatient treatment for surgery, pain control, PT, OT, prophylactic antibiotics, VTE prophylaxis, progressive ambulation and ADL's and discharge planning. The patient is planning to be discharged home with home health services   Mike Craze. [place], [place] [phone]  10/31/2017 1:53 PM

## 2017-10-31 NOTE — Progress Notes (Deleted)
   Office Visit Note   Patient: Darius Rogers           Date of Birth: 1956/04/19           MRN: [id number] Visit Date: 10/31/2017              Requested by: Monico Blitz, MD [place], [place] [address] PCP: Monico Blitz, MD   Assessment & Plan: Visit Diagnoses: No diagnosis found.  Plan: ***  Follow-Up Instructions: No Follow-up on file.   Orders:  No orders of the defined types were placed in this encounter.  No orders of the defined types were placed in this encounter.     Procedures: No procedures performed   Clinical Data: No additional findings.   Subjective: No chief complaint on file.   HPI  Review of Systems  Constitutional: Negative.   HENT: Negative.   Eyes: Negative.   Respiratory: Negative.   Cardiovascular: Negative.   Gastrointestinal: Positive for constipation.  Genitourinary: Negative.   Musculoskeletal: Negative.   Skin: Negative.   Neurological: Negative.   Psychiatric/Behavioral: Negative.      Objective: Vital Signs: There were no vitals taken for this visit.  Physical Exam  Ortho Exam  Specialty Comments:  No specialty comments available.  Imaging: No results found.   PMFS History: Patient Active Problem List   Diagnosis Date Noted  . Bilateral primary osteoarthritis of knee 09/26/2017  . Primary osteoarthritis of right knee 05/08/2017  . S/P TKR (total knee replacement) using cement, right 05/08/2017   Past Medical History:  Diagnosis Date  . Arthritis   . CAD (coronary artery disease)   . Constipation   . Diabetes mellitus without complication ([facility])    Type II  . Heart attack ([facility]) 2014  . Hyperlipemia   . Hypertension     Family History  Problem Relation Age of Onset  . Hypertension Mother   . Diabetes Mother   . Post-traumatic stress disorder Father   . Sleep apnea Father   . Other Maternal Grandmother        PPM  . Heart attack Maternal Grandfather     Past Surgical History:  Procedure  Laterality Date  . Arm surgery Right    age 61  . CARDIAC CATHETERIZATION    . COLONOSCOPY    . TOTAL KNEE ARTHROPLASTY Right 05/08/2017   Procedure: RIGHT TOTAL KNEE ARTHROPLASTY;  Surgeon: Garald Balding, MD;  Location: [facility];  Service: Orthopedics;  Laterality: Right;   Social History   Occupational History  . Not on file  Tobacco Use  . Smoking status: Former Smoker    Packs/day: 0.10    Years: 8.00    Pack years: 0.80    Types: Cigarettes    Start date: 10/09/2004    Last attempt to quit: 05/09/2013    Years since quitting: 4.4  . Smokeless tobacco: Never Used  Substance and Sexual Activity  . Alcohol use: No    Alcohol/week: 0.0 oz  . Drug use: No  . Sexual activity: Not on file

## 2017-10-31 NOTE — Progress Notes (Signed)
Chief Complaint:left knee pain.  HPI: Darius Rogers, 62 y.o. male, has a history of pain and functional disability in the left knee due to arthritis and has failed non-surgical conservative treatments for greater than 12 weeks to includeNSAID's and/or analgesics, corticosteriod injections, viscosupplementation injections, flexibility and strengthening excercises, weight reduction as appropriate and activity modification.  Onset of symptoms was gradual, starting 4 years ago with gradually worsening course since that time. The patient noted no past surgery on the left knee(s).  Patient currently rates pain in the left knee(s) at 6 out of 10 with activity. Patient has worsening of pain with activity and weight bearing.  Patient has evidence of subchondral cysts, subchondral sclerosis, periarticular osteophytes, joint subluxation and joint space narrowing by imaging studies.There is no active infection.  Patient Active Problem List   Diagnosis Date Noted  . Bilateral primary osteoarthritis of knee 09/26/2017  . Primary osteoarthritis of right knee 05/08/2017  . S/P TKR (total knee replacement) using cement, right 05/08/2017   Past Medical History:  Diagnosis Date  . Arthritis   . CAD (coronary artery disease)   . Constipation   . Diabetes mellitus without complication ([place])    Type II  . Heart attack ([place]) 2014  . Hyperlipemia   . Hypertension     Past Surgical History:  Procedure Laterality Date  . Arm surgery Right    age 35  . CARDIAC CATHETERIZATION    . COLONOSCOPY    . TOTAL KNEE ARTHROPLASTY Right 05/08/2017   Procedure: RIGHT TOTAL KNEE ARTHROPLASTY;  Surgeon: Garald Balding, MD;  Location: [facility];  Service: Orthopedics;  Laterality: Right;    No current facility-administered medications for this encounter.    Current Outpatient Medications  Medication Sig Dispense Refill Last Dose  . aspirin 81 MG chewable tablet Chew 81 mg by mouth daily.   Taking  . atorvastatin (LIPITOR)  40 MG tablet TAKE 1 TABLET BY MOUTH ONCE DAILY 90 tablet 3 Taking  . carvedilol (COREG) 12.5 MG tablet TAKE ONE TABLET BY MOUTH TWICE DAILY 60 tablet 6 Taking  . lisinopril (PRINIVIL,ZESTRIL) 20 MG tablet TAKE ONE TABLET BY MOUTH ONCE DAILY 90 tablet 1 Taking  . metFORMIN (GLUCOPHAGE) 500 MG tablet Take 1 tablet (500 mg total) by mouth 2 (two) times daily. 60 tablet 0 Taking  . methocarbamol (ROBAXIN) 500 MG tablet Take 1 tablet (500 mg total) by mouth every 8 (eight) hours as needed for muscle spasms. (Patient not taking: Reported on 09/26/2017) 30 tablet 0 Not Taking  . oxyCODONE (OXY IR/ROXICODONE) 5 MG immediate release tablet Take 1-2 tablets (5-10 mg total) by mouth every 4 (four) hours as needed for moderate pain or severe pain. (Patient not taking: Reported on 09/26/2017) 80 tablet 0 Not Taking  . oxyCODONE-acetaminophen (ROXICET) 5-325 MG tablet Take 1 tablet by mouth every 8 (eight) hours as needed. (Patient not taking: Reported on 09/26/2017) 21 tablet 0 Not Taking  . rivaroxaban (XARELTO) 10 MG TABS tablet Take 1 tablet (10 mg total) by mouth daily with breakfast. (Patient not taking: Reported on 05/23/2017) 12 tablet 0 Not Taking  . tamsulosin (FLOMAX) 0.4 MG CAPS capsule Take 0.4 mg by mouth.   Taking   No Known Allergies  Social History   Tobacco Use  . Smoking status: Former Smoker    Packs/day: 0.10    Years: 8.00    Pack years: 0.80    Types: Cigarettes    Start date: 10/09/2004    Last attempt to quit:  05/09/2013    Years since quitting: 4.4  . Smokeless tobacco: Never Used  Substance Use Topics  . Alcohol use: No    Alcohol/week: 0.0 oz    Family History  Problem Relation Age of Onset  . Hypertension Mother   . Diabetes Mother   . Post-traumatic stress disorder Father   . Sleep apnea Father   . Other Maternal Grandmother        PPM  . Heart attack Maternal Grandfather      ROS  Review of Systems  Constitutional: Negative.   HENT: Negative.   Eyes: Negative.     Respiratory: Negative.   Cardiovascular: Negative.   Gastrointestinal: Positive for constipation.  Genitourinary: Negative.   Musculoskeletal: Negative.   Skin: Negative.   Neurological: Negative.   Psychiatric/Behavioral: Negative.     Objective:  Physical Exam  Constitutional: He is oriented to person, place, and time. He appears well-developed and well-nourished.  HENT:  Head: Normocephalic and atraumatic.  Eyes: Conjunctivae and EOM are normal. Pupils are equal, round, and reactive to light.  Neck: Neck supple.  No carotid bruits  Cardiovascular: Normal rate, regular rhythm, normal heart sounds and intact distal pulses.  No murmur heard. Respiratory: Effort normal and breath sounds normal. He has no wheezes.  GI: Soft. Bowel sounds are normal. There is no tenderness.  Neurological: He is alert and oriented to person, place, and time.  Skin: Skin is warm and dry.  Psychiatric: He has a normal mood and affect. His behavior is normal. Judgment and thought content normal.    Vital signs in last 24 hours: Temp:  [97 F (36.1 C)] 97 F (36.1 C) (01/23 1322) Pulse Rate:  [73] 73 (01/23 1322) BP: (116)/(71) 116/71 (01/23 1322) Weight:  [232 lb (105.2 kg)] 232 lb (105.2 kg) (01/23 1322)  Labs:   Estimated body mass index is 31.46 kg/m as calculated from the following:   Height as of 10/31/17: 6' (1.829 m).   Weight as of 10/31/17: 232 lb (105.2 kg).   Imaging Review Plain radiographs demonstrate severe degenerative joint disease of the left knee(s). The overall alignment issignificant varus. The bone quality appears to be good for age and reported activity level.  Assessment/Plan:  End stage arthritis, left knee   The patient history, physical examination, clinical judgment of the provider and imaging studies are consistent with end stage degenerative joint disease of the left knee(s) and total knee arthroplasty is deemed medically necessary. The treatment options  including medical management, injection therapy arthroscopy and arthroplasty were discussed at length. The risks and benefits of total knee arthroplasty were presented and reviewed. The risks due to aseptic loosening, infection, stiffness, patella tracking problems, thromboembolic complications and other imponderables were discussed. The patient acknowledged the explanation, agreed to proceed with the plan and consent was signed. Patient is being admitted for inpatient treatment for surgery, pain control, PT, OT, prophylactic antibiotics, VTE prophylaxis, progressive ambulation and ADL's and discharge planning. The patient is planning to be discharged home with home health services   Face-to-face time spent with patient was greater than 40 minutes.  Greater than 50% of the time was spent in counseling and coordination of care.  Mike Craze [place], [place] [phone]  10/31/2017 1:53 PM

## 2017-11-01 ENCOUNTER — Encounter (HOSPITAL_COMMUNITY): Payer: Self-pay

## 2017-11-01 ENCOUNTER — Other Ambulatory Visit: Payer: Self-pay

## 2017-11-01 ENCOUNTER — Encounter (HOSPITAL_COMMUNITY)
Admission: RE | Admit: 2017-11-01 | Discharge: 2017-11-01 | Disposition: A | Discharge status: Home / Self Care | Payer: Medicaid Other | Source: Ambulatory Visit | Attending: Orthopaedic Surgery | Admitting: Orthopaedic Surgery

## 2017-11-01 DIAGNOSIS — Z01812 Encounter for preprocedural laboratory examination: Secondary | ICD-10-CM | POA: Insufficient documentation

## 2017-11-01 LAB — COMPREHENSIVE METABOLIC PANEL
ALT: 32 U/L (ref 17–63)
AST: 33 U/L (ref 15–41)
Albumin: 3.8 g/dL (ref 3.5–5.0)
Alkaline Phosphatase: 77 U/L (ref 38–126)
Anion gap: 11 (ref 5–15)
BILIRUBIN TOTAL: 0.5 mg/dL (ref 0.3–1.2)
BUN: 8 mg/dL (ref 6–20)
CALCIUM: 9.4 mg/dL (ref 8.9–10.3)
CO2: 22 mmol/L (ref 22–32)
CREATININE: 1.18 mg/dL (ref 0.61–1.24)
Chloride: 102 mmol/L (ref 101–111)
Glucose, Bld: 105 mg/dL — ABNORMAL HIGH (ref 65–99)
Potassium: 4.1 mmol/L (ref 3.5–5.1)
Sodium: 135 mmol/L (ref 135–145)
TOTAL PROTEIN: 7.1 g/dL (ref 6.5–8.1)

## 2017-11-01 LAB — CBC WITH DIFFERENTIAL/PLATELET
BASOS ABS: 0 10*3/uL (ref 0.0–0.1)
Basophils Relative: 0 %
EOS PCT: 7 %
Eosinophils Absolute: 0.6 10*3/uL (ref 0.0–0.7)
HEMATOCRIT: 49.4 % (ref 39.0–52.0)
Hemoglobin: 16.4 g/dL (ref 13.0–17.0)
LYMPHS ABS: 2.9 10*3/uL (ref 0.7–4.0)
LYMPHS PCT: 35 %
MCH: 29.8 pg (ref 26.0–34.0)
MCHC: 33.2 g/dL (ref 30.0–36.0)
MCV: 89.8 fL (ref 78.0–100.0)
Monocytes Absolute: 0.5 10*3/uL (ref 0.1–1.0)
Monocytes Relative: 7 %
NEUTROS ABS: 4.1 10*3/uL (ref 1.7–7.7)
Neutrophils Relative %: 51 %
PLATELETS: 257 10*3/uL (ref 150–400)
RBC: 5.5 MIL/uL (ref 4.22–5.81)
RDW: 14.8 % (ref 11.5–15.5)
WBC: 8.1 10*3/uL (ref 4.0–10.5)

## 2017-11-01 LAB — URINALYSIS, ROUTINE W REFLEX MICROSCOPIC
Bilirubin Urine: NEGATIVE
Glucose, UA: NEGATIVE mg/dL
Hgb urine dipstick: NEGATIVE
KETONES UR: NEGATIVE mg/dL
LEUKOCYTES UA: NEGATIVE
NITRITE: NEGATIVE
PH: 5 (ref 5.0–8.0)
Protein, ur: NEGATIVE mg/dL
SPECIFIC GRAVITY, URINE: 1.008 (ref 1.005–1.030)

## 2017-11-01 LAB — APTT: APTT: 31 s (ref 24–36)

## 2017-11-01 LAB — HEMOGLOBIN A1C
Hgb A1c MFr Bld: 6.9 % — ABNORMAL HIGH (ref 4.8–5.6)
MEAN PLASMA GLUCOSE: 151.33 mg/dL

## 2017-11-01 LAB — TYPE AND SCREEN
ABO/RH(D): O POS
Antibody Screen: NEGATIVE

## 2017-11-01 LAB — SURGICAL PCR SCREEN
MRSA, PCR: NEGATIVE
STAPHYLOCOCCUS AUREUS: POSITIVE — AB

## 2017-11-01 LAB — PROTIME-INR
INR: 0.92
PROTHROMBIN TIME: 12.3 s (ref 11.4–15.2)

## 2017-11-01 NOTE — Pre-Procedure Instructions (Signed)
Iam Lipson  11/01/2017      [facility] [address] [place] [address] Phone: [phone] Fax: [phone]    Your procedure is scheduled on Tuesday, February 5.  Report to [facility] Admitting at 8:15 A.M.               Your surgery or procedure is scheduled for 10:15 AM   Call this number if you have problems the morning of surgery: [phone]     For any other questions, please call [phone], Monday - Friday 8 AM - 4 PM.   Remember:  Do not eat food or drink liquids after midnight Monday, January 4.   Take these medicines the morning of surgery with A SIP OF WATER:  carvedilol (COREG)          DO NOT take medication for Diabetes the morning of surgery.  1 Week prior to surgery STOP taking Aspirin, Aspirin Products (Goody Powder, Excedrin Migraine), Ibuprofen (Advil), Naproxen (Aleve), Vitamins and Herbal Products (ie Fish Oil).   How to Manage Your Diabetes Before and After Surgery  Why is it important to control my blood sugar before and after surgery? . Improving blood sugar levels before and after surgery helps healing and can limit problems. . A way of improving blood sugar control is eating a healthy diet by: o  Eating less sugar and carbohydrates o  Increasing activity/exercise o  Talking with your doctor about reaching your blood sugar goals . High blood sugars (greater than 180 mg/dL) can raise your risk of infections and slow your recovery, so you will need to focus on controlling your diabetes during the weeks before surgery. . Make sure that the doctor who takes care of your diabetes knows about your planned surgery including the date and location.  How do I manage my blood sugar before surgery? . Check your blood sugar at least 4 times a day, starting 2 days before surgery, to make sure that the level is not too high or low. o Check your blood sugar the morning of your surgery when you wake up  and every 2 hours until you get to the Short Stay unit. . If your blood sugar is less than 70 mg/dL, you will need to treat for low blood sugar: o Do not take insulin. o Treat a low blood sugar (less than 70 mg/dL) with  cup of clear juice (cranberry or apple), 4 glucose tablets, OR glucose gel. Recheck blood sugar in 15 minutes after treatment (to make sure it is greater than 70 mg/dL). If your blood sugar is not greater than 70 mg/dL on recheck, call [phone] o  for further instructions. . Report your blood sugar to the short stay nurse when you get to Short Stay.  . If you are admitted to the hospital after surgery: o Your blood sugar will be checked by the staff and you will probably be given insulin after surgery (instead of oral diabetes medicines) to make sure you have good blood sugar levels. o The goal for blood sugar control after surgery is 80-180 mg/dL.  WHAT DO I DO ABOUT MY DIABETES MEDICATION?   Do not take oral diabetes medicines (pills) the morning of surgery.  Special instructions:   [place]- Preparing For Surgery  Before surgery, you can play an important role. Because skin is not sterile, your skin needs to be as free of germs as possible.  You can reduce the number of germs on your skin by washing with CHG (chlorahexidine gluconate) Soap before surgery.  CHG is an antiseptic cleaner which kills germs and bonds with the skin to continue killing germs even after washing.  Please do not use if you have an allergy to CHG or antibacterial soaps. If your skin becomes reddened/irritated stop using the CHG.  Do not shave (including legs and underarms) for at least 48 hours prior to first CHG shower. It is OK to shave your face.  Please follow these instructions carefully.   1. Shower the NIGHT BEFORE SURGERY and the MORNING OF SURGERY with CHG.   2. If you chose to wash your hair, wash your hair first as usual with your normal shampoo.  3. After you shampoo, rinse  your hair and body thoroughly to remove the shampoo.     Wash your face and private area with the soap you use at home, then rinse.  4. Use CHG as you would any other liquid soap. You can apply CHG directly to the skin and wash gently with a scrungie or a clean washcloth.   5. Apply the CHG Soap to your body ONLY FROM THE NECK DOWN.  Do not use on open wounds or open sores. Avoid contact with your eyes, ears, mouth and genitals (private parts). Wash Face and genitals (private parts)  with your normal soap.  6. Wash thoroughly, paying special attention to the area where your surgery will be performed.  7. Thoroughly rinse your body with warm water from the neck down.  8. DO NOT shower/wash with your normal soap after using and rinsing off the CHG Soap.  9. Pat yourself dry with a CLEAN TOWEL.  10. Wear CLEAN PAJAMAS to bed the night before surgery, wear comfortable clothes the morning of surgery  11. Place CLEAN SHEETS on your bed the night of your first shower and DO NOT SLEEP WITH PETS.   Day of Surgery:  Shower as Above Do not apply any deodorants/lotions, powders or colognes. Please wear clean clothes to the hospital/surgery center.    Do not wear jewelry, make-up or nail polish.  Do not shave 48 hours prior to surgery.  Men may shave face and neck.  Do not bring valuables to the hospital.  [facility] is not responsible for any belongings or valuables.  Contacts, dentures or bridgework may not be worn into surgery.  Leave your suitcase in the car.  After surgery it may be brought to your room.  For patients admitted to the hospital, discharge time will be determined by your treatment team.  Patients discharged the day of surgery will not be allowed to drive home.   Please read over the following fact sheets that you were given.  Pain Booklet, Patient Instructions for Mupirocin Application, Incentive Spirometry, Surgical Site Infections.    Patient Signature:  Date:   Nurse  Signature:  Date:

## 2017-11-01 NOTE — Progress Notes (Addendum)
Pt. Denies all chest concerns. Pt. Followed by Dr. Manuella Ghazi in [place] for PCP & also has seen Dr. Jacinta Shoe with [facility]. Pt. Has had a stent placed in [facility], several yrs. Ago. Pt. Has had clearance by Dr. Jacinta Shoe < 1 yr. Ago for the R knee replacement. Pt. Denies any changes since then. ADDENDUM: Pt. Verbalizes the understanding of continuing the use of the aspirin until the day before surgery, then to hold the day of surgery.

## 2017-11-01 NOTE — Pre-Procedure Instructions (Signed)
Revere Maahs  11/01/2017      [facility] [address] [place] [address] Phone: [phone] Fax: [phone]    Your procedure is scheduled on Tuesday, February 5.  Report to [facility] Admitting at 8:15 A.M.               Your surgery or procedure is scheduled for 10:15 AM   Call this number if you have problems the morning of surgery: [phone]     For any other questions, please call [phone], Monday - Friday 8 AM - 4 PM.   Remember:  Do not eat food or drink liquids after midnight Monday, January 4.   Take these medicines the morning of surgery with A SIP OF WATER:  carvedilol (COREG)           DO NOT take medication for Diabetes the morning of surgery.  1 Week prior to surgery STOP taking , Ibuprofen (Advil), Naproxen (Aleve), Vitamins and Herbal Products (ie Fish Oil).  Continue ASPIRIN  up until 2/4, do not take it Day  ( morning ) of surgery    How to Manage Your Diabetes Before and After Surgery  Why is it important to control my blood sugar before and after surgery? . Improving blood sugar levels before and after surgery helps healing and can limit problems. . A way of improving blood sugar control is eating a healthy diet by: o  Eating less sugar and carbohydrates o  Increasing activity/exercise o  Talking with your doctor about reaching your blood sugar goals . High blood sugars (greater than 180 mg/dL) can raise your risk of infections and slow your recovery, so you will need to focus on controlling your diabetes during the weeks before surgery. . Make sure that the doctor who takes care of your diabetes knows about your planned surgery including the date and location.  How do I manage my blood sugar before surgery? . Check your blood sugar at least 4 times a day, starting 2 days before surgery, to make sure that the level is not too high or low. o Check your blood sugar the morning of your  surgery when you wake up and every 2 hours until you get to the Short Stay unit. . If your blood sugar is less than 70 mg/dL, you will need to treat for low blood sugar: o Do not take insulin. o Treat a low blood sugar (less than 70 mg/dL) with  cup of clear juice (cranberry or apple), 4 glucose tablets, OR glucose gel. Recheck blood sugar in 15 minutes after treatment (to make sure it is greater than 70 mg/dL). If your blood sugar is not greater than 70 mg/dL on recheck, call [phone] o  for further instructions. . Report your blood sugar to the short stay nurse when you get to Short Stay.  . If you are admitted to the hospital after surgery: o Your blood sugar will be checked by the staff and you will probably be given insulin after surgery (instead of oral diabetes medicines) to make sure you have good blood sugar levels. o The goal for blood sugar control after surgery is 80-180 mg/dL.  WHAT DO I DO ABOUT MY DIABETES MEDICATION?   Do not take oral diabetes medicines (pills) the morning of surgery.  Special instructions:   [place]- Preparing For Surgery  Before surgery, you can play an important role. Because skin  is not sterile, your skin needs to be as free of germs as possible. You can reduce the number of germs on your skin by washing with CHG (chlorahexidine gluconate) Soap before surgery.  CHG is an antiseptic cleaner which kills germs and bonds with the skin to continue killing germs even after washing.  Please do not use if you have an allergy to CHG or antibacterial soaps. If your skin becomes reddened/irritated stop using the CHG.  Do not shave (including legs and underarms) for at least 48 hours prior to first CHG shower. It is OK to shave your face.  Please follow these instructions carefully.   1. Shower the NIGHT BEFORE SURGERY and the MORNING OF SURGERY with CHG.   2. If you chose to wash your hair, wash your hair first as usual with your normal  shampoo.  3. After you shampoo, rinse your hair and body thoroughly to remove the shampoo.     Wash your face and private area with the soap you use at home, then rinse.  4. Use CHG as you would any other liquid soap. You can apply CHG directly to the skin and wash gently with a scrungie or a clean washcloth.   5. Apply the CHG Soap to your body ONLY FROM THE NECK DOWN.  Do not use on open wounds or open sores. Avoid contact with your eyes, ears, mouth and genitals (private parts). Wash Face and genitals (private parts)  with your normal soap.  6. Wash thoroughly, paying special attention to the area where your surgery will be performed.  7. Thoroughly rinse your body with warm water from the neck down.  8. DO NOT shower/wash with your normal soap after using and rinsing off the CHG Soap.  9. Pat yourself dry with a CLEAN TOWEL.  10. Wear CLEAN PAJAMAS to bed the night before surgery, wear comfortable clothes the morning of surgery  11. Place CLEAN SHEETS on your bed the night of your first shower and DO NOT SLEEP WITH PETS.   Day of Surgery:  Shower as Above Do not apply any deodorants/lotions, powders or colognes. Please wear clean clothes to the hospital/surgery center.    Do not wear jewelry, make-up or nail polish.  Do not shave 48 hours prior to surgery.  Men may shave face and neck.  Do not bring valuables to the hospital.  [facility] is not responsible for any belongings or valuables.  Contacts, dentures or bridgework may not be worn into surgery.  Leave your suitcase in the car.  After surgery it may be brought to your room.  For patients admitted to the hospital, discharge time will be determined by your treatment team.  Patients discharged the day of surgery will not be allowed to drive home.   Please read over the following fact sheets that you were given.  Pain Booklet, Patient Instructions for Mupirocin Application, Incentive Spirometry, Surgical Site  Infections.    Patient Signature:  Date:   Nurse Signature:  Date:

## 2017-11-02 NOTE — Progress Notes (Signed)
Anesthesia Chart Review:  Pt is a 62 year old male scheduled for left TKA on 11/13/2017 by Dr. Joni Fears.  History includes former smoker (quit '14), HTN, CAD/NSTEMI s/p DES LAD 04/2013 [facility] in [place], [place]), HLD, DM2, arthritis. BMI is consistent with obesity. S/p R TKA 05/08/17  PCP is Dr. Monico Blitz. Cardiologist is Dr. Kate Sable, last visit 04/16/17. He cleared pt for R TKA at that visit and noted ideally, he did not want ASA to be held for surgery given CAD history.  Meds include aspirin 81 mg, Lipitor, Coreg, lisinopril, metformin.  BP 124/77   Pulse 78   Temp 36.7 C   Resp 20   Ht 6' (1.829 m)   Wt 236 lb 5.3 oz (107.2 kg)   SpO2 98%   BMI 32.05 kg/m    EKG 04/16/17: SR.  Echo 05/04/13 [facility]): Summary: 1. Left ventricle: Size was normal. Systolic function was normal. Ejection fraction was estimated to be 55%. There were no regional wall motion abnormalities. Wall thickness was increased. Changes were consistent with concentric remodeling (increased wall thickness with normal wall mass). Left ventricular diastolic function parameters are normal for the patient's age. 2. Aortic valve: There was regurgitation. 3. Mitral valve: No regurgitation.  Cardiac cath 05/05/13 ([facility]; scanned under Media tab): Impression: 1. Severe one-vessel CAD.             A. Mid LAD 95% stenosis.             B. Left circumflex has mild luminal irregularities.             C. RCA has mild 20% stenosis. It is dominant. 2. Preserved left ventricular systolic function with ejection fraction 55%. 3. No mitral regurgitation or aortic stenosis. 4. Left ventricular end-diastolic pressure 17 mmHg. 5. Left anterior descending artery revascularization using a drug-eluting stent.  CXR 04/26/17: IMPRESSION: No active cardiopulmonary disease.  Preoperative labs noted. A1c 6.9. Urine culture pending  If no changes, I  anticipate pt can proceed with surgery as scheduled.   Willeen Cass, FNP-BC [facility] Short Stay Surgical Center/Anesthesiology Phone: [phone] 11/02/2017 4:46 PM

## 2017-11-03 LAB — URINE CULTURE: CULTURE: NO GROWTH

## 2017-11-12 ENCOUNTER — Telehealth (INDEPENDENT_AMBULATORY_CARE_PROVIDER_SITE_OTHER): Payer: Self-pay | Admitting: Orthopaedic Surgery

## 2017-11-12 MED ORDER — TRANEXAMIC ACID 1000 MG/10ML IV SOLN
2000.0000 mg | INTRAVENOUS | Status: AC
  Administered 2017-11-13: 2000 mg via TOPICAL
  Filled 2017-11-12: qty 20

## 2017-11-12 NOTE — Telephone Encounter (Signed)
Patient left a voicemail stating that he was eating chili cheese corn chips and had an allergic reaction which caused hives over his back and extremities.  He went to the Urgent care where he received a shot of Depo-Medrol.  Patient is scheduled for surgery tomorrow and wants to make sure this won't affect it.  Best number to reach him today is [phone]

## 2017-11-12 NOTE — Telephone Encounter (Signed)
Please advise 

## 2017-11-12 NOTE — Telephone Encounter (Signed)
called

## 2017-11-13 ENCOUNTER — Encounter (HOSPITAL_COMMUNITY): Payer: Self-pay | Admitting: Certified Registered Nurse Anesthetist

## 2017-11-13 ENCOUNTER — Inpatient Hospital Stay (HOSPITAL_COMMUNITY): Payer: Medicaid Other | Admitting: Certified Registered Nurse Anesthetist

## 2017-11-13 ENCOUNTER — Other Ambulatory Visit: Payer: Self-pay

## 2017-11-13 ENCOUNTER — Inpatient Hospital Stay (HOSPITAL_COMMUNITY)
Admission: RE | Admit: 2017-11-13 | Discharge: 2017-11-15 | DRG: 470 | Disposition: A | Discharge status: Home Health | Payer: Medicaid Other | Source: Ambulatory Visit | Attending: Orthopaedic Surgery | Admitting: Orthopaedic Surgery

## 2017-11-13 ENCOUNTER — Inpatient Hospital Stay (HOSPITAL_COMMUNITY): Payer: Medicaid Other | Admitting: Emergency Medicine

## 2017-11-13 ENCOUNTER — Encounter (HOSPITAL_COMMUNITY)
Admission: RE | Disposition: A | Discharge status: Home Health | Payer: Self-pay | Source: Ambulatory Visit | Attending: Orthopaedic Surgery

## 2017-11-13 DIAGNOSIS — M25762 Osteophyte, left knee: Secondary | ICD-10-CM | POA: Diagnosis present

## 2017-11-13 DIAGNOSIS — Z833 Family history of diabetes mellitus: Secondary | ICD-10-CM | POA: Diagnosis not present

## 2017-11-13 DIAGNOSIS — I2510 Atherosclerotic heart disease of native coronary artery without angina pectoris: Secondary | ICD-10-CM | POA: Diagnosis present

## 2017-11-13 DIAGNOSIS — Z7901 Long term (current) use of anticoagulants: Secondary | ICD-10-CM

## 2017-11-13 DIAGNOSIS — E785 Hyperlipidemia, unspecified: Secondary | ICD-10-CM | POA: Diagnosis present

## 2017-11-13 DIAGNOSIS — Z87891 Personal history of nicotine dependence: Secondary | ICD-10-CM

## 2017-11-13 DIAGNOSIS — Z96652 Presence of left artificial knee joint: Secondary | ICD-10-CM

## 2017-11-13 DIAGNOSIS — I252 Old myocardial infarction: Secondary | ICD-10-CM | POA: Diagnosis not present

## 2017-11-13 DIAGNOSIS — Z7982 Long term (current) use of aspirin: Secondary | ICD-10-CM | POA: Diagnosis not present

## 2017-11-13 DIAGNOSIS — Z96651 Presence of right artificial knee joint: Secondary | ICD-10-CM | POA: Diagnosis present

## 2017-11-13 DIAGNOSIS — Z91018 Allergy to other foods: Secondary | ICD-10-CM

## 2017-11-13 DIAGNOSIS — I10 Essential (primary) hypertension: Secondary | ICD-10-CM | POA: Diagnosis present

## 2017-11-13 DIAGNOSIS — M1712 Unilateral primary osteoarthritis, left knee: Principal | ICD-10-CM | POA: Diagnosis present

## 2017-11-13 DIAGNOSIS — Z7984 Long term (current) use of oral hypoglycemic drugs: Secondary | ICD-10-CM | POA: Diagnosis not present

## 2017-11-13 DIAGNOSIS — Z79899 Other long term (current) drug therapy: Secondary | ICD-10-CM

## 2017-11-13 DIAGNOSIS — Z8249 Family history of ischemic heart disease and other diseases of the circulatory system: Secondary | ICD-10-CM

## 2017-11-13 DIAGNOSIS — E119 Type 2 diabetes mellitus without complications: Secondary | ICD-10-CM | POA: Diagnosis present

## 2017-11-13 HISTORY — PX: TOTAL KNEE ARTHROPLASTY: SHX125

## 2017-11-13 HISTORY — DX: Type 2 diabetes mellitus without complications: E11.9

## 2017-11-13 LAB — GLUCOSE, CAPILLARY
GLUCOSE-CAPILLARY: 122 mg/dL — AB (ref 65–99)
GLUCOSE-CAPILLARY: 124 mg/dL — AB (ref 65–99)
GLUCOSE-CAPILLARY: 155 mg/dL — AB (ref 65–99)
Glucose-Capillary: 187 mg/dL — ABNORMAL HIGH (ref 65–99)

## 2017-11-13 LAB — HEMOGLOBIN A1C
Hgb A1c MFr Bld: 6.8 % — ABNORMAL HIGH (ref 4.8–5.6)
MEAN PLASMA GLUCOSE: 148.46 mg/dL

## 2017-11-13 SURGERY — ARTHROPLASTY, KNEE, TOTAL
Anesthesia: Spinal | Laterality: Left

## 2017-11-13 MED ORDER — MIDAZOLAM HCL 2 MG/2ML IJ SOLN
2.0000 mg | Freq: Once | INTRAMUSCULAR | Status: AC
  Administered 2017-11-13: 2 mg via INTRAVENOUS

## 2017-11-13 MED ORDER — BUPIVACAINE-EPINEPHRINE 0.5% -1:200000 IJ SOLN
INTRAMUSCULAR | Status: DC | PRN
  Administered 2017-11-13: 30 mg

## 2017-11-13 MED ORDER — CEFAZOLIN SODIUM-DEXTROSE 2-4 GM/100ML-% IV SOLN
2.0000 g | Freq: Four times a day (QID) | INTRAVENOUS | Status: AC
  Administered 2017-11-13 (×2): 2 g via INTRAVENOUS
  Filled 2017-11-13 (×2): qty 100

## 2017-11-13 MED ORDER — ROPIVACAINE HCL 5 MG/ML IJ SOLN
INTRAMUSCULAR | Status: DC | PRN
  Administered 2017-11-13: 30 mL via PERINEURAL

## 2017-11-13 MED ORDER — CARVEDILOL 12.5 MG PO TABS
ORAL_TABLET | ORAL | Status: AC
  Administered 2017-11-13: 12.5 mg via ORAL
  Filled 2017-11-13: qty 1

## 2017-11-13 MED ORDER — DOCUSATE SODIUM 100 MG PO CAPS
100.0000 mg | ORAL_CAPSULE | Freq: Two times a day (BID) | ORAL | Status: DC
  Administered 2017-11-13 – 2017-11-15 (×4): 100 mg via ORAL
  Filled 2017-11-13 (×4): qty 1

## 2017-11-13 MED ORDER — INSULIN ASPART 100 UNIT/ML ~~LOC~~ SOLN: 0.0000 [IU] | Freq: Every day | SUBCUTANEOUS | Status: DC

## 2017-11-13 MED ORDER — ONDANSETRON HCL 4 MG PO TABS: 4.0000 mg | ORAL_TABLET | Freq: Four times a day (QID) | ORAL | Status: DC | PRN

## 2017-11-13 MED ORDER — LIDOCAINE 2% (20 MG/ML) 5 ML SYRINGE
INTRAMUSCULAR | Status: DC | PRN
  Administered 2017-11-13: 60 mg via INTRAVENOUS

## 2017-11-13 MED ORDER — CEFAZOLIN SODIUM-DEXTROSE 2-4 GM/100ML-% IV SOLN
2.0000 g | INTRAVENOUS | Status: AC
  Administered 2017-11-13: 2 g via INTRAVENOUS
  Filled 2017-11-13: qty 100

## 2017-11-13 MED ORDER — MENTHOL 3 MG MT LOZG: 1.0000 | LOZENGE | OROMUCOSAL | Status: DC | PRN

## 2017-11-13 MED ORDER — ACETAMINOPHEN 10 MG/ML IV SOLN
1000.0000 mg | Freq: Once | INTRAVENOUS | Status: AC
  Administered 2017-11-13: 1000 mg via INTRAVENOUS
  Filled 2017-11-13: qty 100

## 2017-11-13 MED ORDER — BUPIVACAINE IN DEXTROSE 0.75-8.25 % IT SOLN
INTRATHECAL | Status: DC | PRN
  Administered 2017-11-13: 2 mL via INTRATHECAL

## 2017-11-13 MED ORDER — OXYCODONE HCL 5 MG PO TABS: 5.0000 mg | ORAL_TABLET | ORAL | Status: DC | PRN

## 2017-11-13 MED ORDER — INFLUENZA VAC SPLIT QUAD 0.5 ML IM SUSY: 0.5000 mL | PREFILLED_SYRINGE | INTRAMUSCULAR | Status: DC | PRN

## 2017-11-13 MED ORDER — FENTANYL CITRATE (PF) 100 MCG/2ML IJ SOLN: 25.0000 ug | INTRAMUSCULAR | Status: DC | PRN

## 2017-11-13 MED ORDER — HYDROMORPHONE HCL 1 MG/ML IJ SOLN
0.5000 mg | INTRAMUSCULAR | Status: DC | PRN
  Administered 2017-11-14 – 2017-11-15 (×3): 1 mg via INTRAVENOUS
  Filled 2017-11-13 (×4): qty 1

## 2017-11-13 MED ORDER — PROPOFOL 10 MG/ML IV BOLUS
INTRAVENOUS | Status: DC | PRN
  Administered 2017-11-13: 20 mg via INTRAVENOUS

## 2017-11-13 MED ORDER — SODIUM CHLORIDE 0.9 % IR SOLN
Status: DC | PRN
  Administered 2017-11-13: 3000 mL

## 2017-11-13 MED ORDER — ONDANSETRON HCL 4 MG/2ML IJ SOLN: 4.0000 mg | Freq: Four times a day (QID) | INTRAMUSCULAR | Status: DC | PRN

## 2017-11-13 MED ORDER — PHENYLEPHRINE HCL 10 MG/ML IJ SOLN
INTRAVENOUS | Status: DC | PRN
  Administered 2017-11-13: 20 ug/min via INTRAVENOUS

## 2017-11-13 MED ORDER — ACETAMINOPHEN 10 MG/ML IV SOLN
1000.0000 mg | Freq: Four times a day (QID) | INTRAVENOUS | Status: AC
  Administered 2017-11-13 – 2017-11-14 (×4): 1000 mg via INTRAVENOUS
  Filled 2017-11-13 (×4): qty 100

## 2017-11-13 MED ORDER — METHOCARBAMOL 500 MG PO TABS: 500.0000 mg | ORAL_TABLET | Freq: Four times a day (QID) | ORAL | Status: DC | PRN

## 2017-11-13 MED ORDER — CARVEDILOL 12.5 MG PO TABS
12.5000 mg | ORAL_TABLET | Freq: Once | ORAL | Status: AC
  Administered 2017-11-13: 12.5 mg via ORAL

## 2017-11-13 MED ORDER — MIDAZOLAM HCL 2 MG/2ML IJ SOLN
INTRAMUSCULAR | Status: AC
  Administered 2017-11-13: 2 mg via INTRAVENOUS
  Filled 2017-11-13: qty 2

## 2017-11-13 MED ORDER — DEXTROSE 5 % IV SOLN
500.0000 mg | Freq: Four times a day (QID) | INTRAVENOUS | Status: DC | PRN
  Filled 2017-11-13: qty 5

## 2017-11-13 MED ORDER — CARVEDILOL 12.5 MG PO TABS
12.5000 mg | ORAL_TABLET | Freq: Two times a day (BID) | ORAL | Status: DC
  Administered 2017-11-13 – 2017-11-15 (×4): 12.5 mg via ORAL
  Filled 2017-11-13 (×4): qty 1

## 2017-11-13 MED ORDER — PHENOL 1.4 % MT LIQD: 1.0000 | OROMUCOSAL | Status: DC | PRN

## 2017-11-13 MED ORDER — METOCLOPRAMIDE HCL 5 MG/ML IJ SOLN: 10.0000 mg | Freq: Once | INTRAMUSCULAR | Status: DC | PRN

## 2017-11-13 MED ORDER — FENTANYL CITRATE (PF) 100 MCG/2ML IJ SOLN
INTRAMUSCULAR | Status: AC
  Administered 2017-11-13: 100 ug via INTRAVENOUS
  Filled 2017-11-13: qty 2

## 2017-11-13 MED ORDER — POLYETHYLENE GLYCOL 3350 17 G PO PACK: 17.0000 g | PACK | Freq: Every day | ORAL | Status: DC | PRN

## 2017-11-13 MED ORDER — PROPOFOL 10 MG/ML IV BOLUS
INTRAVENOUS | Status: AC
  Filled 2017-11-13: qty 20

## 2017-11-13 MED ORDER — DEXAMETHASONE SODIUM PHOSPHATE 10 MG/ML IJ SOLN
INTRAMUSCULAR | Status: DC | PRN
  Administered 2017-11-13: 10 mg via INTRAVENOUS

## 2017-11-13 MED ORDER — TAMSULOSIN HCL 0.4 MG PO CAPS
0.4000 mg | ORAL_CAPSULE | Freq: Every day | ORAL | Status: DC
  Administered 2017-11-13 – 2017-11-14 (×2): 0.4 mg via ORAL
  Filled 2017-11-13 (×2): qty 1

## 2017-11-13 MED ORDER — KETOROLAC TROMETHAMINE 15 MG/ML IJ SOLN
15.0000 mg | Freq: Four times a day (QID) | INTRAMUSCULAR | Status: AC
  Administered 2017-11-13 – 2017-11-14 (×4): 15 mg via INTRAVENOUS
  Filled 2017-11-13 (×3): qty 1

## 2017-11-13 MED ORDER — CHLORHEXIDINE GLUCONATE 4 % EX LIQD: 60.0000 mL | Freq: Once | CUTANEOUS | Status: DC

## 2017-11-13 MED ORDER — BUPIVACAINE-EPINEPHRINE (PF) 0.5% -1:200000 IJ SOLN
INTRAMUSCULAR | Status: AC
  Filled 2017-11-13: qty 30

## 2017-11-13 MED ORDER — MEPERIDINE HCL 25 MG/ML IJ SOLN: 6.2500 mg | INTRAMUSCULAR | Status: DC | PRN

## 2017-11-13 MED ORDER — KETOROLAC TROMETHAMINE 15 MG/ML IJ SOLN
INTRAMUSCULAR | Status: AC
  Administered 2017-11-13: 15 mg via INTRAVENOUS
  Filled 2017-11-13: qty 1

## 2017-11-13 MED ORDER — OXYCODONE HCL 5 MG PO TABS
10.0000 mg | ORAL_TABLET | ORAL | Status: DC | PRN
  Administered 2017-11-14 – 2017-11-15 (×3): 10 mg via ORAL
  Filled 2017-11-13 (×3): qty 2

## 2017-11-13 MED ORDER — ATORVASTATIN CALCIUM 40 MG PO TABS
40.0000 mg | ORAL_TABLET | Freq: Every day | ORAL | Status: DC
  Administered 2017-11-13 – 2017-11-14 (×2): 40 mg via ORAL
  Filled 2017-11-13 (×2): qty 1

## 2017-11-13 MED ORDER — RIVAROXABAN 10 MG PO TABS
10.0000 mg | ORAL_TABLET | Freq: Every day | ORAL | Status: DC
  Administered 2017-11-14 – 2017-11-15 (×2): 10 mg via ORAL
  Filled 2017-11-13 (×2): qty 1

## 2017-11-13 MED ORDER — LISINOPRIL 20 MG PO TABS
20.0000 mg | ORAL_TABLET | Freq: Every day | ORAL | Status: DC
  Administered 2017-11-13 – 2017-11-14 (×2): 20 mg via ORAL
  Filled 2017-11-13 (×2): qty 1

## 2017-11-13 MED ORDER — SODIUM CHLORIDE 0.9 % IV SOLN: INTRAVENOUS | Status: DC

## 2017-11-13 MED ORDER — LACTATED RINGERS IV SOLN: INTRAVENOUS | Status: DC

## 2017-11-13 MED ORDER — 0.9 % SODIUM CHLORIDE (POUR BTL) OPTIME
TOPICAL | Status: DC | PRN
  Administered 2017-11-13: 1000 mL

## 2017-11-13 MED ORDER — MIDAZOLAM HCL 2 MG/2ML IJ SOLN
INTRAMUSCULAR | Status: DC | PRN
  Administered 2017-11-13 (×2): 1 mg via INTRAVENOUS

## 2017-11-13 MED ORDER — INSULIN ASPART 100 UNIT/ML ~~LOC~~ SOLN
0.0000 [IU] | Freq: Three times a day (TID) | SUBCUTANEOUS | Status: DC
  Administered 2017-11-13: 3 [IU] via SUBCUTANEOUS
  Administered 2017-11-14: 2 [IU] via SUBCUTANEOUS
  Administered 2017-11-14: 3 [IU] via SUBCUTANEOUS
  Administered 2017-11-15: 2 [IU] via SUBCUTANEOUS

## 2017-11-13 MED ORDER — FENTANYL CITRATE (PF) 100 MCG/2ML IJ SOLN
100.0000 ug | Freq: Once | INTRAMUSCULAR | Status: AC
  Administered 2017-11-13: 100 ug via INTRAVENOUS

## 2017-11-13 MED ORDER — PNEUMOCOCCAL VAC POLYVALENT 25 MCG/0.5ML IJ INJ: 0.5000 mL | INJECTION | INTRAMUSCULAR | Status: DC | PRN

## 2017-11-13 MED ORDER — MAGNESIUM CITRATE PO SOLN: 1.0000 | Freq: Once | ORAL | Status: DC | PRN

## 2017-11-13 MED ORDER — BISACODYL 10 MG RE SUPP: 10.0000 mg | Freq: Every day | RECTAL | Status: DC | PRN

## 2017-11-13 MED ORDER — DIPHENHYDRAMINE HCL 12.5 MG/5ML PO ELIX: 12.5000 mg | ORAL_SOLUTION | ORAL | Status: DC | PRN

## 2017-11-13 MED ORDER — PROPOFOL 500 MG/50ML IV EMUL
INTRAVENOUS | Status: DC | PRN
  Administered 2017-11-13: 75 ug/kg/min via INTRAVENOUS
  Administered 2017-11-13: 13:00:00 via INTRAVENOUS

## 2017-11-13 MED ORDER — LACTATED RINGERS IV SOLN
INTRAVENOUS | Status: DC
  Administered 2017-11-13 (×2): via INTRAVENOUS

## 2017-11-13 MED ORDER — MIDAZOLAM HCL 2 MG/2ML IJ SOLN
INTRAMUSCULAR | Status: AC
  Filled 2017-11-13: qty 2

## 2017-11-13 MED ORDER — ALUM & MAG HYDROXIDE-SIMETH 200-200-20 MG/5ML PO SUSP: 30.0000 mL | ORAL | Status: DC | PRN

## 2017-11-13 MED ORDER — ONDANSETRON HCL 4 MG/2ML IJ SOLN
INTRAMUSCULAR | Status: DC | PRN
  Administered 2017-11-13: 4 mg via INTRAVENOUS

## 2017-11-13 SURGICAL SUPPLY — 69 items
BAG DECANTER FOR FLEXI CONT (MISCELLANEOUS) ×3 IMPLANT
BANDAGE ESMARK 6X9 LF (GAUZE/BANDAGES/DRESSINGS) ×1 IMPLANT
BLADE SAGITTAL 25.0X1.19X90 (BLADE) ×2 IMPLANT
BLADE SAGITTAL 25.0X1.19X90MM (BLADE) ×1
BNDG ESMARK 6X9 LF (GAUZE/BANDAGES/DRESSINGS) ×3
BOWL SMART MIX CTS (DISPOSABLE) ×3 IMPLANT
CAP KNEE TOTAL 3 SIGMA ×3 IMPLANT
CEMENT HV SMART SET (Cement) ×6 IMPLANT
COVER SURGICAL LIGHT HANDLE (MISCELLANEOUS) ×6 IMPLANT
CUFF TOURNIQUET SINGLE 34IN LL (TOURNIQUET CUFF) ×3 IMPLANT
CUFF TOURNIQUET SINGLE 44IN (TOURNIQUET CUFF) IMPLANT
DECANTER SPIKE VIAL GLASS SM (MISCELLANEOUS) IMPLANT
DRAPE EXTREMITY T 121X128X90 (DRAPE) ×3 IMPLANT
DRAPE HALF SHEET 40X57 (DRAPES) ×6 IMPLANT
DRAPE UNIVERSAL PACK (DRAPES) ×3 IMPLANT
DRSG ADAPTIC 3X8 NADH LF (GAUZE/BANDAGES/DRESSINGS) ×3 IMPLANT
DRSG PAD ABDOMINAL 8X10 ST (GAUZE/BANDAGES/DRESSINGS) ×6 IMPLANT
DURAPREP 26ML APPLICATOR (WOUND CARE) ×6 IMPLANT
ELECT CAUTERY BLADE 6.4 (BLADE) ×3 IMPLANT
ELECT REM PT RETURN 9FT ADLT (ELECTROSURGICAL) ×3
ELECTRODE REM PT RTRN 9FT ADLT (ELECTROSURGICAL) ×1 IMPLANT
EVACUATOR 1/8 PVC DRAIN (DRAIN) IMPLANT
FACESHIELD WRAPAROUND (MASK) ×6 IMPLANT
GAUZE SPONGE 4X4 12PLY STRL (GAUZE/BANDAGES/DRESSINGS) ×3 IMPLANT
GLOVE BIOGEL PI IND STRL 6.5 (GLOVE) ×1 IMPLANT
GLOVE BIOGEL PI IND STRL 7.0 (GLOVE) ×1 IMPLANT
GLOVE BIOGEL PI IND STRL 8 (GLOVE) ×1 IMPLANT
GLOVE BIOGEL PI IND STRL 8.5 (GLOVE) ×1 IMPLANT
GLOVE BIOGEL PI INDICATOR 6.5 (GLOVE) ×2
GLOVE BIOGEL PI INDICATOR 7.0 (GLOVE) ×2
GLOVE BIOGEL PI INDICATOR 8 (GLOVE) ×2
GLOVE BIOGEL PI INDICATOR 8.5 (GLOVE) ×2
GLOVE ECLIPSE 8.0 STRL XLNG CF (GLOVE) ×6 IMPLANT
GLOVE ECLIPSE 8.5 STRL (GLOVE) ×6 IMPLANT
GLOVE SURG SS PI 6.5 STRL IVOR (GLOVE) ×12 IMPLANT
GOWN STRL REUS W/ TWL LRG LVL3 (GOWN DISPOSABLE) ×2 IMPLANT
GOWN STRL REUS W/TWL 2XL LVL3 (GOWN DISPOSABLE) ×3 IMPLANT
GOWN STRL REUS W/TWL LRG LVL3 (GOWN DISPOSABLE) ×4
HANDPIECE INTERPULSE COAX TIP (DISPOSABLE) ×2
HOOD W/PEELAWAY (MISCELLANEOUS) ×3 IMPLANT
KIT BASIN OR (CUSTOM PROCEDURE TRAY) ×3 IMPLANT
KIT ROOM TURNOVER OR (KITS) ×3 IMPLANT
MANIFOLD NEPTUNE II (INSTRUMENTS) ×3 IMPLANT
NEEDLE 22X1 1/2 (OR ONLY) (NEEDLE) ×3 IMPLANT
NS IRRIG 1000ML POUR BTL (IV SOLUTION) ×3 IMPLANT
PACK TOTAL JOINT (CUSTOM PROCEDURE TRAY) ×3 IMPLANT
PAD ABD 8X10 STRL (GAUZE/BANDAGES/DRESSINGS) ×6 IMPLANT
PAD ARMBOARD 7.5X6 YLW CONV (MISCELLANEOUS) ×6 IMPLANT
PAD CAST 4YDX4 CTTN HI CHSV (CAST SUPPLIES) ×1 IMPLANT
PADDING CAST COTTON 4X4 STRL (CAST SUPPLIES) ×2
PADDING CAST COTTON 6X4 STRL (CAST SUPPLIES) ×3 IMPLANT
SET HNDPC FAN SPRY TIP SCT (DISPOSABLE) ×1 IMPLANT
SPONGE LAP 18X18 X RAY DECT (DISPOSABLE) ×6 IMPLANT
STAPLER VISISTAT 35W (STAPLE) ×3 IMPLANT
SUCTION FRAZIER HANDLE 10FR (MISCELLANEOUS) ×2
SUCTION TUBE FRAZIER 10FR DISP (MISCELLANEOUS) ×1 IMPLANT
SURGIFLO W/THROMBIN 8M KIT (HEMOSTASIS) IMPLANT
SUT BONE WAX W31G (SUTURE) ×3 IMPLANT
SUT ETHIBOND NAB CT1 #1 30IN (SUTURE) ×6 IMPLANT
SUT MNCRL AB 3-0 PS2 18 (SUTURE) ×3 IMPLANT
SUT VIC AB 0 CT1 27 (SUTURE) ×2
SUT VIC AB 0 CT1 27XBRD ANBCTR (SUTURE) ×1 IMPLANT
SYR CONTROL 10ML LL (SYRINGE) IMPLANT
TOWEL OR 17X24 6PK STRL BLUE (TOWEL DISPOSABLE) ×3 IMPLANT
TOWEL OR 17X26 10 PK STRL BLUE (TOWEL DISPOSABLE) ×3 IMPLANT
TRAY CATH 16FR W/PLASTIC CATH (SET/KITS/TRAYS/PACK) ×3 IMPLANT
TRAY FOLEY BAG SILVER LF 16FR (SET/KITS/TRAYS/PACK) IMPLANT
WRAP KNEE MAXI GEL POST OP (GAUZE/BANDAGES/DRESSINGS) ×3 IMPLANT
YANKAUER SUCT BULB TIP NO VENT (SUCTIONS) ×6 IMPLANT

## 2017-11-13 NOTE — Progress Notes (Signed)
Orthopedic Tech Progress Note Patient Details:  Darius Rogers 17-Apr-1956 [id number]  Ortho Devices Ortho Device/Splint Interventions: Application   Post Interventions Patient Tolerated: Well Instructions Provided: Care of device, Adjustment of device   [place] Pink 11/13/2017, 5:22 PM

## 2017-11-13 NOTE — Progress Notes (Signed)
The recent History & Physical has been reviewed. I have personally examined the patient today. There is no interval change to the documented History & Physical. The patient would like to proceed with the procedure.  Garald Balding 11/13/2017,  10:00 AM  Patient ID: Darius Rogers, male   DOB: [birth year], 62 y.o.   MRN: [id number]

## 2017-11-13 NOTE — Anesthesia Procedure Notes (Signed)
Procedure Name: MAC Date/Time: 11/13/2017 10:45 AM Performed by: Candis Shine, CRNA Pre-anesthesia Checklist: Patient identified, Emergency Drugs available, Suction available, Patient being monitored and Timeout performed Patient Re-evaluated:Patient Re-evaluated prior to induction Oxygen Delivery Method: Simple face mask Dental Injury: Teeth and Oropharynx as per pre-operative assessment

## 2017-11-13 NOTE — Anesthesia Postprocedure Evaluation (Signed)
Anesthesia Post Note  Patient: Darius Rogers  Procedure(s) Performed: LEFT TOTAL KNEE ARTHROPLASTY (Left )     Patient location during evaluation: PACU Anesthesia Type: Spinal Level of consciousness: awake and alert Pain management: pain level controlled Vital Signs Assessment: post-procedure vital signs reviewed and stable Respiratory status: spontaneous breathing and respiratory function stable Cardiovascular status: blood pressure returned to baseline and stable Postop Assessment: no headache, no backache, spinal receding and no apparent nausea or vomiting Anesthetic complications: no    Last Vitals:  Vitals:   11/13/17 1345 11/13/17 1347  BP:  103/67  Pulse: 60 60  Resp: 10 13  Temp:    SpO2: 93% 92%    Last Pain:  Vitals:   11/13/17 1333  TempSrc:   PainSc: 0-No pain                 Montez Hageman

## 2017-11-13 NOTE — Anesthesia Procedure Notes (Signed)
Spinal  Patient location during procedure: OR Start time: 11/13/2017 10:55 AM End time: 11/13/2017 11:00 AM Staffing Anesthesiologist: Lynda Rainwater, MD Performed: anesthesiologist  Preanesthetic Checklist Completed: patient identified, site marked, surgical consent, pre-op evaluation, timeout performed, IV checked, risks and benefits discussed and monitors and equipment checked Spinal Block Patient position: sitting Prep: Betadine Patient monitoring: heart rate, cardiac monitor, continuous pulse ox and blood pressure Approach: midline Location: L3-4 Injection technique: single-shot Needle Needle type: Pencan  Needle gauge: 24 G Needle length: 9 cm

## 2017-11-13 NOTE — Anesthesia Preprocedure Evaluation (Signed)
Anesthesia Evaluation  Patient identified by MRN, date of birth, ID band Patient awake    Reviewed: Allergy & Precautions, NPO status , Patient's Chart, lab work & pertinent test results  Airway Mallampati: II  TM Distance: >3 FB Neck ROM: Full    Dental  (+) Teeth Intact, Dental Advisory Given   Pulmonary neg pulmonary ROS, former smoker,    breath sounds clear to auscultation       Cardiovascular hypertension, Pt. on home beta blockers and Pt. on medications + CAD, + Past MI and + Cardiac Stents  negative cardio ROS   Rhythm:Regular Rate:Normal     Neuro/Psych negative neurological ROS     GI/Hepatic negative GI ROS, Neg liver ROS,   Endo/Other  diabetes, Type 2, Oral Hypoglycemic Agents  Renal/GU negative Renal ROS     Musculoskeletal  (+) Arthritis , Osteoarthritis,    Abdominal   Peds  Hematology negative hematology ROS (+)   Anesthesia Other Findings Day of surgery medications reviewed with the patient.  Reproductive/Obstetrics                             Lab Results  Component Value Date   WBC 8.1 11/01/2017   HGB 16.4 11/01/2017   HCT 49.4 11/01/2017   MCV 89.8 11/01/2017   PLT 257 11/01/2017   Lab Results  Component Value Date   CREATININE 1.18 11/01/2017   BUN 8 11/01/2017   NA 135 11/01/2017   K 4.1 11/01/2017   CL 102 11/01/2017   CO2 22 11/01/2017   Lab Results  Component Value Date   INR 0.92 11/01/2017   INR 1.03 04/26/2017   EKG: normal sinus rhythm.  Anesthesia Physical  Anesthesia Plan  ASA: II  Anesthesia Plan: Spinal   Post-op Pain Management:  Regional for Post-op pain   Induction: Intravenous  PONV Risk Score and Plan: 2 and Ondansetron and Dexamethasone  Airway Management Planned: Nasal Cannula  Additional Equipment:   Intra-op Plan:   Post-operative Plan:   Informed Consent: I have reviewed the patients History and Physical,  chart, labs and discussed the procedure including the risks, benefits and alternatives for the proposed anesthesia with the patient or authorized representative who has indicated his/her understanding and acceptance.     Plan Discussed with: CRNA  Anesthesia Plan Comments:         Anesthesia Quick Evaluation

## 2017-11-13 NOTE — Transfer of Care (Signed)
Immediate Anesthesia Transfer of Care Note  Patient: Darius Rogers  Procedure(s) Performed: LEFT TOTAL KNEE ARTHROPLASTY (Left )  Patient Location: PACU  Anesthesia Type:Spinal and MAC combined with regional for post-op pain  Level of Consciousness: awake, alert  and oriented  Airway & Oxygen Therapy: Patient Spontanous Breathing and Patient connected to face mask oxygen  Post-op Assessment: Report given to RN and Post -op Vital signs reviewed and stable  Post vital signs: Reviewed and stable  Last Vitals:  Vitals:   11/13/17 1332 11/13/17 1333  BP: 105/69   Pulse: (!) 57   Resp: 11   Temp:  (!) 36.3 C  SpO2: 96%     Last Pain:  Vitals:   11/13/17 1333  TempSrc:   PainSc: 0-No pain         Complications: No apparent anesthesia complications

## 2017-11-13 NOTE — Progress Notes (Signed)
Orthopedic Tech Progress Note Patient Details:  Darius Rogers 07-Sep-1956 [id number]  CPM Left Knee CPM Left Knee: On Left Knee Flexion (Degrees): 90 Left Knee Extension (Degrees): 0  Post Interventions Patient Tolerated: Well Instructions Provided: Care of device, Adjustment of device  [place] Pink 11/13/2017, 3:04 PM

## 2017-11-13 NOTE — Anesthesia Procedure Notes (Addendum)
Anesthesia Regional Block: Adductor canal block   Pre-Anesthetic Checklist: ,, timeout performed, Correct Patient, Correct Site, Correct Laterality, Correct Procedure, Correct Position, site marked, Risks and benefits discussed,  Surgical consent,  Pre-op evaluation,  At surgeon's request and post-op pain management  Laterality: Left and Lower  Prep: Maximum Sterile Barrier Precautions used, chloraprep       Needles:  Injection technique: Single-shot  Needle Type: Echogenic Stimulator Needle     Needle Length: 10cm      Additional Needles:   Procedures:,,,, ultrasound used (permanent image in chart),,,,  Narrative:  Start time: 11/13/2017 8:40 AM End time: 11/13/2017 8:50 AM Injection made incrementally with aspirations every 5 mL.  Performed by: Personally  Anesthesiologist: Montez Hageman, MD  Additional Notes: Risks, benefits and alternative to block explained extensively.  Patient tolerated procedure well, without complications.

## 2017-11-13 NOTE — Progress Notes (Signed)
PATIENT ID:      Jarious Lyon  MRN:     [id number] DOB/AGE:    [birth year] / 62 y.o.       OPERATIVE REPORT    DATE OF PROCEDURE:  11/13/2017       PREOPERATIVE DIAGNOSIS: End Stage  Osteoarthritis Left Knee                                                       Estimated body mass index is 32.05 kg/m as calculated from the following:   Height as of 11/01/17: 6' (1.829 m).   Weight as of 11/01/17: 236 lb 5.3 oz (107.2 kg).     POSTOPERATIVE DIAGNOSIS:End Stage   Osteoarthritis Left Knee                                                                     Estimated body mass index is 32.05 kg/m as calculated from the following:   Height as of 11/01/17: 6' (1.829 m).   Weight as of 11/01/17: 236 lb 5.3 oz (107.2 kg).     PROCEDURE:  Procedure(s): LEFT TOTAL KNEE ARTHROPLASTY      SURGEON:  Joni Fears, MD    ASSISTANT:   Biagio Borg, PA-C   (Present and scrubbed throughout the case, critical for assistance with exposure, retraction, instrumentation, and closure.)          ANESTHESIA: spinal and IV sedation     DRAINS: none :      TOURNIQUET TIME:  Total Tourniquet Time Documented: Thigh (Left) - 87 minutes Total: Thigh (Left) - 87 minutes     COMPLICATIONS:  None   CONDITION:  stable  PROCEDURE IN DETAIL: [place] 11/13/2017, 1:06 PM  Patient ID: [name], male   DOB: [birth year], 62 y.o.   MRN: [id number]

## 2017-11-14 ENCOUNTER — Encounter (HOSPITAL_COMMUNITY): Payer: Self-pay | Admitting: Orthopaedic Surgery

## 2017-11-14 LAB — BASIC METABOLIC PANEL
ANION GAP: 13 (ref 5–15)
BUN: 15 mg/dL (ref 6–20)
CALCIUM: 8.3 mg/dL — AB (ref 8.9–10.3)
CO2: 19 mmol/L — ABNORMAL LOW (ref 22–32)
Chloride: 104 mmol/L (ref 101–111)
Creatinine, Ser: 0.97 mg/dL (ref 0.61–1.24)
GLUCOSE: 143 mg/dL — AB (ref 65–99)
POTASSIUM: 3.8 mmol/L (ref 3.5–5.1)
Sodium: 136 mmol/L (ref 135–145)

## 2017-11-14 LAB — CBC
HCT: 40.1 % (ref 39.0–52.0)
Hemoglobin: 13 g/dL (ref 13.0–17.0)
MCH: 29 pg (ref 26.0–34.0)
MCHC: 32.4 g/dL (ref 30.0–36.0)
MCV: 89.5 fL (ref 78.0–100.0)
PLATELETS: 260 10*3/uL (ref 150–400)
RBC: 4.48 MIL/uL (ref 4.22–5.81)
RDW: 14.2 % (ref 11.5–15.5)
WBC: 18.3 10*3/uL — AB (ref 4.0–10.5)

## 2017-11-14 LAB — GLUCOSE, CAPILLARY
GLUCOSE-CAPILLARY: 127 mg/dL — AB (ref 65–99)
GLUCOSE-CAPILLARY: 167 mg/dL — AB (ref 65–99)
GLUCOSE-CAPILLARY: 118 mg/dL — AB (ref 65–99)
Glucose-Capillary: 154 mg/dL — ABNORMAL HIGH (ref 65–99)

## 2017-11-14 NOTE — Evaluation (Signed)
Occupational Therapy Evaluation and Discharge Patient Details Name: Darius Rogers MRN: [id number] DOB: April 14, 1956 Today's Date: 11/14/2017    History of Present Illness 62 y.o. male s/p L TKA. PMH includes: MI, CAD, HTN, DM2, R TKA, Forearm fx surgery.    Clinical Impression   PTA, pt had returned to independence for ADL and functional mobility s/p R TKA. Pt currently able to complete standing grooming tasks, LB ADL, toilet transfers, and shower transfers with supervision to ensure safety. Education complete concerning knee precautions related to ADL, fall prevention, and home modifications to maximize safety. He verbalizes and demonstrates understanding of all topics. Pt reports no further questions or concerns and has no further OT needs at this time. He has all equipment necessary. Acute OT will sign off.     Follow Up Recommendations  No OT follow up;Supervision/Assistance - 24 hour(initial)    Equipment Recommendations  None recommended by OT    Recommendations for Other Services       Precautions / Restrictions Precautions Precautions: Knee Precaution Booklet Issued: No Precaution Comments: Reviewed fall prevention, knee precautions related to ADL, no pillow under knee, and use of ice for pain management. Restrictions Weight Bearing Restrictions: Yes RUE Weight Bearing: Weight bearing as tolerated LLE Weight Bearing: Partial weight bearing LLE Partial Weight Bearing Percentage or Pounds: 50%      Mobility Bed Mobility Overal bed mobility: Modified Independent                Transfers Overall transfer level: Needs assistance Equipment used: Rolling walker (2 wheeled) Transfers: Sit to/from Stand Sit to Stand: Supervision         General transfer comment: Supervision for safety.     Balance Overall balance assessment: Needs assistance Sitting-balance support: Feet unsupported;No upper extremity supported Sitting balance-Leahy Scale: Good     Standing  balance support: During functional activity;Bilateral upper extremity supported Standing balance-Leahy Scale: Fair Standing balance comment: RW for dynamic mobility                            ADL either performed or assessed with clinical judgement   ADL Overall ADL's : Needs assistance/impaired Eating/Feeding: Sitting;Set up   Grooming: Supervision/safety;Standing   Upper Body Bathing: Set up;Sitting   Lower Body Bathing: Supervison/ safety;Sit to/from stand   Upper Body Dressing : Set up;Sitting   Lower Body Dressing: Supervision/safety;Sit to/from stand   Toilet Transfer: Supervision/safety;Ambulation;RW   Toileting- Clothing Manipulation and Hygiene: Supervision/safety;Sit to/from stand   Tub/ Shower Transfer: Supervision/safety;Ambulation;Shower seat;Rolling walker   Functional mobility during ADLs: Supervision/safety;Rolling walker General ADL Comments: Educated pt concerning compensatory ADL strategies as well as safe shower transfers. Pt demonstrates good understanding.      Vision Patient Visual Report: No change from baseline Vision Assessment?: No apparent visual deficits     Perception     Praxis      Pertinent Vitals/Pain Pain Assessment: Faces Faces Pain Scale: Hurts a little bit Pain Location: L knee Pain Descriptors / Indicators: Grimacing;Discomfort Pain Intervention(s): Limited activity within patient's tolerance;Monitored during session;Repositioned     Hand Dominance Right   Extremity/Trunk Assessment Upper Extremity Assessment Upper Extremity Assessment: Overall WFL for tasks assessed   Lower Extremity Assessment Lower Extremity Assessment: LLE deficits/detail LLE Deficits / Details: Decreased strength and ROM as expected post-operatively.    Cervical / Trunk Assessment Cervical / Trunk Assessment: Normal   Communication Communication Communication: No difficulties   Cognition Arousal/Alertness: Awake/alert Behavior During  Therapy: WFL for tasks assessed/performed Overall Cognitive Status: Within Functional Limits for tasks assessed                                     General Comments       Exercises Exercises: Total Joint Total Joint Exercises Quad Sets: 10 reps Long Arc Quad: 10 reps Knee Flexion: 10 reps Goniometric ROM: 95 flexion    Shoulder Instructions      Home Living Family/patient expects to be discharged to:: Private residence Living Arrangements: Parent Available Help at Discharge: Family Type of Home: House Home Access: Level entry     Home Layout: One level     Bathroom Shower/Tub: Walk-in shower;Tub/shower unit   Bathroom Toilet: Handicapped height     Home Equipment: Environmental consultant - 2 wheels;Bedside commode;Shower seat          Prior Functioning/Environment Level of Independence: Independent                 OT Problem List: Decreased strength;Decreased range of motion;Decreased activity tolerance;Impaired balance (sitting and/or standing);Decreased safety awareness;Decreased knowledge of use of DME or AE;Decreased knowledge of precautions;Pain      OT Treatment/Interventions:      OT Goals(Current goals can be found in the care plan section) Acute Rehab OT Goals Patient Stated Goal: return home tomorrow OT Goal Formulation: With patient Time For Goal Achievement: 11/28/17 Potential to Achieve Goals: Good  OT Frequency:     Barriers to D/C:            Co-evaluation              AM-PAC PT "6 Clicks" Daily Activity     Outcome Measure Help from another person eating meals?: None Help from another person taking care of personal grooming?: A Little Help from another person toileting, which includes using toliet, bedpan, or urinal?: A Little Help from another person bathing (including washing, rinsing, drying)?: A Little Help from another person to put on and taking off regular upper body clothing?: A Little Help from another person to put  on and taking off regular lower body clothing?: A Little 6 Click Score: 19   End of Session Equipment Utilized During Treatment: Rolling walker CPM Left Knee CPM Left Knee: Off  Activity Tolerance: Patient tolerated treatment well Patient left: in bed;with call bell/phone within reach  OT Visit Diagnosis: Other abnormalities of gait and mobility (R26.89);Pain Pain - Right/Left: Left Pain - part of body: Knee                Time: 7017-7939 OT Time Calculation (min): 15 min Charges:  OT General Charges $OT Visit: 1 Visit OT Evaluation $OT Eval Low Complexity: 1 Low G-Codes:     Darius Herrlich, MS OTR/L  Pager: [place] A Dannetta Lekas 11/14/2017, 5:53 PM

## 2017-11-14 NOTE — Progress Notes (Signed)
PATIENT ID: Darius Rogers        MRN:  [id number]          DOB/AGE: 1955-12-22 / 62 y.o.    Joni Fears, MD   Biagio Borg, PA-C [address] [place], [place]  [address]                             [phone]   PROGRESS NOTE  Subjective:  negative for Chest Pain  negative for Shortness of Breath  negative for Nausea/Vomiting   negative for Calf Pain    Tolerating Diet: yes         Patient reports pain as mild and moderate.     States very comfortable and better than last time in regards to pain  Objective: Vital signs in last 24 hours:    Patient Vitals for the past 24 hrs:  BP Temp Temp src Pulse Resp SpO2 Height Weight  11/14/17 0450 112/66 98.3 F (36.8 C) Oral 67 18 94 % - -  11/14/17 0230 (!) 104/59 97.9 F (36.6 C) Oral 65 18 98 % - -  11/13/17 2119 118/64 98.3 F (36.8 C) Oral 68 18 92 % - -  11/13/17 2000 - - - - - - 6' (1.829 m) 235 lb 14.3 oz (107 kg)  11/13/17 1650 128/77 98.1 F (36.7 C) Oral (!) 56 18 96 % - -  11/13/17 1615 - 98.1 F (36.7 C) - 63 17 95 % - -  11/13/17 1602 118/76 - - (!) 57 10 96 % - -  11/13/17 1600 - - - (!) 52 10 94 % - -  11/13/17 1547 115/79 - - (!) 52 10 94 % - -  11/13/17 1545 - - - (!) 54 10 96 % - -  11/13/17 1532 109/78 - - 65 15 93 % - -  11/13/17 1530 - - - (!) 55 11 95 % - -  11/13/17 1517 117/85 - - (!) 51 12 95 % - -  11/13/17 1515 - - - (!) 56 10 96 % - -  11/13/17 1502 125/82 - - (!) 53 11 95 % - -  11/13/17 1500 - - - (!) 58 12 92 % - -  11/13/17 1445 - 98.1 F (36.7 C) - (!) 57 16 95 % - -  11/13/17 1432 110/71 - - (!) 53 11 94 % - -  11/13/17 1430 - - - (!) 52 11 94 % - -  11/13/17 1417 104/75 - - (!) 55 10 96 % - -  11/13/17 1415 - - - (!) 55 11 95 % - -  11/13/17 1402 99/74 - - (!) 52 11 95 % - -  11/13/17 1400 - - - (!) 56 10 94 % - -  11/13/17 1347 103/67 - - 60 13 92 % - -  11/13/17 1345 - - - 60 10 93 % - -  11/13/17 1333 - (!) 97.4 F (36.3 C) - - - - - -  11/13/17 1332 105/69 - - (!) 57 11  96 % - -  11/13/17 0945 126/77 - - 61 14 97 % - -  11/13/17 0930 125/80 - - 61 11 96 % - -  11/13/17 0915 121/81 - - 61 (!) 9 96 % - -  11/13/17 0910 127/76 - - 63 13 96 % - -  11/13/17 0905 132/79 - - 65 12 95 % - -  11/13/17 0900 130/78 - - 65 12 95 % - -  11/13/17 0855 (!) 148/81 - - 68 11 94 % - -  11/13/17 0850 (!) 146/82 - - 69 12 95 % - -  11/13/17 0845 (!) 153/90 - - 74 14 96 % - -  11/13/17 0811 133/89 97.9 F (36.6 C) Oral 72 20 96 % - -      Intake/Output from previous day:   02/05 0701 - 02/06 0700 In: 2597.5 [P.O.:120; I.V.:2177.5] Out: 1350 [Urine:1300]   Intake/Output this shift:   No intake/output data recorded.   Intake/Output      02/05 0701 - 02/06 0700 02/06 0701 - 02/07 0700   P.O. 120    I.V. (mL/kg) 2177.5 (20.4)    IV Piggyback 300    Total Intake(mL/kg) 2597.5 (24.3)    Urine (mL/kg/hr) 1300    Blood 50    Total Output 1350    Net +1247.5            LABORATORY DATA: Recent Labs    11/14/17 0617  WBC 18.3*  HGB 13.0  HCT 40.1  PLT 260   Recent Labs    11/14/17 0617  NA 136  K 3.8  CL 104  CO2 19*  BUN 15  CREATININE 0.97  GLUCOSE 143*  CALCIUM 8.3*   Lab Results  Component Value Date   INR 0.92 11/01/2017   INR 1.03 04/26/2017    Recent Radiographic Studies :  No results found.   Examination:  General appearance: alert, mild distress and moderate distress Resp: clear to auscultation bilaterally Cardio: regular rate and rhythm GI: normal findings: bowel sounds normal  Wound Exam: clean, dry, intact   Drainage:  None: wound tissue dry  Motor Exam: EHL, FHL, Anterior Tibial and Posterior Tibial Intact  Sensory Exam: Superficial Peroneal, Deep Peroneal and Tibial normal  Vascular Exam: Right posterior tibial artery has 1+ (weak) pulse  Assessment:    1 Day Post-Op  Procedure(s) (LRB): LEFT TOTAL KNEE ARTHROPLASTY (Left)  ADDITIONAL DIAGNOSIS:  Principal Problem:   Primary osteoarthritis of left knee Active  Problems:   S/P total knee replacement using cement, left     Plan: Physical Therapy as ordered Partial Weight Bearing @ 50% (PWB)  DVT Prophylaxis:  Xarelto, Foot Pumps and TED hose  DISCHARGE PLAN: Home  DISCHARGE NEEDS: HHPT, CPM, Walker and 3-in-1 comode seat        Nonie Hoyer [place]  11/14/2017 7:36 AM  Patient ID: Darius Rogers, male   DOB: 1956/04/26, 62 y.o.   MRN: [id number]

## 2017-11-14 NOTE — Evaluation (Signed)
Physical Therapy Evaluation Patient Details Name: Darius Rogers MRN: [id number] DOB: 1955-12-03 Today's Date: 11/14/2017   History of Present Illness  62 y.o. male s/p L TKA. PMH includes: MI, CAD, HTN, DM2, R TKA, Forearm fx surgery.   Clinical Impression  Patient is s/p above surgery resulting in functional limitations due to the deficits listed below (see PT Problem List). PTA, patient living with parents in 1 story home with level entry. Pt ambulating without AD after rehabbing from contralateral TKA. Upon eval, patient presents with mild post op pain and weakness that limit his mobility. Currently mod I with mobility and supervision for OOB, ambulating hallways with great mechanics and adherence to PWB status. Next visit will ensure patient is safe to return home once medically cleared for d/c.  Patient will benefit from skilled PT to increase their independence and safety with mobility to allow discharge to the venue listed below.       Follow Up Recommendations Home health PT;Supervision for mobility/OOB;DC plan and follow up therapy as arranged by surgeon    Equipment Recommendations  None recommended by PT    Recommendations for Other Services       Precautions / Restrictions Precautions Precautions: Knee Precaution Booklet Issued: Yes (comment) Precaution Comments: reveiwed supine therex and no pillow under knee Restrictions Weight Bearing Restrictions: Yes RUE Weight Bearing: Weight bearing as tolerated LLE Weight Bearing: Partial weight bearing LLE Partial Weight Bearing Percentage or Pounds: 50%      Mobility  Bed Mobility Overal bed mobility: Modified Independent                Transfers Overall transfer level: Needs assistance Equipment used: Rolling walker (2 wheeled) Transfers: Sit to/from Stand Sit to Stand: Supervision         General transfer comment: Cues for hand placement and safety with RW during stand to sit. able to perfrom without physcial  assistance.   Ambulation/Gait Ambulation/Gait assistance: Supervision Ambulation Distance (Feet): 225 Feet Assistive device: Rolling walker (2 wheeled) Gait Pattern/deviations: Step-through pattern;Antalgic Gait velocity: slight decrease   General Gait Details: Cues for step length and heel strike, patient improved mechanics during session while adhering to PWB status. Supervision for safety at this time.   Stairs            Wheelchair Mobility    Modified Rankin (Stroke Patients Only)       Balance Overall balance assessment: Needs assistance Sitting-balance support: Feet unsupported;No upper extremity supported Sitting balance-Leahy Scale: Good     Standing balance support: During functional activity;Bilateral upper extremity supported Standing balance-Leahy Scale: Fair Standing balance comment: RW for dynamic mobility                              Pertinent Vitals/Pain Pain Assessment: Faces Faces Pain Scale: Hurts little more Pain Location: L knee Pain Descriptors / Indicators: Grimacing;Discomfort Pain Intervention(s): Limited activity within patient's tolerance;Premedicated before session;Monitored during session;Repositioned    Home Living Family/patient expects to be discharged to:: Private residence Living Arrangements: Parent Available Help at Discharge: Family Type of Home: House Home Access: Level entry     Home Layout: One level Home Equipment: Environmental consultant - 2 wheels;Bedside commode      Prior Function Level of Independence: Independent               Hand Dominance   Dominant Hand: Right    Extremity/Trunk Assessment   Upper Extremity Assessment Upper Extremity  Assessment: Defer to OT evaluation;Overall WFL for tasks assessed    Lower Extremity Assessment Lower Extremity Assessment: (Gross Strength :RLE  4/5, LLE  3/5 post op pain)    Cervical / Trunk Assessment Cervical / Trunk Assessment: Normal  Communication    Communication: No difficulties  Cognition Arousal/Alertness: Awake/alert Behavior During Therapy: WFL for tasks assessed/performed Overall Cognitive Status: Within Functional Limits for tasks assessed                                        General Comments      Exercises Total Joint Exercises Ankle Circles/Pumps: 20 reps Quad Sets: 10 reps Goniometric ROM: 90 flexion    Assessment/Plan    PT Assessment Patient needs continued PT services  PT Problem List Decreased strength;Decreased range of motion;Decreased activity tolerance;Decreased balance;Pain;Decreased mobility       PT Treatment Interventions DME instruction;Gait training;Functional mobility training;Stair training;Therapeutic activities;Therapeutic exercise    PT Goals (Current goals can be found in the Care Plan section)  Acute Rehab PT Goals Patient Stated Goal: return home tomorrow PT Goal Formulation: With patient Time For Goal Achievement: 11/21/17 Potential to Achieve Goals: Good    Frequency 7X/week   Barriers to discharge        Co-evaluation               AM-PAC PT "6 Clicks" Daily Activity  Outcome Measure Difficulty turning over in bed (including adjusting bedclothes, sheets and blankets)?: None Difficulty moving from lying on back to sitting on the side of the bed? : None Difficulty sitting down on and standing up from a chair with arms (e.g., wheelchair, bedside commode, etc,.)?: None Help needed moving to and from a bed to chair (including a wheelchair)?: A Little Help needed walking in hospital room?: A Little Help needed climbing 3-5 steps with a railing? : A Little 6 Click Score: 21    End of Session Equipment Utilized During Treatment: Gait belt Activity Tolerance: Patient tolerated treatment well Patient left: in chair;with call bell/phone within reach Nurse Communication: Mobility status PT Visit Diagnosis: Unsteadiness on feet (R26.81);Other abnormalities of  gait and mobility (R26.89);Pain Pain - Right/Left: Left Pain - part of body: Knee    Time: 1217-1239 PT Time Calculation (min) (ACUTE ONLY): 22 min   Charges:   PT Evaluation $PT Eval Low Complexity: 1 Low PT Treatments $Gait Training: 8-22 mins   PT G Codes:        Reinaldo Berber, PT, DPT Acute Rehab Services Pager: [phone]    Reinaldo Berber 11/14/2017, 12:59 PM

## 2017-11-14 NOTE — Progress Notes (Signed)
Physical Therapy Treatment and Discharge Patient Details Name: Darius Rogers MRN: [id number] DOB: 11-Jan-1956 Today's Date: 11/14/2017    History of Present Illness 62 y.o. male s/p L TKA. PMH includes: MI, CAD, HTN, DM2, R TKA, Forearm fx surgery.     PT Comments    Patient has progressed well with therapy, now ambulating without physical assistance long distances and good safety. Reviewed therex and dosage.   Pt educated on safety considerations for home and has no further questions or concerns at this time. Pt has met all functional goals and will benefit from skilled home health PT when medically cleared for d/c.     Follow Up Recommendations  Home health PT;Supervision for mobility/OOB;DC plan and follow up therapy as arranged by surgeon     Equipment Recommendations  None recommended by PT    Recommendations for Other Services       Precautions / Restrictions Precautions Precautions: Knee Precaution Booklet Issued: Yes (comment) Precaution Comments: reveiwed supine therex and no pillow under knee Restrictions Weight Bearing Restrictions: Yes RUE Weight Bearing: Weight bearing as tolerated LLE Weight Bearing: Partial weight bearing LLE Partial Weight Bearing Percentage or Pounds: 50%    Mobility  Bed Mobility Overal bed mobility: Modified Independent                Transfers Overall transfer level: Modified independent Equipment used: Rolling walker (2 wheeled)                Ambulation/Gait Ambulation/Gait assistance: Modified independent (Device/Increase time) Ambulation Distance (Feet): 300 Feet Assistive device: Rolling walker (2 wheeled) Gait Pattern/deviations: Step-through pattern;Antalgic Gait velocity: slight decrease   General Gait Details: Cues for heel strike, improved mechanics adhered to PWB status.    Stairs            Wheelchair Mobility    Modified Rankin (Stroke Patients Only)       Balance Overall balance  assessment: Needs assistance Sitting-balance support: Feet unsupported;No upper extremity supported Sitting balance-Leahy Scale: Good     Standing balance support: During functional activity;Bilateral upper extremity supported Standing balance-Leahy Scale: Fair Standing balance comment: RW for dynamic mobility                             Cognition Arousal/Alertness: Awake/alert Behavior During Therapy: WFL for tasks assessed/performed Overall Cognitive Status: Within Functional Limits for tasks assessed                                        Exercises Total Joint Exercises Quad Sets: 10 reps Long Arc Quad: 10 reps Knee Flexion: 10 reps Goniometric ROM: 95 flexion     General Comments        Pertinent Vitals/Pain Pain Assessment: Faces Faces Pain Scale: Hurts a little bit Pain Location: L knee Pain Descriptors / Indicators: Grimacing;Discomfort Pain Intervention(s): Limited activity within patient's tolerance;Monitored during session;Premedicated before session;Repositioned    Home Living                      Prior Function            PT Goals (current goals can now be found in the care plan section) Acute Rehab PT Goals Patient Stated Goal: return home tomorrow PT Goal Formulation: With patient Time For Goal Achievement: 11/21/17 Potential to Achieve Goals: Good Progress towards  PT goals: Goals met/education completed, patient discharged from PT    Frequency           PT Plan Current plan remains appropriate    Co-evaluation              AM-PAC PT "6 Clicks" Daily Activity  Outcome Measure  Difficulty turning over in bed (including adjusting bedclothes, sheets and blankets)?: None Difficulty moving from lying on back to sitting on the side of the bed? : None Difficulty sitting down on and standing up from a chair with arms (e.g., wheelchair, bedside commode, etc,.)?: None Help needed moving to and from a bed  to chair (including a wheelchair)?: None Help needed walking in hospital room?: A Little Help needed climbing 3-5 steps with a railing? : A Little 6 Click Score: 22    End of Session Equipment Utilized During Treatment: Gait belt Activity Tolerance: Patient tolerated treatment well Patient left: in chair;with call bell/phone within reach Nurse Communication: Mobility status PT Visit Diagnosis: Unsteadiness on feet (R26.81);Other abnormalities of gait and mobility (R26.89);Pain Pain - Right/Left: Left Pain - part of body: Knee     Time: 4034-7425 PT Time Calculation (min) (ACUTE ONLY): 18 min  Charges:  $Gait Training: 8-22 mins                    G Codes:       Reinaldo Berber, PT, DPT Acute Rehab Services Pager: [phone]     Reinaldo Berber 11/14/2017, 4:15 PM

## 2017-11-14 NOTE — Op Note (Signed)
NAME:  Darius Rogers, Darius Rogers                    ACCOUNT NO.:  MEDICAL RECORD NO.:  [id number]  LOCATION:                                 FACILITY:  PHYSICIAN:  Vonna Kotyk. Durward Fortes, M.D.    DATE OF BIRTH:  DATE OF PROCEDURE:  11/13/2017 DATE OF DISCHARGE:                              OPERATIVE REPORT   PREOPERATIVE DIAGNOSIS:  End-stage osteoarthritis, left knee.  POSTOPERATIVE DIAGNOSIS:  End-stage osteoarthritis, left knee.  PROCEDURE:  Left total knee replacement.  SURGEON:  Vonna Kotyk. Durward Fortes, M.D.  ASSISTANT:  Biagio Borg, PA-C.  ANESTHESIA:  Spinal with supplemental IV sedation.  COMPLICATIONS:  None.  COMPONENTS:  DePuy LCS standard plus femoral component, a #4 rotating keeled tibial tray, a 17.5 mm polyethylene bridging bearing, and metal- backed 3-peg rotating patella.  Components were secured with polymethyl methacrylate.  DESCRIPTION OF PROCEDURE:  Darius Rogers was met in the holding area, identified the left knee as appropriate operative site and marked it accordingly.  Anesthesia performed an interscalene nerve block.  The patient was then transported to room #7.  Anesthesia performed a spinal anesthetic without difficulty.  The patient was then placed supine in the operating table.  Tourniquet was applied to the left thigh.  The left lower extremity was then prepped with chlorhexidine scrub and DuraPrep x2 from the tourniquet to the tips of the toes.  Sterile draping was performed.  Extremity was then elevated and Esmarch exsanguinated after a time-out. Tourniquet elevated to 350 mmHg.  A midline longitudinal incision was made from this tibial tubercle through the superior pouch via sharp dissection, carried down to subcutaneous tissue.  First layer of capsule was incised in the midline. A medial parapatellar incision was then made with the Bovie.  The joint was entered.  There was minimal clear yellow joint effusion.  The patella was everted to 180 degrees  laterally, the knee flexed to 90 degrees.  Osteophytes were removed from the femoral condyle medially and laterally.  There was minimal cartilage remaining on the medial femoral condyle and medial tibial plateau.  Osteophytes were removed from the tibial plateau as well.  I measured a standard plus femoral component.  Retractors were then placed around the tibia.  First bony cut was then made transversely with the external guide in 7 degrees of declination. After each bony cut, I checked to be sure my alignment was appropriate, and if not I recut.  A standard plus femoral jig was then applied to the femur.  Anterior and posterior cuts were then made.  Lamina spreaders were inserted into the joint, and I removed medial and lateral menisci, ACL and PCL, and any osteophytes from the posterior femoral condyle.  I used a 4-degree distal femoral valgus cut.  The finishing guide was then applied to the tibia for oblique cuts and to obtain a center hole.  Retractor was then placed around the tibia, was advanced anteriorly and measured a #4 tibial tray.  This was pinned in place.  The center hole was then made followed by the keel cut.  Our flexion-extension gaps were symmetrical at 17.5 mm.  We had an aggressive cut on the proximal  tibia.  MCL and LCL remained intact throughout the procedure.  With the tibial jig in place, I applied the 17.5 mm polyethylene bridging bearing, followed by the trial standard plus femoral component. This entire construct was reduced and through a full range of motion was perfectly stable at full extension and flexion without malrotation of the tibial tray and no opening with varus or valgus stress.  The patella was then prepared by removing 11 mm of bone, leaving approximately 16 mm of patella thickness.  Three holes were then made. Trial patella inserted and reduced and through a full range of motion remained stable.  Trial components were removed.  The  joint was copiously irrigated with saline solution.  We then impacted the final components with polymethyl methacrylate.  Initially applied the #4 tibial tray followed by the trial polyethylene bridging bearing and then the standard plus femoral component.  The joint was reduced.  I removed any extraneous methacrylate.  Patella was applied with a patellar clamp and methacrylate.  At approximately 16 minutes, the methacrylate had matured.  During that time, we injected the joint with 0.25% Marcaine with epinephrine and copiously irrigated it as well.  We released the tourniquet.  Any bleeding was controlled with the Bovie.  The trial polyethylene bearing was removed.  Any bleeding behind the joint was then controlled with the Bovie.  The final 17.5 mm bridging bearing was then inserted and reduced without difficulty.  We applied tranexamic acid topically under compression for approximately 5 minutes.  We had a very minimal bleeding.  The joint was then closed in several layers.  The deep capsule was closed with a running #1 Ethibond, superficial capsule with a running 0 Vicryl, subcu in several layers with Vicryl and 3-0 Monocryl.  Skin closed with skin clips.  A sterile bulky dressing was applied followed by the patient support stocking.  The patient tolerated the procedure well without complications.     Vonna Kotyk. Durward Fortes, M.D.     PWW/MEDQ  D:  11/13/2017  T:  11/14/2017  Job:  [id number]

## 2017-11-14 NOTE — Progress Notes (Signed)
Pt well, back in bed after eating lunch in the chair.

## 2017-11-14 NOTE — Care Management Note (Addendum)
Case Management Note  Patient Details  Name: Collin Rengel MRN: [id number] Date of Birth: Mar 10, 1956  Subjective/Objective:                    Action/Plan:  Patient has walker and 3 in 1 already at home, needs CPM called Ruby Cola with [facility] [employer] for CPM  Ovid Curd from [facility] aware patient will be discharged tomorrow 11-15-17 am. Ovid Curd will call patient for delivery og CPM in am. Patient aware. Expected Discharge Date:                  Expected Discharge Plan:  [place]  In-House Referral:     Discharge planning Services  CM Consult  Post Acute Care Choice:  Durable Medical Equipment, Home Health Choice offered to:  Patient, Spouse  DME Arranged:  CPM DME Agency:     HH Arranged:  PT [place]:  [facility] (now [facility])  Status of Service:  Completed, signed off  If discussed at [place] of Stay Meetings, dates discussed:    Additional Comments:  Marilu Favre, RN 11/14/2017, 10:50 AM

## 2017-11-15 LAB — CBC
HCT: 36.9 % — ABNORMAL LOW (ref 39.0–52.0)
HEMOGLOBIN: 11.9 g/dL — AB (ref 13.0–17.0)
MCH: 29 pg (ref 26.0–34.0)
MCHC: 32.2 g/dL (ref 30.0–36.0)
MCV: 89.8 fL (ref 78.0–100.0)
PLATELETS: 275 10*3/uL (ref 150–400)
RBC: 4.11 MIL/uL — AB (ref 4.22–5.81)
RDW: 14.4 % (ref 11.5–15.5)
WBC: 13.5 10*3/uL — AB (ref 4.0–10.5)

## 2017-11-15 LAB — BASIC METABOLIC PANEL
ANION GAP: 13 (ref 5–15)
BUN: 11 mg/dL (ref 6–20)
CHLORIDE: 99 mmol/L — AB (ref 101–111)
CO2: 23 mmol/L (ref 22–32)
Calcium: 8.1 mg/dL — ABNORMAL LOW (ref 8.9–10.3)
Creatinine, Ser: 1.05 mg/dL (ref 0.61–1.24)
Glucose, Bld: 149 mg/dL — ABNORMAL HIGH (ref 65–99)
POTASSIUM: 3.7 mmol/L (ref 3.5–5.1)
SODIUM: 135 mmol/L (ref 135–145)

## 2017-11-15 LAB — GLUCOSE, CAPILLARY
GLUCOSE-CAPILLARY: 140 mg/dL — AB (ref 65–99)
Glucose-Capillary: 127 mg/dL — ABNORMAL HIGH (ref 65–99)

## 2017-11-15 MED ORDER — METHOCARBAMOL 500 MG PO TABS: 500.0000 mg | ORAL_TABLET | Freq: Three times a day (TID) | ORAL | 0 refills | Status: DC | PRN

## 2017-11-15 MED ORDER — ACETAMINOPHEN 325 MG PO TABS: 650.0000 mg | ORAL_TABLET | Freq: Four times a day (QID) | ORAL | Status: DC | PRN

## 2017-11-15 MED ORDER — RIVAROXABAN 10 MG PO TABS: 10.0000 mg | ORAL_TABLET | Freq: Every day | ORAL | 0 refills | Status: DC

## 2017-11-15 MED ORDER — OXYCODONE HCL 5 MG PO TABS: 5.0000 mg | ORAL_TABLET | ORAL | 0 refills | Status: DC | PRN

## 2017-11-15 NOTE — Progress Notes (Signed)
Subjective: 2 Days Post-Op Procedure(s) (LRB): LEFT TOTAL KNEE ARTHROPLASTY (Left) Patient reports pain as mild and moderate.    Objective: Vital signs in last 24 hours: Temp:  [98.2 F (36.8 C)-100.2 F (37.9 C)] 99.7 F (37.6 C) (02/07 0455) Pulse Rate:  [75-98] 98 (02/07 0829) Resp:  [16-18] 16 (02/07 0455) BP: (105-158)/(56-91) 158/91 (02/07 0829) SpO2:  [93 %-96 %] 93 % (02/07 0829)  Intake/Output from previous day: 02/06 0701 - 02/07 0700 In: 480 [P.O.:480] Out: 450 [Urine:450] Intake/Output this shift: Total I/O In: -  Out: 250 [Urine:250]  Recent Labs    11/14/17 0617 11/15/17 0624  HGB 13.0 11.9*   Recent Labs    11/14/17 0617 11/15/17 0624  WBC 18.3* 13.5*  RBC 4.48 4.11*  HCT 40.1 36.9*  PLT 260 275   Recent Labs    11/14/17 0617 11/15/17 0624  NA 136 135  K 3.8 3.7  CL 104 99*  CO2 19* 23  BUN 15 11  CREATININE 0.97 1.05  GLUCOSE 143* 149*  CALCIUM 8.3* 8.1*   No results for input(s): LABPT, INR in the last 72 hours.  Neurologically intact Sensation intact distally Intact pulses distally Dorsiflexion/Plantar flexion intact Incision: no drainage Compartments soft  Assessment/Plan: 2 Days Post-Op Procedure(s) (LRB): LEFT TOTAL KNEE ARTHROPLASTY (Left) Discharge home with home health  Biagio Borg PA-C 11/15/2017, 9:17 AM

## 2017-11-15 NOTE — Progress Notes (Signed)
Patient given discharge instructions. Patient verbalized understanding. Patient left unit with nursing staff and family via wheelchair in stable condition.

## 2017-11-15 NOTE — Discharge Summary (Signed)
Joni Fears, MD   Biagio Borg, PA-C [address], [place], [place]  [address]                             [phone]  PATIENT ID: Darius Rogers        MRN:  [id number]          DOB/AGE: [birth year] / 62 y.o.    DISCHARGE SUMMARY  ADMISSION DATE:    11/13/2017 DISCHARGE DATE:   11/15/2017   ADMISSION DIAGNOSIS: Osteoarthritis Left Knee    DISCHARGE DIAGNOSIS:  Osteoarthritis Left Knee    ADDITIONAL DIAGNOSIS: Principal Problem:   Primary osteoarthritis of left knee Active Problems:   S/P total knee replacement using cement, left  Past Medical History:  Diagnosis Date  . Arthritis    "knees" (11/13/2017)  . CAD (coronary artery disease)   . Constipation   . Heart attack (Darius Rogers) 2014  . Hyperlipemia   . Hypertension   . Type II diabetes mellitus (HCC)     PROCEDURE: Procedure(s): LEFT TOTAL KNEE ARTHROPLASTY  on 11/13/2017  CONSULTS: none    HISTORY: Darius Rogers, 62 y.o. male, has a history of pain and functional disability in the left knee due to arthritis and has failed non-surgical conservative treatments for greater than 12 weeks to includeNSAID's and/or analgesics, corticosteriod injections, viscosupplementation injections, flexibility and strengthening excercises, weight reduction as appropriate and activity modification.  Onset of symptoms was gradual, starting 4 years ago with gradually worsening course since that time. The patient noted no past surgery on the left knee(s).  Patient currently rates pain in the left knee(s) at 6 out of 10 with activity. Patient has worsening of pain with activity and weight bearing.  Patient has evidence of subchondral cysts, subchondral sclerosis, periarticular osteophytes, joint subluxation and joint space narrowing by imaging studies.There is no active infection.  HOSPITAL COURSE:  Ade Stmarie is a 62 y.o. admitted on 11/13/2017 and found to have a diagnosis of Osteoarthritis Left Knee.  After appropriate laboratory studies were  obtained  they were taken to the operating room on 11/13/2017 and underwent  Procedure(s): LEFT TOTAL KNEE ARTHROPLASTY  .   They were given perioperative antibiotics:  Anti-infectives (From admission, onward)   Start     Dose/Rate Route Frequency Ordered Stop   11/13/17 1700  ceFAZolin (ANCEF) IVPB 2g/100 mL premix     2 g 200 mL/hr over 30 Minutes Intravenous Every 6 hours 11/13/17 1643 11/13/17 2306   11/13/17 0755  ceFAZolin (ANCEF) IVPB 2g/100 mL premix     2 g 200 mL/hr over 30 Minutes Intravenous On call to O.R. 11/13/17 4403 11/13/17 1103    .  Tolerated the procedure well.    Toradol was given post op.  POD #1, allowed out of bed to a chair.  PT for ambulation and exercise program.  IV saline locked.  O2 discontionued.  POD #2, continued PT and ambulation.    The remainder of the hospital course was dedicated to ambulation and strengthening.   The patient was discharged on 2 Days Post-Op in  Stable condition.  Blood products given:none  DIAGNOSTIC STUDIES: Recent vital signs:  Patient Vitals for the past 24 hrs:  BP Temp Temp src Pulse Resp SpO2  11/15/17 0829 (!) 158/91 - - 98 - 93 %  11/15/17 0455 134/77 99.7 F (37.6 C) Oral 91 16 95 %  11/15/17 0207 134/65 100.2 F (37.9 C) Oral 85 16  94 %  11/14/17 2100 112/61 99.5 F (37.5 C) Oral 81 - 96 %  11/14/17 1508 (!) 107/56 - - - - -  11/14/17 1445 (!) 105/57 98.5 F (36.9 C) Oral 75 18 95 %  11/14/17 1143 - - - - - 95 %  11/14/17 1009 110/61 98.2 F (36.8 C) Oral 77 17 94 %       Recent laboratory studies: Recent Labs    11/14/17 0617 11/15/17 0624  WBC 18.3* 13.5*  HGB 13.0 11.9*  HCT 40.1 36.9*  PLT 260 275   Recent Labs    11/14/17 0617 11/15/17 0624  NA 136 135  K 3.8 3.7  CL 104 99*  CO2 19* 23  BUN 15 11  CREATININE 0.97 1.05  GLUCOSE 143* 149*  CALCIUM 8.3* 8.1*   Lab Results  Component Value Date   INR 0.92 11/01/2017   INR 1.03 04/26/2017     Recent Radiographic Studies :  No  results found.  DISCHARGE INSTRUCTIONS: Discharge Instructions    CPM   Complete by:  As directed    Continuous passive motion machine (CPM):      Use the CPM from 0 to 60 degrees for 6-8 hours per day.      You may increase by 5-10 per day.  You may break it up into 2 or 3 sessions per day.      Use CPM for 3-4 weeks or until you are told to stop.   Call MD / Call 911   Complete by:  As directed    If you experience chest pain or shortness of breath, CALL 911 and be transported to the hospital emergency room.  If you develope a fever above 101 F, pus (white drainage) or increased drainage or redness at the wound, or calf pain, call your surgeon's office.   Change dressing   Complete by:  As directed    DO NOT CHANGE YOUR DRESSING.   Constipation Prevention   Complete by:  As directed    Drink plenty of fluids.  Prune juice may be helpful.  You may use a stool softener, such as Colace (over the counter) 100 mg twice a day.  Use MiraLax (over the counter) for constipation as needed.   Diet Carb Modified   Complete by:  As directed    Discharge instructions   Complete by:  As directed    INSTRUCTIONS AFTER JOINT REPLACEMENT   Remove items at home which could result in a fall. This includes throw rugs or furniture in walking pathways ICE to the affected joint every three hours while awake for 30 minutes at a time, for at least the first 3-5 days, and then as needed for pain and swelling.  Continue to use ice for pain and swelling. You may notice swelling that will progress down to the foot and ankle.  This is normal after surgery.  Elevate your leg when you are not up walking on it.   Continue to use the breathing machine you got in the hospital (incentive spirometer) which will help keep your temperature down.  It is common for your temperature to cycle up and down following surgery, especially at night when you are not up moving around and exerting yourself.  The breathing machine keeps  your lungs expanded and your temperature down.   DIET:  As you were doing prior to hospitalization, we recommend a well-balanced diet.  DRESSING / WOUND CARE / SHOWERING  Keep the surgical  dressing until follow up.  The dressing is water proof, so you can shower without any extra covering.  IF THE DRESSING FALLS OFF or the wound gets wet inside, change the dressing with sterile gauze.  Please use good hand washing techniques before changing the dressing.  Do not use any lotions or creams on the incision until instructed by your surgeon.    ACTIVITY  Increase activity slowly as tolerated, but follow the weight bearing instructions below.   No driving for 6 weeks or until further direction given by your physician.  You cannot drive while taking narcotics.  No lifting or carrying greater than 10 lbs. until further directed by your surgeon. Avoid periods of inactivity such as sitting longer than an hour when not asleep. This helps prevent blood clots.  You may return to work once you are authorized by your doctor.     WEIGHT BEARING   Partial weight bearing with assist device as directed.  50%   EXERCISES  Results after joint replacement surgery are often greatly improved when you follow the exercise, range of motion and muscle strengthening exercises prescribed by your doctor. Safety measures are also important to protect the joint from further injury. Any time any of these exercises cause you to have increased pain or swelling, decrease what you are doing until you are comfortable again and then slowly increase them. If you have problems or questions, call your caregiver or physical therapist for advice.   Rehabilitation is important following a joint replacement. After just a few days of immobilization, the muscles of the leg can become weakened and shrink (atrophy).  These exercises are designed to build up the tone and strength of the thigh and leg muscles and to improve motion. Often  times heat used for twenty to thirty minutes before working out will loosen up your tissues and help with improving the range of motion but do not use heat for the first two weeks following surgery (sometimes heat can increase post-operative swelling).   These exercises can be done on a training (exercise) mat,  on a table or on a bed. Use whatever works the best and is most comfortable for you.    Use music or television while you are exercising so that the exercises are a pleasant break in your day. This will make your life better with the exercises acting as a break in your routine that you can look forward to.   Perform all exercises about fifteen times, three times per day or as directed.  You should exercise both the operative leg and the other leg as well.   Exercises include:  Quad Sets - Tighten up the muscle on the front of the thigh (Quad) and hold for 5-10 seconds.   Straight Leg Raises - With your knee straight (if you were given a brace, keep it on), lift the leg to 60 degrees, hold for 3 seconds, and slowly lower the leg.  Perform this exercise against resistance later as your leg gets stronger.  Leg Slides: Lying on your back, slowly slide your foot toward your buttocks, bending your knee up off the floor (only go as far as is comfortable). Then slowly slide your foot back down until your leg is flat on the floor again.  Angel Wings: Lying on your back spread your legs to the side as far apart as you can without causing discomfort.  Hamstring Strength:  Lying on your back, push your heel against the floor with your  leg straight by tightening up the muscles of your buttocks.  Repeat, but this time bend your knee to a comfortable angle, and push your heel against the floor.  You may put a pillow under the heel to make it more comfortable if necessary.   A rehabilitation program following joint replacement surgery can speed recovery and prevent re-injury in the future due to weakened  muscles. Contact your doctor or a physical therapist for more information on knee rehabilitation.    CONSTIPATION  Constipation is defined medically as fewer than three stools per week and severe constipation as less than one stool per week.  Even if you have a regular bowel pattern at home, your normal regimen is likely to be disrupted due to multiple reasons following surgery.  Combination of anesthesia, postoperative narcotics, change in appetite and fluid intake all can affect your bowels.   YOU MUST use at least one of the following options; they are listed in order of increasing strength to get the job done.  They are all available over the counter, and you may need to use some, POSSIBLY even all of these options:    Drink plenty of fluids (prune juice may be helpful) and high fiber foods Colace 100 mg by mouth twice a day  Senokot for constipation as directed and as needed Dulcolax (bisacodyl), take with full glass of water  Miralax (polyethylene glycol) once or twice a day as needed.  If you have tried all these things and are unable to have a bowel movement in the first 3-4 days after surgery call either your surgeon or your primary doctor.    If you experience loose stools or diarrhea, hold the medications until you stool forms back up.  If your symptoms do not get better within 1 week or if they get worse, check with your doctor.  If you experience "the worst abdominal pain ever" or develop nausea or vomiting, please contact the office immediately for further recommendations for treatment.   ITCHING:  If you experience itching with your medications, try taking only a single pain pill, or even half a pain pill at a time.  You can also use Benadryl over the counter for itching or also to help with sleep.   TED HOSE STOCKINGS:  Use stockings on both legs until for at least 2 weeks or as directed by physician office. They may be removed at night for sleeping.  MEDICATIONS:  See your  medication summary on the "After Visit Summary" that nursing will review with you.  You may have some home medications which will be placed on hold until you complete the course of blood thinner medication.  It is important for you to complete the blood thinner medication as prescribed.  PRECAUTIONS:  If you experience chest pain or shortness of breath - call 911 immediately for transfer to the hospital emergency department.   If you develop a fever greater that 101 F, purulent drainage from wound, increased redness or drainage from wound, foul odor from the wound/dressing, or calf pain - CONTACT YOUR SURGEON.                                                   FOLLOW-UP APPOINTMENTS:  If you do not already have a post-op appointment, please call the office for an appointment to be seen by  your surgeon.  Guidelines for how soon to be seen are listed in your "After Visit Summary", but are typically between 1-4 weeks after surgery.  OTHER INSTRUCTIONS:   Knee Replacement:  Do not place pillow under knee, focus on keeping the knee straight while resting. CPM instructions: 0-90 degrees, 2 hours in the morning, 2 hours in the afternoon, and 2 hours in the evening. Place foam block, curve side up under heel at all times except when in CPM or when walking.  DO NOT modify, tear, cut, or change the foam block in any way.  MAKE SURE YOU:  Understand these instructions.  Get help right away if you are not doing well or get worse.    Thank you for letting us be a part of your medical care team.  It is a privilege we respect greatly.  We hope these instructions will help you stay on track for a fast and full recovery!   Do not put a pillow under the knee. Place it under the heel.   Complete by:  As directed    Driving restrictions   Complete by:  As directed    No driving for 6 weeks   Increase activity slowly as tolerated   Complete by:  As directed    Lifting restrictions   Complete by:  As directed     No lifting for 6 weeks   Partial weight bearing   Complete by:  As directed    % Body Weight:  50%   Laterality:  left   Extremity:  Lower   Patient may shower   Complete by:  As directed    You may shower over your dressing   TED hose   Complete by:  As directed    Use stockings (TED hose) for 3 weeks on left  leg.  You may remove them at night for sleeping.      DISCHARGE MEDICATIONS:   Allergies as of 11/15/2017      Reactions   Other Hives   Chili cheese fritos      Medication List    STOP taking these medications   aspirin EC 81 MG tablet   naproxen sodium 220 MG tablet Commonly known as:  ALEVE   NON FORMULARY   oxyCODONE-acetaminophen 5-325 MG tablet Commonly known as:  ROXICET     TAKE these medications   acetaminophen 325 MG tablet Commonly known as:  TYLENOL Take 2 tablets (650 mg total) by mouth every 6 (six) hours as needed for mild pain or moderate pain.   atorvastatin 40 MG tablet Commonly known as:  LIPITOR TAKE 1 TABLET BY MOUTH ONCE DAILY What changed:    how much to take  how to take this  when to take this   carvedilol 12.5 MG tablet Commonly known as:  COREG TAKE ONE TABLET BY MOUTH TWICE DAILY   Fiber Powd Take 1 Scoop by mouth at bedtime.   lisinopril 20 MG tablet Commonly known as:  PRINIVIL,ZESTRIL TAKE ONE TABLET BY MOUTH ONCE DAILY What changed:    how much to take  how to take this  when to take this   metFORMIN 500 MG tablet Commonly known as:  GLUCOPHAGE Take 1 tablet (500 mg total) by mouth 2 (two) times daily.   methocarbamol 500 MG tablet Commonly known as:  ROBAXIN Take 1 tablet (500 mg total) by mouth every 8 (eight) hours as needed for muscle spasms.   oxyCODONE 5 MG immediate release tablet  Commonly known as:  Oxy IR/ROXICODONE Take 1-2 tablets (5-10 mg total) by mouth every 4 (four) hours as needed for moderate pain ((score 4 to 6)). What changed:  reasons to take this   rivaroxaban 10 MG Tabs  tablet Commonly known as:  XARELTO Take 1 tablet (10 mg total) by mouth daily with breakfast. Start taking on:  11/16/2017   SENNA LAXATIVE 25 MG Tabs Generic drug:  Sennosides Take 25 mg by mouth at bedtime.   tamsulosin 0.4 MG Caps capsule Commonly known as:  FLOMAX Take 0.4 mg by mouth at bedtime.            Durable Medical Equipment  (From admission, onward)        Start     Ordered   11/13/17 1644  DME Walker rolling  Once    Question:  Patient needs a walker to treat with the following condition  Answer:  S/P total knee replacement using cement, right   11/13/17 1643   11/13/17 1644  DME 3 n 1  Once     11/13/17 1643   11/13/17 1644  DME Bedside commode  Once    Question:  Patient needs a bedside commode to treat with the following condition  Answer:  S/P total knee replacement using cement, right   11/13/17 1643       Discharge Care Instructions  (From admission, onward)        Start     Ordered   11/15/17 0000  Partial weight bearing    Question Answer Comment  % Body Weight 50%   Laterality left   Extremity Lower      11/15/17 0933   11/15/17 0000  Change dressing    Comments:  DO NOT CHANGE YOUR DRESSING.   11/15/17 0933      FOLLOW UP VISIT:   Follow-up Information    [facility] Follow up.   Specialty:  Home Health Services Why:  Home health Contact information: [place] [address] [phone]        Garald Balding, MD Follow up on 11/28/2017.   Specialty:  Orthopedic Surgery Contact information: [address] [place] [address] [phone]           DISPOSITION:   Home  CONDITION:  Stable   Mike Craze. [place], [place] [phone]  11/15/2017 9:35 AM

## 2017-11-19 ENCOUNTER — Telehealth (INDEPENDENT_AMBULATORY_CARE_PROVIDER_SITE_OTHER): Payer: Self-pay | Admitting: Orthopaedic Surgery

## 2017-11-19 NOTE — Telephone Encounter (Signed)
Verdis Frederickson, physical therapist with [facility] called to request verbal orders for in-home P.T.  1 time week for 1 week and then twice a week for 2 weeks.  If you have any questions, please call  [phone]

## 2017-11-19 NOTE — Telephone Encounter (Signed)
Called per PW

## 2017-11-23 ENCOUNTER — Other Ambulatory Visit (INDEPENDENT_AMBULATORY_CARE_PROVIDER_SITE_OTHER): Payer: Self-pay | Admitting: *Deleted

## 2017-11-23 ENCOUNTER — Telehealth (INDEPENDENT_AMBULATORY_CARE_PROVIDER_SITE_OTHER): Payer: Self-pay | Admitting: Orthopaedic Surgery

## 2017-11-23 MED ORDER — TRAMADOL HCL 50 MG PO TABS: ORAL_TABLET | ORAL | 0 refills | Status: DC

## 2017-11-23 NOTE — Telephone Encounter (Signed)
Called [place] and he said no one here to sign for meds. Wait until Monday for them.  PW said he can have Tramadol 50 mg  #30 OEV:OJJK 1-2 tablets q6 prn  If doesn't want Dr. Durward Fortes will handle it Monday.

## 2017-11-23 NOTE — Telephone Encounter (Signed)
Patient left a message requesting rx for pain medication. Per patient, he will be out of pills tomorrow. Please call to advise.

## 2017-11-23 NOTE — Telephone Encounter (Signed)
I called patient, [facility], Tramadol RX phoned in

## 2017-11-26 ENCOUNTER — Inpatient Hospital Stay (INDEPENDENT_AMBULATORY_CARE_PROVIDER_SITE_OTHER): Payer: Medicaid Other | Admitting: Orthopaedic Surgery

## 2017-11-28 ENCOUNTER — Encounter (INDEPENDENT_AMBULATORY_CARE_PROVIDER_SITE_OTHER): Payer: Self-pay | Admitting: Orthopedic Surgery

## 2017-11-28 ENCOUNTER — Ambulatory Visit (INDEPENDENT_AMBULATORY_CARE_PROVIDER_SITE_OTHER): Payer: Medicaid Other | Admitting: Orthopedic Surgery

## 2017-11-28 VITALS — Resp 12 | Ht 70.0 in | Wt 235.0 lb

## 2017-11-28 DIAGNOSIS — Z96652 Presence of left artificial knee joint: Secondary | ICD-10-CM

## 2017-11-28 NOTE — Progress Notes (Signed)
Office Visit Note   Patient: Darius Rogers           Date of Birth: January 25, 1956           MRN: [id number] Visit Date: 11/28/2017              Requested by: Monico Blitz, MD [address] [place], [place] [address] PCP: Monico Blitz, MD   Assessment & Plan: Visit Diagnoses:  1. Total knee replacement status, left     Plan:  #1: Staples removed and Steri-Strips are placed. #2: Continue home physical therapy and wean to outpatient therapy. #3: Prescription for Norco was given for pain   Follow-Up Instructions: Return in about 3 weeks (around 12/19/2017).   Orders:  No orders of the defined types were placed in this encounter.  No orders of the defined types were placed in this encounter.     Procedures: No procedures performed   Clinical Data: No additional findings.   Subjective: Chief Complaint  Patient presents with  . Left Knee - Routine Post Op    Darius Rogers is a 62 y o S/P 2 weeks Left TKA. He relates the L knee doesn't hurt but the surrounding muscles are very tight. Pt wants med stronger than Tramadol for PT    HPI  Review of Systems  Constitutional: Negative for fatigue.  HENT: Negative for hearing loss.   Respiratory: Negative for apnea, chest tightness and shortness of breath.   Cardiovascular: Negative for chest pain, palpitations and leg swelling.  Gastrointestinal: Negative for blood in stool, constipation and diarrhea.  Genitourinary: Negative for difficulty urinating.  Musculoskeletal: Positive for joint swelling. Negative for arthralgias, back pain, myalgias, neck pain and neck stiffness.  Neurological: Positive for weakness. Negative for numbness and headaches.  Hematological: Does not bruise/bleed easily.  Psychiatric/Behavioral: Positive for sleep disturbance. The patient is not nervous/anxious.      Objective: Vital Signs: Resp 12   Ht 5\' 10"  (1.778 m)   Wt 235 lb (106.6 kg)   BMI 33.72 kg/m   Physical Exam  Ortho Exam  Exam today  reveals range of motion 3 degrees to about 90 degrees.  A little laxity with valgus stressing.  Neurovascular intact.  Calf is supple nontender.  Certainly has some ecchymosis in the thigh and does have some thigh pain associated.  Specialty Comments:  No specialty comments available.  Imaging: No results found.  2 view x-ray of the left knee reveals excellent position alignment of the prosthesis.   PMFS History: Patient Active Problem List   Diagnosis Date Noted  . Primary osteoarthritis of left knee 11/13/2017  . S/P total knee replacement using cement, left 11/13/2017  . Bilateral primary osteoarthritis of knee 09/26/2017  . Primary osteoarthritis of right knee 05/08/2017  . S/P TKR (total knee replacement) using cement, right 05/08/2017   Past Medical History:  Diagnosis Date  . Arthritis    "knees" (11/13/2017)  . CAD (coronary artery disease)   . Constipation   . Heart attack ([place]) 2014  . Hyperlipemia   . Hypertension   . Type II diabetes mellitus (HCC)     Family History  Problem Relation Age of Onset  . Hypertension Mother   . Diabetes Mother   . Post-traumatic stress disorder Father   . Sleep apnea Father   . Other Maternal Grandmother        PPM  . Heart attack Maternal Grandfather     Past Surgical History:  Procedure Laterality Date  .  COLONOSCOPY    . CORONARY ANGIOPLASTY WITH STENT PLACEMENT  2014  . FOREARM FRACTURE SURGERY Right 1975  . FRACTURE SURGERY Right 1975   R arm   . JOINT REPLACEMENT    . TOTAL KNEE ARTHROPLASTY Right 05/08/2017   Procedure: RIGHT TOTAL KNEE ARTHROPLASTY;  Surgeon: Garald Balding, MD;  Location: [facility];  Service: Orthopedics;  Laterality: Right;  . TOTAL KNEE ARTHROPLASTY Left 11/13/2017  . TOTAL KNEE ARTHROPLASTY Left 11/13/2017   Procedure: LEFT TOTAL KNEE ARTHROPLASTY;  Surgeon: Garald Balding, MD;  Location: [facility];  Service: Orthopedics;  Laterality: Left;   Social History   Occupational History  . Not on file    Tobacco Use  . Smoking status: Former Smoker    Packs/day: 0.50    Years: 11.00    Pack years: 5.50    Types: Cigarettes    Start date: 10/09/2004    Last attempt to quit: 05/09/2013    Years since quitting: 4.5  . Smokeless tobacco: Never Used  Substance and Sexual Activity  . Alcohol use: No    Alcohol/week: 0.0 oz  . Drug use: No  . Sexual activity: Not Currently

## 2017-12-03 ENCOUNTER — Telehealth (INDEPENDENT_AMBULATORY_CARE_PROVIDER_SITE_OTHER): Payer: Self-pay | Admitting: Orthopaedic Surgery

## 2017-12-03 ENCOUNTER — Other Ambulatory Visit (INDEPENDENT_AMBULATORY_CARE_PROVIDER_SITE_OTHER): Payer: Self-pay | Admitting: Orthopedic Surgery

## 2017-12-03 MED ORDER — METHOCARBAMOL 500 MG PO TABS: 500.0000 mg | ORAL_TABLET | Freq: Three times a day (TID) | ORAL | 0 refills | Status: DC | PRN

## 2017-12-03 MED ORDER — HYDROCODONE-ACETAMINOPHEN 5-325 MG PO TABS: 1.0000 | ORAL_TABLET | Freq: Four times a day (QID) | ORAL | 0 refills | Status: DC | PRN

## 2017-12-03 NOTE — Telephone Encounter (Signed)
Do you want me to send the robaxin? Wait until Wednesday for Hydrocodone?

## 2017-12-03 NOTE — Telephone Encounter (Signed)
Called pt and relayed Brian's message. I told him to call pharmacy and make sure they received the RX

## 2017-12-03 NOTE — Telephone Encounter (Signed)
Call him..Scheduled Meds: Continuous Infusions: PRN Meds:. Sent to paharmacy

## 2017-12-03 NOTE — Telephone Encounter (Signed)
Patient called requesting prescription refill of Methocarbamol and Hydrocodone.  Patient states that he is completely out of both medications.  Patient uses [facility] in [place].

## 2017-12-19 ENCOUNTER — Inpatient Hospital Stay (INDEPENDENT_AMBULATORY_CARE_PROVIDER_SITE_OTHER): Payer: Medicaid Other | Admitting: Orthopaedic Surgery

## 2017-12-25 ENCOUNTER — Ambulatory Visit: Payer: Medicaid Other | Admitting: Urology

## 2017-12-25 DIAGNOSIS — R351 Nocturia: Secondary | ICD-10-CM

## 2017-12-25 DIAGNOSIS — N401 Enlarged prostate with lower urinary tract symptoms: Secondary | ICD-10-CM

## 2017-12-25 DIAGNOSIS — N5201 Erectile dysfunction due to arterial insufficiency: Secondary | ICD-10-CM

## 2018-01-14 ENCOUNTER — Ambulatory Visit (INDEPENDENT_AMBULATORY_CARE_PROVIDER_SITE_OTHER): Payer: Medicaid Other | Admitting: Otolaryngology

## 2018-01-14 DIAGNOSIS — D37030 Neoplasm of uncertain behavior of the parotid salivary glands: Secondary | ICD-10-CM

## 2018-01-31 ENCOUNTER — Other Ambulatory Visit: Payer: Self-pay | Admitting: Otolaryngology

## 2018-02-01 ENCOUNTER — Ambulatory Visit: Payer: Medicaid Other | Admitting: Urology

## 2018-02-01 DIAGNOSIS — R351 Nocturia: Secondary | ICD-10-CM | POA: Diagnosis not present

## 2018-02-01 DIAGNOSIS — N5201 Erectile dysfunction due to arterial insufficiency: Secondary | ICD-10-CM

## 2018-02-01 DIAGNOSIS — N401 Enlarged prostate with lower urinary tract symptoms: Secondary | ICD-10-CM | POA: Diagnosis not present

## 2018-02-08 ENCOUNTER — Other Ambulatory Visit: Payer: Self-pay | Admitting: Urology

## 2018-02-12 ENCOUNTER — Encounter (HOSPITAL_BASED_OUTPATIENT_CLINIC_OR_DEPARTMENT_OTHER): Payer: Self-pay | Admitting: *Deleted

## 2018-02-12 ENCOUNTER — Other Ambulatory Visit: Payer: Self-pay

## 2018-02-14 ENCOUNTER — Encounter (HOSPITAL_BASED_OUTPATIENT_CLINIC_OR_DEPARTMENT_OTHER)
Admission: RE | Admit: 2018-02-14 | Discharge: 2018-02-14 | Disposition: A | Discharge status: Home / Self Care | Payer: Medicaid Other | Source: Ambulatory Visit | Attending: Otolaryngology | Admitting: Otolaryngology

## 2018-02-14 DIAGNOSIS — Z01818 Encounter for other preprocedural examination: Secondary | ICD-10-CM | POA: Insufficient documentation

## 2018-02-14 LAB — BASIC METABOLIC PANEL
Anion gap: 12 (ref 5–15)
BUN: 10 mg/dL (ref 6–20)
CO2: 23 mmol/L (ref 22–32)
Calcium: 9.2 mg/dL (ref 8.9–10.3)
Chloride: 102 mmol/L (ref 101–111)
Creatinine, Ser: 1.04 mg/dL (ref 0.61–1.24)
GFR calc Af Amer: >60 mL/min (ref >60)
GLUCOSE: 149 mg/dL — AB (ref 65–99)
Potassium: 3.9 mmol/L (ref 3.5–5.1)
Sodium: 137 mmol/L (ref 135–145)

## 2018-02-18 NOTE — Anesthesia Preprocedure Evaluation (Addendum)
Anesthesia Evaluation  Patient identified by MRN, date of birth, ID band Patient awake    Reviewed: Allergy & Precautions, NPO status , Patient's Chart, lab work & pertinent test results  Airway Mallampati: III  TM Distance: >3 FB Neck ROM: Full    Dental no notable dental hx. (+) Teeth Intact, Dental Advisory Given   Pulmonary former smoker,    Pulmonary exam normal breath sounds clear to auscultation       Cardiovascular Exercise Tolerance: Good hypertension, Pt. on home beta blockers + CAD and + Past MI  Normal cardiovascular exam Rhythm:Regular Rate:Normal     Neuro/Psych negative neurological ROS  negative psych ROS   GI/Hepatic negative GI ROS, Neg liver ROS,   Endo/Other  diabetes, Type 2  Renal/GU negative Renal ROS  negative genitourinary   Musculoskeletal negative musculoskeletal ROS (+)   Abdominal (+) + obese,   Peds  Hematology negative hematology ROS (+)   Anesthesia Other Findings ALL Bactrim  Reproductive/Obstetrics negative OB ROS                             Anesthesia Physical Anesthesia Plan  ASA: III  Anesthesia Plan: General   Post-op Pain Management:    Induction: Intravenous  PONV Risk Score and Plan:   Airway Management Planned: Oral ETT  Additional Equipment:   Intra-op Plan:   Post-operative Plan: Extubation in OR  Informed Consent: I have reviewed the patients History and Physical, chart, labs and discussed the procedure including the risks, benefits and alternatives for the proposed anesthesia with the patient or authorized representative who has indicated his/her understanding and acceptance.   Dental advisory given  Plan Discussed with:   Anesthesia Plan Comments:         Anesthesia Quick Evaluation

## 2018-02-19 ENCOUNTER — Ambulatory Visit (HOSPITAL_BASED_OUTPATIENT_CLINIC_OR_DEPARTMENT_OTHER)
Admission: RE | Admit: 2018-02-19 | Discharge: 2018-02-20 | Disposition: A | Discharge status: Home / Self Care | Payer: Medicaid Other | Source: Ambulatory Visit | Attending: Otolaryngology | Admitting: Otolaryngology

## 2018-02-19 ENCOUNTER — Encounter (HOSPITAL_BASED_OUTPATIENT_CLINIC_OR_DEPARTMENT_OTHER): Payer: Self-pay | Admitting: *Deleted

## 2018-02-19 ENCOUNTER — Ambulatory Visit (HOSPITAL_BASED_OUTPATIENT_CLINIC_OR_DEPARTMENT_OTHER): Payer: Medicaid Other | Admitting: Anesthesiology

## 2018-02-19 ENCOUNTER — Encounter (HOSPITAL_BASED_OUTPATIENT_CLINIC_OR_DEPARTMENT_OTHER)
Admission: RE | Disposition: A | Discharge status: Home / Self Care | Payer: Self-pay | Source: Ambulatory Visit | Attending: Otolaryngology

## 2018-02-19 ENCOUNTER — Other Ambulatory Visit: Payer: Self-pay

## 2018-02-19 DIAGNOSIS — K118 Other diseases of salivary glands: Secondary | ICD-10-CM | POA: Diagnosis present

## 2018-02-19 DIAGNOSIS — C07 Malignant neoplasm of parotid gland: Secondary | ICD-10-CM | POA: Insufficient documentation

## 2018-02-19 DIAGNOSIS — E119 Type 2 diabetes mellitus without complications: Secondary | ICD-10-CM | POA: Diagnosis not present

## 2018-02-19 DIAGNOSIS — I2510 Atherosclerotic heart disease of native coronary artery without angina pectoris: Secondary | ICD-10-CM | POA: Insufficient documentation

## 2018-02-19 DIAGNOSIS — I252 Old myocardial infarction: Secondary | ICD-10-CM | POA: Diagnosis not present

## 2018-02-19 DIAGNOSIS — Z7984 Long term (current) use of oral hypoglycemic drugs: Secondary | ICD-10-CM | POA: Insufficient documentation

## 2018-02-19 DIAGNOSIS — Z9049 Acquired absence of other specified parts of digestive tract: Secondary | ICD-10-CM

## 2018-02-19 DIAGNOSIS — Z79899 Other long term (current) drug therapy: Secondary | ICD-10-CM | POA: Diagnosis not present

## 2018-02-19 DIAGNOSIS — Z87891 Personal history of nicotine dependence: Secondary | ICD-10-CM | POA: Diagnosis not present

## 2018-02-19 DIAGNOSIS — I10 Essential (primary) hypertension: Secondary | ICD-10-CM | POA: Insufficient documentation

## 2018-02-19 DIAGNOSIS — D37030 Neoplasm of uncertain behavior of the parotid salivary glands: Secondary | ICD-10-CM | POA: Diagnosis not present

## 2018-02-19 HISTORY — PX: PAROTIDECTOMY: SHX2163

## 2018-02-19 LAB — GLUCOSE, CAPILLARY
Glucose-Capillary: 136 mg/dL — ABNORMAL HIGH (ref 65–99)
Glucose-Capillary: 144 mg/dL — ABNORMAL HIGH (ref 65–99)
Glucose-Capillary: 155 mg/dL — ABNORMAL HIGH (ref 65–99)

## 2018-02-19 SURGERY — EXCISION, PAROTID GLAND
Anesthesia: General | Site: Neck | Laterality: Right

## 2018-02-19 MED ORDER — LACTATED RINGERS IV SOLN
INTRAVENOUS | Status: DC
  Administered 2018-02-19 (×2): via INTRAVENOUS

## 2018-02-19 MED ORDER — FENTANYL CITRATE (PF) 100 MCG/2ML IJ SOLN
INTRAMUSCULAR | Status: AC
  Filled 2018-02-19: qty 2

## 2018-02-19 MED ORDER — ALFUZOSIN HCL ER 10 MG PO TB24: 10.0000 mg | ORAL_TABLET | Freq: Every day | ORAL | Status: DC

## 2018-02-19 MED ORDER — PHENYLEPHRINE 40 MCG/ML (10ML) SYRINGE FOR IV PUSH (FOR BLOOD PRESSURE SUPPORT)
PREFILLED_SYRINGE | INTRAVENOUS | Status: DC | PRN
  Administered 2018-02-19 (×2): 40 ug via INTRAVENOUS
  Administered 2018-02-19: 80 ug via INTRAVENOUS

## 2018-02-19 MED ORDER — DEXAMETHASONE SODIUM PHOSPHATE 10 MG/ML IJ SOLN
INTRAMUSCULAR | Status: AC
  Filled 2018-02-19: qty 1

## 2018-02-19 MED ORDER — SCOPOLAMINE 1 MG/3DAYS TD PT72: 1.0000 | MEDICATED_PATCH | Freq: Once | TRANSDERMAL | Status: DC | PRN

## 2018-02-19 MED ORDER — MEPERIDINE HCL 25 MG/ML IJ SOLN: 6.2500 mg | INTRAMUSCULAR | Status: DC | PRN

## 2018-02-19 MED ORDER — ONDANSETRON HCL 4 MG/2ML IJ SOLN
INTRAMUSCULAR | Status: AC
  Filled 2018-02-19: qty 2

## 2018-02-19 MED ORDER — METFORMIN HCL 500 MG PO TABS
500.0000 mg | ORAL_TABLET | Freq: Two times a day (BID) | ORAL | Status: DC
  Administered 2018-02-19 (×2): 500 mg via ORAL

## 2018-02-19 MED ORDER — HYDROMORPHONE HCL 1 MG/ML IJ SOLN: 0.2500 mg | INTRAMUSCULAR | Status: DC | PRN

## 2018-02-19 MED ORDER — EPHEDRINE SULFATE 50 MG/ML IJ SOLN
INTRAMUSCULAR | Status: DC | PRN
  Administered 2018-02-19 (×7): 10 mg via INTRAVENOUS

## 2018-02-19 MED ORDER — ONDANSETRON HCL 4 MG PO TABS: 4.0000 mg | ORAL_TABLET | ORAL | Status: DC | PRN

## 2018-02-19 MED ORDER — OXYCODONE-ACETAMINOPHEN 5-325 MG PO TABS
1.0000 | ORAL_TABLET | ORAL | Status: DC | PRN
  Administered 2018-02-19 – 2018-02-20 (×2): 2 via ORAL
  Filled 2018-02-19 (×2): qty 2

## 2018-02-19 MED ORDER — SUCCINYLCHOLINE CHLORIDE 20 MG/ML IJ SOLN
INTRAMUSCULAR | Status: DC | PRN
  Administered 2018-02-19: 120 mg via INTRAVENOUS

## 2018-02-19 MED ORDER — PROMETHAZINE HCL 25 MG/ML IJ SOLN: 6.2500 mg | INTRAMUSCULAR | Status: DC | PRN

## 2018-02-19 MED ORDER — PHENYLEPHRINE 40 MCG/ML (10ML) SYRINGE FOR IV PUSH (FOR BLOOD PRESSURE SUPPORT)
PREFILLED_SYRINGE | INTRAVENOUS | Status: AC
  Filled 2018-02-19: qty 10

## 2018-02-19 MED ORDER — PROPOFOL 10 MG/ML IV BOLUS
INTRAVENOUS | Status: DC | PRN
  Administered 2018-02-19: 150 mg via INTRAVENOUS

## 2018-02-19 MED ORDER — ONDANSETRON HCL 4 MG/2ML IJ SOLN: 4.0000 mg | INTRAMUSCULAR | Status: DC | PRN

## 2018-02-19 MED ORDER — HYDROCODONE-ACETAMINOPHEN 7.5-325 MG PO TABS: 1.0000 | ORAL_TABLET | Freq: Once | ORAL | Status: DC | PRN

## 2018-02-19 MED ORDER — LIDOCAINE HCL (CARDIAC) PF 100 MG/5ML IV SOSY
PREFILLED_SYRINGE | INTRAVENOUS | Status: DC | PRN
  Administered 2018-02-19: 100 mg via INTRAVENOUS

## 2018-02-19 MED ORDER — LIDOCAINE-EPINEPHRINE 1 %-1:100000 IJ SOLN
INTRAMUSCULAR | Status: DC | PRN
  Administered 2018-02-19: 4 mL

## 2018-02-19 MED ORDER — TAMSULOSIN HCL 0.4 MG PO CAPS
0.4000 mg | ORAL_CAPSULE | Freq: Every day | ORAL | Status: DC
  Administered 2018-02-19: 0.4 mg via ORAL

## 2018-02-19 MED ORDER — MORPHINE SULFATE (PF) 2 MG/ML IV SOLN: 2.0000 mg | INTRAVENOUS | Status: DC | PRN

## 2018-02-19 MED ORDER — ONDANSETRON HCL 4 MG/2ML IJ SOLN
INTRAMUSCULAR | Status: DC | PRN
  Administered 2018-02-19: 4 mg via INTRAVENOUS

## 2018-02-19 MED ORDER — CEFAZOLIN SODIUM-DEXTROSE 2-4 GM/100ML-% IV SOLN
INTRAVENOUS | Status: AC
  Filled 2018-02-19: qty 100

## 2018-02-19 MED ORDER — PROPOFOL 10 MG/ML IV BOLUS
INTRAVENOUS | Status: AC
  Filled 2018-02-19: qty 20

## 2018-02-19 MED ORDER — CEFAZOLIN SODIUM-DEXTROSE 2-3 GM-%(50ML) IV SOLR
INTRAVENOUS | Status: DC | PRN
  Administered 2018-02-19: 2 g via INTRAVENOUS

## 2018-02-19 MED ORDER — MIDAZOLAM HCL 2 MG/2ML IJ SOLN
1.0000 mg | INTRAMUSCULAR | Status: DC | PRN
  Administered 2018-02-19: 2 mg via INTRAVENOUS

## 2018-02-19 MED ORDER — MIDAZOLAM HCL 2 MG/2ML IJ SOLN
INTRAMUSCULAR | Status: AC
  Filled 2018-02-19: qty 2

## 2018-02-19 MED ORDER — POTASSIUM CHLORIDE IN NACL 20-0.9 MEQ/L-% IV SOLN
INTRAVENOUS | Status: DC
  Administered 2018-02-19: 15:00:00 via INTRAVENOUS
  Filled 2018-02-19: qty 1000

## 2018-02-19 MED ORDER — CARVEDILOL 12.5 MG PO TABS
12.5000 mg | ORAL_TABLET | Freq: Two times a day (BID) | ORAL | Status: DC
  Administered 2018-02-19: 12.5 mg via ORAL

## 2018-02-19 MED ORDER — DEXAMETHASONE SODIUM PHOSPHATE 4 MG/ML IJ SOLN
INTRAMUSCULAR | Status: DC | PRN
  Administered 2018-02-19: 10 mg via INTRAVENOUS

## 2018-02-19 MED ORDER — ACETAMINOPHEN 10 MG/ML IV SOLN: 1000.0000 mg | Freq: Once | INTRAVENOUS | Status: DC | PRN

## 2018-02-19 MED ORDER — LISINOPRIL 20 MG PO TABS: 20.0000 mg | ORAL_TABLET | Freq: Every day | ORAL | Status: DC

## 2018-02-19 MED ORDER — ATORVASTATIN CALCIUM 40 MG PO TABS: 40.0000 mg | ORAL_TABLET | Freq: Every day | ORAL | Status: DC

## 2018-02-19 MED ORDER — FENTANYL CITRATE (PF) 100 MCG/2ML IJ SOLN
50.0000 ug | INTRAMUSCULAR | Status: AC | PRN
  Administered 2018-02-19 (×3): 50 ug via INTRAVENOUS
  Administered 2018-02-19: 100 ug via INTRAVENOUS

## 2018-02-19 SURGICAL SUPPLY — 62 items
APPLICATOR DR MATTHEWS STRL (MISCELLANEOUS) ×3 IMPLANT
ATTRACTOMAT 16X20 MAGNETIC DRP (DRAPES) ×3 IMPLANT
BLADE SURG 15 STRL LF DISP TIS (BLADE) ×1 IMPLANT
BLADE SURG 15 STRL SS (BLADE) ×2
CANISTER SUCT 1200ML W/VALVE (MISCELLANEOUS) ×3 IMPLANT
CORD BIPOLAR FORCEPS 12FT (ELECTRODE) ×3 IMPLANT
COTTONBALL LRG STERILE PKG (GAUZE/BANDAGES/DRESSINGS) ×3 IMPLANT
COVER BACK TABLE 60X90IN (DRAPES) ×3 IMPLANT
COVER MAYO STAND STRL (DRAPES) ×3 IMPLANT
DECANTER SPIKE VIAL GLASS SM (MISCELLANEOUS) IMPLANT
DERMABOND ADVANCED (GAUZE/BANDAGES/DRESSINGS) ×2
DERMABOND ADVANCED .7 DNX12 (GAUZE/BANDAGES/DRESSINGS) ×1 IMPLANT
DRAIN CHANNEL 10F 3/8 F FF (DRAIN) ×3 IMPLANT
DRAPE SURG 17X23 STRL (DRAPES) ×3 IMPLANT
DRAPE U-SHAPE 76X120 STRL (DRAPES) ×3 IMPLANT
ELECT COATED BLADE 2.86 ST (ELECTRODE) ×3 IMPLANT
ELECT PAIRED SUBDERMAL (MISCELLANEOUS) ×3
ELECT REM PT RETURN 9FT ADLT (ELECTROSURGICAL) ×3
ELECTRODE PAIRED SUBDERMAL (MISCELLANEOUS) ×1 IMPLANT
ELECTRODE REM PT RTRN 9FT ADLT (ELECTROSURGICAL) ×1 IMPLANT
EVACUATOR SILICONE 100CC (DRAIN) ×3 IMPLANT
FORCEPS BIPOLAR SPETZLER 8 1.0 (NEUROSURGERY SUPPLIES) ×3 IMPLANT
GAUZE SPONGE 4X4 16PLY XRAY LF (GAUZE/BANDAGES/DRESSINGS) ×3 IMPLANT
GLOVE BIO SURGEON STRL SZ 6.5 (GLOVE) ×4 IMPLANT
GLOVE BIO SURGEON STRL SZ7 (GLOVE) ×6 IMPLANT
GLOVE BIO SURGEON STRL SZ7.5 (GLOVE) ×3 IMPLANT
GLOVE BIO SURGEONS STRL SZ 6.5 (GLOVE) ×2
GLOVE BIOGEL PI IND STRL 7.0 (GLOVE) ×2 IMPLANT
GLOVE BIOGEL PI INDICATOR 7.0 (GLOVE) ×4
GLOVE EXAM NITRILE MD LF STRL (GLOVE) ×3 IMPLANT
GOWN STRL REUS W/ TWL LRG LVL3 (GOWN DISPOSABLE) ×4 IMPLANT
GOWN STRL REUS W/ TWL XL LVL3 (GOWN DISPOSABLE) ×1 IMPLANT
GOWN STRL REUS W/TWL LRG LVL3 (GOWN DISPOSABLE) ×8
GOWN STRL REUS W/TWL XL LVL3 (GOWN DISPOSABLE) ×2
LOCATOR NERVE 3 VOLT (DISPOSABLE) IMPLANT
NEEDLE HYPO 25X1 1.5 SAFETY (NEEDLE) ×3 IMPLANT
NS IRRIG 1000ML POUR BTL (IV SOLUTION) ×3 IMPLANT
PACK BASIN DAY SURGERY FS (CUSTOM PROCEDURE TRAY) ×3 IMPLANT
PAD ALCOHOL SWAB (MISCELLANEOUS) ×6 IMPLANT
PENCIL BUTTON HOLSTER BLD 10FT (ELECTRODE) ×3 IMPLANT
PIN SAFETY STERILE (MISCELLANEOUS) IMPLANT
PROBE NERVBE PRASS .33 (MISCELLANEOUS) ×3 IMPLANT
SHEARS HARMONIC 9CM CVD (BLADE) ×3 IMPLANT
SLEEVE SCD COMPRESS KNEE MED (MISCELLANEOUS) ×3 IMPLANT
SPONGE INTESTINAL PEANUT (DISPOSABLE) ×9 IMPLANT
STAPLER VISISTAT 35W (STAPLE) IMPLANT
SUT ETHILON 3 0 PS 1 (SUTURE) ×3 IMPLANT
SUT PROLENE 5 0 P 3 (SUTURE) ×3 IMPLANT
SUT SILK 2 0 PERMA HAND 18 BK (SUTURE) ×9 IMPLANT
SUT SILK 2 0 TIES 17X18 (SUTURE)
SUT SILK 2-0 18XBRD TIE BLK (SUTURE) IMPLANT
SUT SILK 3 0 TIES 17X18 (SUTURE) ×2
SUT SILK 3-0 18XBRD TIE BLK (SUTURE) ×1 IMPLANT
SUT SILK 4 0 TIES 17X18 (SUTURE) IMPLANT
SUT VIC AB 3-0 FS2 27 (SUTURE) IMPLANT
SUT VICRYL 4-0 PS2 18IN ABS (SUTURE) ×3 IMPLANT
SYR BULB 3OZ (MISCELLANEOUS) ×3 IMPLANT
SYR CONTROL 10ML LL (SYRINGE) ×3 IMPLANT
TOWEL OR 17X24 6PK STRL BLUE (TOWEL DISPOSABLE) ×3 IMPLANT
TRAY DSU PREP LF (CUSTOM PROCEDURE TRAY) ×3 IMPLANT
TUBE CONNECTING 20'X1/4 (TUBING) ×1
TUBE CONNECTING 20X1/4 (TUBING) ×2 IMPLANT

## 2018-02-19 NOTE — Op Note (Signed)
DATE OF PROCEDURE:  02/19/2018                              OPERATIVE REPORT  SURGEON:  [name], MD  ASSISTANT: Sullivan Lone, RNFA  PREOPERATIVE DIAGNOSES: 1. Right parotid mass  POSTOPERATIVE DIAGNOSES: 1. Right parotid mass  PROCEDURE PERFORMED: Right lateral parotidectomy with facial nerve dissection  ANESTHESIA:  General endotracheal tube anesthesia.  COMPLICATIONS:  None.  ESTIMATED BLOOD LOSS: 100 ml  INDICATION FOR PROCEDURE:  Darius Rogers is a 62 y.o. male with a history of a 1.9 cm right parotid mass.  The patient would like to have the mass surgically removed.  Based on the above findings, the decision was made for the patient to undergo the above-stated procedure.  The risks, benefits, alternatives, and details of the procedure were discussed with the patient.  Questions were invited and answered.  Informed consent was obtained.  DESCRIPTION:  The patient was taken to the operating room and placed supine on the operating table.  General endotracheal tube anesthesia was administered by the anesthesiologist.  The patient was positioned and prepped and draped in a standard fashion for right parotidectomy surgery.    1% lidocaine with 1-100,000 epinephrine was infiltrated at the planned site of incision.  A lazy S preauricular facelift incision was made. The SMAS skin flap was elevated in a standard fashion.  A 2 cm soft tissue mass was noted within the parotid gland, anterior to the right auditory canal.  Careful dissection was carried out in the preauricular plane, down to the level of the facial nerve.  The main trunk of the facial nerve was identified.  The main trunk and branches of the facial nerve were carefully dissected free from the surrounding parotid tissue.  Once the branches of the facial nerve were identified, the parotid mass was carefully dissected free from the deep parotid lobe.  The parotid mass was sent to the pathology department as a fresh specimen for  histologic identification.  The surgical site was copiously irrigated.  A #10 JP drain was placed.  The incision was closed in layers with 4-0 Vicryl and Dermabond.  The care of the patient was turned over to the anesthesiologist.  The patient was awakened from anesthesia without difficulty.  The patient was extubated and transferred to the recovery room in good condition.  OPERATIVE FINDINGS:  A 2 cm right parotid mass.   SPECIMEN: Right parotid mass.  FOLLOWUP CARE:  The patient will be observed overnight.  Merilee Wible Cassie Freer 02/19/2018 12:52 PM

## 2018-02-19 NOTE — Transfer of Care (Signed)
Immediate Anesthesia Transfer of Care Note  Patient: Darius Rogers  Procedure(s) Performed: RIGHT PAROTIDECTOMY (Right Neck)  Patient Location: PACU  Anesthesia Type:General  Level of Consciousness: sedated  Airway & Oxygen Therapy: Patient Spontanous Breathing and Patient connected to face mask oxygen  Post-op Assessment: Report given to RN and Post -op Vital signs reviewed and stable  Post vital signs: Reviewed and stable  Last Vitals:  Vitals Value Taken Time  BP 132/83 02/19/2018  1:01 PM  Temp    Pulse 85 02/19/2018  1:02 PM  Resp 15 02/19/2018  1:02 PM  SpO2 94 % 02/19/2018  1:02 PM  Vitals shown include unvalidated device data.  Last Pain:  Vitals:   02/19/18 0805  TempSrc: Oral  PainSc: 0-No pain         Complications: No apparent anesthesia complications

## 2018-02-19 NOTE — Anesthesia Postprocedure Evaluation (Signed)
Anesthesia Post Note  Patient: Darius Rogers  Procedure(s) Performed: RIGHT PAROTIDECTOMY (Right Neck)     Patient location during evaluation: PACU Anesthesia Type: General Level of consciousness: awake and alert Pain management: pain level controlled Vital Signs Assessment: post-procedure vital signs reviewed and stable Respiratory status: spontaneous breathing, nonlabored ventilation, respiratory function stable and patient connected to nasal cannula oxygen Cardiovascular status: blood pressure returned to baseline and stable Postop Assessment: no apparent nausea or vomiting Anesthetic complications: no    Last Vitals:  Vitals:   02/19/18 1415 02/19/18 1430  BP: 130/79 122/74  Pulse: 75   Resp: 14 16  Temp:  36.6 C  SpO2: 94% 96%    Last Pain:  Vitals:   02/19/18 1430  TempSrc:   PainSc: 0-No pain                 Damieon Armendariz

## 2018-02-19 NOTE — H&P (Signed)
Cc: Right parotid mass  HPI: The patient is a 62 y/o male who presents today for evaluation of a right facial mass. The patient is seen in consultation requested by [facility]. The patient first noticed his right facial mass 2 months ago. It has not changed in size and is not painful. No dysphagia or odynophagia is noted. The patient underwent a neck CT which showed a 1.9 cm right parotid mass. The patient has a remote history of tobacco use. No previous ENT surgery is noted.   The patient's review of systems (constitutional, eyes, ENT, cardiovascular, respiratory, GI, musculoskeletal, skin, neurologic, psychiatric, endocrine, hematologic, allergic) is noted in the ROS questionnaire.  It is reviewed with the patient.   Family health history: Diabetes.  Major events: Knee surgery, heart attack.  Ongoing medical problems: Hypertension, arthritis, diabetes.  Social history: The patient is single.  He denies the use of tobacco, alcohol, or illegal drugs.  Exam General: Communicates without difficulty, well nourished, no acute distress. Head: Normocephalic, no evidence injury, no tenderness, facial buttresses intact without stepoff. Eyes: PERRL, EOMI. No scleral icterus, conjunctivae clear. Neuro: CN II exam reveals vision grossly intact.  No nystagmus at any point of gaze. Ears: Auricles well formed without lesions.  Ear canals are intact without mass or lesion.  No erythema or edema is appreciated.  The TMs are intact without fluid. Nose: External evaluation reveals normal support and skin without lesions.  Dorsum is intact.  Anterior rhinoscopy reveals healthy pink mucosa over anterior aspect of inferior turbinates and intact septum.  No purulence noted. Oral:  Oral cavity and oropharynx are intact, symmetric, without erythema or edema.  Mucosa is moist without lesions. Neck: Full range of motion without pain.  There is no significant lymphadenopathy.  No masses palpable.  Thyroid bed within  normal limits to palpation.  Submandibular glands equal bilaterally without mass.  Trachea is midline. A 1.9 cm right parotid mass is noted. Neuro:  CN 2-12 grossly intact. Gait normal. Vestibular: No nystagmus at any point of gaze.   Assessment The patient is noted to have a 1.9 cm right parotid mass.   Plan  1. The physical exam and CT findings are reviewed with the patient. 2. Treatment options include observation vs FNA biopsy vs parotidectomy. The risks, benefits, alternatives, and details of the treatment options are reviewed with the patient. Questions are invited and answered. 3. The patient would like to proceed with surgical excision. The surgery will be scheduled in accordance with the patient's schedule.

## 2018-02-19 NOTE — Anesthesia Procedure Notes (Signed)
Procedure Name: Intubation Date/Time: 02/19/2018 10:17 AM Performed by: Maryella Shivers, CRNA Pre-anesthesia Checklist: Patient identified, Emergency Drugs available, Suction available and Patient being monitored Patient Re-evaluated:Patient Re-evaluated prior to induction Oxygen Delivery Method: Circle system utilized Preoxygenation: Pre-oxygenation with 100% oxygen Induction Type: IV induction Ventilation: Mask ventilation without difficulty Laryngoscope Size: Mac and 4 Grade View: Grade I Tube type: Oral Tube size: 8.0 mm Number of attempts: 1 Airway Equipment and Method: Stylet and Oral airway Placement Confirmation: ETT inserted through vocal cords under direct vision,  positive ETCO2 and breath sounds checked- equal and bilateral Secured at: 22 cm Tube secured with: Tape Dental Injury: Teeth and Oropharynx as per pre-operative assessment

## 2018-02-20 ENCOUNTER — Encounter (HOSPITAL_BASED_OUTPATIENT_CLINIC_OR_DEPARTMENT_OTHER): Payer: Self-pay | Admitting: Otolaryngology

## 2018-02-20 DIAGNOSIS — C07 Malignant neoplasm of parotid gland: Secondary | ICD-10-CM | POA: Diagnosis not present

## 2018-02-20 MED ORDER — AMOXICILLIN 875 MG PO TABS: 875.0000 mg | ORAL_TABLET | Freq: Two times a day (BID) | ORAL | 0 refills | Status: AC

## 2018-02-20 MED ORDER — OXYCODONE-ACETAMINOPHEN 5-325 MG PO TABS: 1.0000 | ORAL_TABLET | ORAL | 0 refills | Status: DC | PRN

## 2018-02-20 NOTE — Discharge Instructions (Signed)
Parotidectomy, Care After Refer to this sheet in the next few weeks. These instructions provide you with information about caring for yourself after your procedure. Your health care provider may also give you more specific instructions. Your treatment has been planned according to current medical practices, but problems sometimes occur. Call your health care provider if you have any problems or questions after your procedure. What can I expect after the procedure? After your procedure, it is common to have:  Pain and mild swelling at the site of your incision.  Numbness along the incision.  Numbness in part or all of your ear.  Mild jaw discomfort on the surgical side when you are eating or chewing.  Follow these instructions at home: Medicines  Take over-the-counter and prescription medicines only as told by your health care provider.  If you were prescribed an antibiotic medicine, take it as told by your health care provider. Do not stop taking the antibiotic even if you start to feel better. Incision care  Follow instructions from your health care provider about how to take care of your incision. Make sure you: ? Leave stitches (sutures), skin glue, or adhesive strips in place. These skin closures may need to be in place for 2 weeks or longer. If adhesive strip edges start to loosen and curl up, you may trim the loose edges. Do not remove adhesive strips completely unless your health care provider tells you to do that.  Check your incision area every day for signs of infection. Check for: ? More redness, swelling, or pain. ? More fluid or blood. ? Warmth ? Pus or a bad smell.  Follow your health care providers instructions about cleaning and maintaining the drain that was placed near your incision. Eating and drinking  Follow instructions from your health care provider about eating or drinking restrictions.  If your mouth or jaw is sore, try eating soft foods until you feel  better.  Drink enough fluid to keep your urine clear or pale yellow. General instructions  Return to your normal activities as told by your health care provider. Ask your health care provider what activities are safe for you.  Keep your head raised (elevated) when you lie down. Try using two pillows to do this. Do this until your incision heals.  Do not take baths, swim, or use a hot tub until your health care provider approves. Ask your health care provider if you can take showers.  Keep all follow-up visits as told by your health care provider. This is important. Contact a health care provider if:  You have pain that does not get better with medicine.  You have more redness, swelling, or pain around your incision.  You have more fluid or blood coming from your incision.  Your incision feels warm to the touch.  You have pus or a bad smell coming from your incision.  You vomit or feel nauseous.  You have a fever.  Your face sweats or turns red when you eat. Get help right away if:  You have pain, swelling, or redness that suddenly gets worse at the incision site.  You have increasing numbness or weakness in your face.  You have severe pain. This information is not intended to replace advice given to you by your health care provider. Make sure you discuss any questions you have with your health care provider. Document Released: 10/28/2010 Document Revised: 05/25/2016 Document Reviewed: 08/31/2015 Elsevier Interactive Patient Education  [employer].

## 2018-02-20 NOTE — Discharge Summary (Signed)
Physician Discharge Summary  Patient ID: Darius Rogers MRN: [id number] DOB/AGE: Dec 18, 1955 62 y.o.  Admit date: 02/19/2018 Discharge date: 02/20/2018  Admission Diagnoses: Right parotid mass  Discharge Diagnoses: Right parotid mass Active Problems:   H/O parotidectomy   Discharged Condition: good  Hospital Course: Pt had an uneventful overnight stay. Pt tolerated po well. No bleeding. No stridor. Facial nerve function intact bilaterally.  Consults: None  Significant Diagnostic Studies: None  Treatments: surgery: Right lateral parotidectomy  Discharge Exam: Blood pressure (!) 107/57, pulse 78, temperature 97.7 F (36.5 C), resp. rate 16, height 6' (1.829 m), weight 106.6 kg (235 lb), SpO2 93 %. Incision c/d/i. CN 7 intact.  Disposition: Discharge disposition: 01-Home or Self Care       Discharge Instructions    Activity as tolerated - No restrictions   Complete by:  As directed    Diet general   Complete by:  As directed      Allergies as of 02/20/2018      Reactions   Bactrim [sulfamethoxazole-trimethoprim] Hives   Other Hives   Chili cheese fritos      Medication List    STOP taking these medications   aspirin 81 MG chewable tablet     TAKE these medications   acetaminophen 325 MG tablet Commonly known as:  TYLENOL Take 2 tablets (650 mg total) by mouth every 6 (six) hours as needed for mild pain or moderate pain.   alfuzosin 10 MG 24 hr tablet Commonly known as:  UROXATRAL Take 10 mg by mouth daily with breakfast.   amoxicillin 875 MG tablet Commonly known as:  AMOXIL Take 1 tablet (875 mg total) by mouth 2 (two) times daily for 5 days.   atorvastatin 40 MG tablet Commonly known as:  LIPITOR TAKE 1 TABLET BY MOUTH ONCE DAILY What changed:    how much to take  how to take this  when to take this   carvedilol 12.5 MG tablet Commonly known as:  COREG TAKE ONE TABLET BY MOUTH TWICE DAILY   Fiber Powd Take 1 Scoop by mouth at bedtime.    lisinopril 20 MG tablet Commonly known as:  PRINIVIL,ZESTRIL TAKE ONE TABLET BY MOUTH ONCE DAILY What changed:    how much to take  how to take this  when to take this   metFORMIN 500 MG tablet Commonly known as:  GLUCOPHAGE Take 1 tablet (500 mg total) by mouth 2 (two) times daily.   oxyCODONE-acetaminophen 5-325 MG tablet Commonly known as:  PERCOCET/ROXICET Take 1-2 tablets by mouth every 4 (four) hours as needed for severe pain.   tamsulosin 0.4 MG Caps capsule Commonly known as:  FLOMAX Take 0.4 mg by mouth at bedtime.      Follow-up Information    Darius Baptist, MD Follow up Rogers 02/25/2018.   Specialty:  Otolaryngology Why:  as scheduled Contact information: [address] [place] [address] [phone]           Signed: Burley Saver 02/20/2018, 8:41 AM

## 2018-02-25 ENCOUNTER — Ambulatory Visit (INDEPENDENT_AMBULATORY_CARE_PROVIDER_SITE_OTHER): Payer: Medicaid Other | Admitting: Otolaryngology

## 2018-03-05 NOTE — Patient Instructions (Signed)
Darius Rogers  03/05/2018     @PREFPERIOPPHARMACY @   Your procedure is scheduled on 03/11/2018.  Report to [facility] at 8:30 A.M.  Call this number if you have problems the morning of surgery:  [phone]   Remember:  Nothing to eat or drink after midnight      Take these medicines the morning of surgery with A SIP OF WATER Uroxitrol, Coreg, Lisinopril, Claritin, Oxycodone, Flomax    Do not wear jewelry, make-up or nail polish.  Do not wear lotions, powders, or perfumes, or deodorant.  Do not shave 48 hours prior to surgery.  Men may shave face and neck.  Do not bring valuables to the hospital.  [facility] is not responsible for any belongings or valuables.  Contacts, dentures or bridgework may not be worn into surgery.  Leave your suitcase in the car.  After surgery it may be brought to your room.  For patients admitted to the hospital, discharge time will be determined by your treatment team.  Patients discharged the day of surgery will not be allowed to drive home.    Please read over the following fact sheets that you were given. Surgical Site Infection Prevention and Anesthesia Post-op Instructions     PATIENT INSTRUCTIONS POST-ANESTHESIA  IMMEDIATELY FOLLOWING SURGERY:  Do not drive or operate machinery for the first twenty four hours after surgery.  Do not make any important decisions for twenty four hours after surgery or while taking narcotic pain medications or sedatives.  If you develop intractable nausea and vomiting or a severe headache please notify your doctor immediately.  FOLLOW-UP:  Please make an appointment with your surgeon as instructed. You do not need to follow up with anesthesia unless specifically instructed to do so.  WOUND CARE INSTRUCTIONS (if applicable):  Keep a dry clean dressing on the anesthesia/puncture wound site if there is drainage.  Once the wound has quit draining you may leave it open to air.  Generally you should leave the  bandage intact for twenty four hours unless there is drainage.  If the epidural site drains for more than 36-48 hours please call the anesthesia department.  QUESTIONS?:  Please feel free to call your physician or the hospital operator if you have any questions, and they will be happy to assist you.      Cystoscopy Cystoscopy is a procedure that is used to help diagnose and sometimes treat conditions that affect that lower urinary tract. The lower urinary tract includes the bladder and the tube that drains urine from the bladder out of the body (urethra). Cystoscopy is performed with a thin, tube-shaped instrument with a light and camera at the end (cystoscope). The cystoscope may be hard (rigid) or flexible, depending on the goal of the procedure.The cystoscope is inserted through the urethra, into the bladder. Cystoscopy may be recommended if you have:  Urinary tractinfections that keep coming back (recurring).  Blood in the urine (hematuria).  Loss of bladder control (urinary incontinence) or an overactive bladder.  Unusual cells found in a urine sample.  A blockage in the urethra.  Painful urination.  An abnormality in the bladder found during an intravenous pyelogram (IVP) or CT scan.  Cystoscopy may also be done to remove a sample of tissue to be examined under a microscope (biopsy). Tell a health care provider about:  Any allergies you have.  All medicines you are taking, including vitamins, herbs, eye drops, creams, and over-the-counter medicines.  Any problems you or family  members have had with anesthetic medicines.  Any blood disorders you have.  Any surgeries you have had.  Any medical conditions you have.  Whether you are pregnant or may be pregnant. What are the risks? Generally, this is a safe procedure. However, problems may occur, including:  Infection.  Bleeding.  Allergic reactions to medicines.  Damage to other structures or organs.  What happens  before the procedure?  Ask your health care provider about: ? Changing or stopping your regular medicines. This is especially important if you are taking diabetes medicines or blood thinners. ? Taking medicines such as aspirin and ibuprofen. These medicines can thin your blood. Do not take these medicines before your procedure if your health care provider instructs you not to.  Follow instructions from your health care provider about eating or drinking restrictions.  You may be given antibiotic medicine to help prevent infection.  You may have an exam or testing, such as X-rays of the bladder, urethra, or kidneys.  You may have urine tests to check for signs of infection.  Plan to have someone take you home after the procedure. What happens during the procedure?  To reduce your risk of infection,your health care team will wash or sanitize their hands.  You will be given one or more of the following: ? A medicine to help you relax (sedative). ? A medicine to numb the area (local anesthetic).  The area around the opening of your urethra will be cleaned.  The cystoscope will be passed through your urethra into your bladder.  Germ-free (sterile)fluid will flow through the cystoscope to fill your bladder. The fluid will stretch your bladder so that your surgeon can clearly examine your bladder walls.  The cystoscope will be removed and your bladder will be emptied. The procedure may vary among health care providers and hospitals. What happens after the procedure?  You may have some soreness or pain in your abdomen and urethra. Medicines will be available to help you.  You may have some blood in your urine.  Do not drive for 24 hours if you received a sedative. This information is not intended to replace advice given to you by your health care provider. Make sure you discuss any questions you have with your health care provider. Document Released: 09/22/2000 Document Revised:  02/03/2016 Document Reviewed: 08/12/2015 Elsevier Interactive Patient Education  [employer].

## 2018-03-06 ENCOUNTER — Encounter (HOSPITAL_COMMUNITY): Payer: Self-pay

## 2018-03-06 ENCOUNTER — Encounter (HOSPITAL_COMMUNITY)
Admission: RE | Admit: 2018-03-06 | Discharge: 2018-03-06 | Disposition: A | Discharge status: Home / Self Care | Payer: Medicaid Other | Source: Ambulatory Visit | Attending: Urology | Admitting: Urology

## 2018-03-06 DIAGNOSIS — Z01812 Encounter for preprocedural laboratory examination: Secondary | ICD-10-CM | POA: Insufficient documentation

## 2018-03-06 DIAGNOSIS — N400 Enlarged prostate without lower urinary tract symptoms: Secondary | ICD-10-CM | POA: Insufficient documentation

## 2018-03-06 LAB — CBC
HCT: 44.8 % (ref 39.0–52.0)
HEMOGLOBIN: 14.1 g/dL (ref 13.0–17.0)
MCH: 27.8 pg (ref 26.0–34.0)
MCHC: 31.5 g/dL (ref 30.0–36.0)
MCV: 88.2 fL (ref 78.0–100.0)
PLATELETS: 321 10*3/uL (ref 150–400)
RBC: 5.08 MIL/uL (ref 4.22–5.81)
RDW: 14.3 % (ref 11.5–15.5)
WBC: 10.4 10*3/uL (ref 4.0–10.5)

## 2018-03-06 LAB — GLUCOSE, CAPILLARY: Glucose-Capillary: 159 mg/dL — ABNORMAL HIGH (ref 65–99)

## 2018-03-06 LAB — BASIC METABOLIC PANEL
Anion gap: 12 (ref 5–15)
BUN: 15 mg/dL (ref 6–20)
CALCIUM: 9.2 mg/dL (ref 8.9–10.3)
CO2: 25 mmol/L (ref 22–32)
CREATININE: 1 mg/dL (ref 0.61–1.24)
Chloride: 101 mmol/L (ref 101–111)
Glucose, Bld: 151 mg/dL — ABNORMAL HIGH (ref 65–99)
Potassium: 3.9 mmol/L (ref 3.5–5.1)
SODIUM: 138 mmol/L (ref 135–145)

## 2018-03-06 LAB — HEMOGLOBIN A1C
HEMOGLOBIN A1C: 6.7 % — AB (ref 4.8–5.6)
MEAN PLASMA GLUCOSE: 145.59 mg/dL

## 2018-03-11 ENCOUNTER — Ambulatory Visit (HOSPITAL_COMMUNITY)
Admission: RE | Admit: 2018-03-11 | Discharge: 2018-03-11 | Disposition: A | Discharge status: Home / Self Care | Payer: Medicaid Other | Source: Ambulatory Visit | Attending: Urology | Admitting: Urology

## 2018-03-11 ENCOUNTER — Ambulatory Visit (HOSPITAL_COMMUNITY): Payer: Medicaid Other | Admitting: Anesthesiology

## 2018-03-11 ENCOUNTER — Encounter (HOSPITAL_COMMUNITY): Payer: Self-pay

## 2018-03-11 ENCOUNTER — Encounter (HOSPITAL_COMMUNITY)
Admission: RE | Disposition: A | Discharge status: Home / Self Care | Payer: Self-pay | Source: Ambulatory Visit | Attending: Urology

## 2018-03-11 DIAGNOSIS — I10 Essential (primary) hypertension: Secondary | ICD-10-CM | POA: Diagnosis not present

## 2018-03-11 DIAGNOSIS — N138 Other obstructive and reflux uropathy: Secondary | ICD-10-CM

## 2018-03-11 DIAGNOSIS — I2510 Atherosclerotic heart disease of native coronary artery without angina pectoris: Secondary | ICD-10-CM | POA: Diagnosis not present

## 2018-03-11 DIAGNOSIS — I252 Old myocardial infarction: Secondary | ICD-10-CM | POA: Insufficient documentation

## 2018-03-11 DIAGNOSIS — R3914 Feeling of incomplete bladder emptying: Secondary | ICD-10-CM | POA: Diagnosis not present

## 2018-03-11 DIAGNOSIS — Z87891 Personal history of nicotine dependence: Secondary | ICD-10-CM | POA: Diagnosis not present

## 2018-03-11 DIAGNOSIS — N401 Enlarged prostate with lower urinary tract symptoms: Secondary | ICD-10-CM | POA: Insufficient documentation

## 2018-03-11 DIAGNOSIS — Z955 Presence of coronary angioplasty implant and graft: Secondary | ICD-10-CM | POA: Diagnosis not present

## 2018-03-11 DIAGNOSIS — E119 Type 2 diabetes mellitus without complications: Secondary | ICD-10-CM | POA: Insufficient documentation

## 2018-03-11 DIAGNOSIS — Z96653 Presence of artificial knee joint, bilateral: Secondary | ICD-10-CM | POA: Insufficient documentation

## 2018-03-11 HISTORY — PX: CYSTOSCOPY WITH INSERTION OF UROLIFT: SHX6678

## 2018-03-11 LAB — GLUCOSE, CAPILLARY
GLUCOSE-CAPILLARY: 122 mg/dL — AB (ref 65–99)
Glucose-Capillary: 138 mg/dL — ABNORMAL HIGH (ref 65–99)

## 2018-03-11 SURGERY — CYSTOSCOPY WITH INSERTION OF UROLIFT
Anesthesia: General | Site: Bladder

## 2018-03-11 MED ORDER — CEFAZOLIN SODIUM-DEXTROSE 2-4 GM/100ML-% IV SOLN
2.0000 g | INTRAVENOUS | Status: AC
  Administered 2018-03-11: 2 g via INTRAVENOUS
  Filled 2018-03-11: qty 100

## 2018-03-11 MED ORDER — LACTATED RINGERS IV SOLN
INTRAVENOUS | Status: DC
  Administered 2018-03-11: 09:00:00 via INTRAVENOUS

## 2018-03-11 MED ORDER — LIDOCAINE HCL (CARDIAC) PF 100 MG/5ML IV SOSY
PREFILLED_SYRINGE | INTRAVENOUS | Status: DC | PRN
  Administered 2018-03-11: 40 mg via INTRAVENOUS

## 2018-03-11 MED ORDER — SEVOFLURANE IN SOLN
RESPIRATORY_TRACT | Status: AC
  Filled 2018-03-11: qty 250

## 2018-03-11 MED ORDER — SODIUM CHLORIDE 0.9 % IR SOLN
Status: DC | PRN
  Administered 2018-03-11: 3000 mL

## 2018-03-11 MED ORDER — MIDAZOLAM HCL 5 MG/5ML IJ SOLN
INTRAMUSCULAR | Status: DC | PRN
  Administered 2018-03-11: 1 mg via INTRAVENOUS

## 2018-03-11 MED ORDER — MEPERIDINE HCL 50 MG/ML IJ SOLN: 6.2500 mg | INTRAMUSCULAR | Status: DC | PRN

## 2018-03-11 MED ORDER — PROPOFOL 10 MG/ML IV BOLUS
INTRAVENOUS | Status: DC | PRN
  Administered 2018-03-11: 50 mg via INTRAVENOUS
  Administered 2018-03-11: 150 mg via INTRAVENOUS
  Administered 2018-03-11: 30 mg via INTRAVENOUS

## 2018-03-11 MED ORDER — LACTATED RINGERS IV SOLN: INTRAVENOUS | Status: DC

## 2018-03-11 MED ORDER — STERILE WATER FOR IRRIGATION IR SOLN
Status: DC | PRN
  Administered 2018-03-11: 1000 mL

## 2018-03-11 MED ORDER — HYDROCODONE-ACETAMINOPHEN 7.5-325 MG PO TABS
1.0000 | ORAL_TABLET | Freq: Once | ORAL | Status: AC | PRN
  Administered 2018-03-11: 1 via ORAL
  Filled 2018-03-11: qty 1

## 2018-03-11 MED ORDER — HYDROMORPHONE HCL 1 MG/ML IJ SOLN: 0.2500 mg | INTRAMUSCULAR | Status: DC | PRN

## 2018-03-11 MED ORDER — FENTANYL CITRATE (PF) 100 MCG/2ML IJ SOLN
INTRAMUSCULAR | Status: AC
  Filled 2018-03-11: qty 2

## 2018-03-11 MED ORDER — PROMETHAZINE HCL 25 MG/ML IJ SOLN: 6.2500 mg | INTRAMUSCULAR | Status: DC | PRN

## 2018-03-11 MED ORDER — EPHEDRINE SULFATE 50 MG/ML IJ SOLN
INTRAMUSCULAR | Status: DC | PRN
  Administered 2018-03-11: 10 mg via INTRAVENOUS

## 2018-03-11 MED ORDER — PROPOFOL 10 MG/ML IV BOLUS
INTRAVENOUS | Status: AC
  Filled 2018-03-11: qty 20

## 2018-03-11 MED ORDER — MIDAZOLAM HCL 2 MG/2ML IJ SOLN
INTRAMUSCULAR | Status: AC
  Filled 2018-03-11: qty 2

## 2018-03-11 MED ORDER — TRAMADOL HCL 50 MG PO TABS: 50.0000 mg | ORAL_TABLET | Freq: Four times a day (QID) | ORAL | 0 refills | Status: DC | PRN

## 2018-03-11 MED ORDER — FENTANYL CITRATE (PF) 100 MCG/2ML IJ SOLN
INTRAMUSCULAR | Status: DC | PRN
  Administered 2018-03-11: 50 ug via INTRAVENOUS
  Administered 2018-03-11 (×2): 25 ug via INTRAVENOUS

## 2018-03-11 SURGICAL SUPPLY — 17 items
BAG DRAIN URO TABLE W/ADPT NS (DRAPE) ×3 IMPLANT
CLOTH BEACON ORANGE TIMEOUT ST (SAFETY) ×3 IMPLANT
GLOVE BIO SURGEON STRL SZ8 (GLOVE) ×3 IMPLANT
GLOVE BIOGEL PI IND STRL 7.0 (GLOVE) ×2 IMPLANT
GLOVE BIOGEL PI INDICATOR 7.0 (GLOVE) ×4
GLOVE ECLIPSE 6.5 STRL STRAW (GLOVE) ×3 IMPLANT
GOWN STRL REUS W/TWL LRG LVL3 (GOWN DISPOSABLE) ×3 IMPLANT
GOWN STRL REUS W/TWL XL LVL3 (GOWN DISPOSABLE) ×3 IMPLANT
HOLDER FOLEY CATH W/STRAP (MISCELLANEOUS) ×3 IMPLANT
IV NS IRRIG 3000ML ARTHROMATIC (IV SOLUTION) ×3 IMPLANT
KIT TURNOVER CYSTO (KITS) ×3 IMPLANT
MANIFOLD NEPTUNE II (INSTRUMENTS) ×3 IMPLANT
PACK CYSTO (CUSTOM PROCEDURE TRAY) ×3 IMPLANT
PAD ARMBOARD 7.5X6 YLW CONV (MISCELLANEOUS) ×3 IMPLANT
SYSTEM UROLIFT (Male Continence) ×12 IMPLANT
TRAY FOLEY CATH 16FR SILVER (SET/KITS/TRAYS/PACK) ×3 IMPLANT
WATER STERILE IRR 1000ML POUR (IV SOLUTION) ×3 IMPLANT

## 2018-03-11 NOTE — H&P (Signed)
Urology Admission H&P  Chief Complaint: incomplete emptying  History of Present Illness: Darius Rogers is a 62yo with a hx of BPH and incomplete emptying who has failed medical therapy. He cannot tolerate alpha blocker therapy. He has severe LUTS  Past Medical History:  Diagnosis Date  . Arthritis    "knees" (11/13/2017)  . CAD (coronary artery disease)   . Constipation   . Heart attack ([place]) 2014   DES in [place]  . Hyperlipemia   . Hypertension   . Type II diabetes mellitus ([place])    Past Surgical History:  Procedure Laterality Date  . COLONOSCOPY    . CORONARY ANGIOPLASTY WITH STENT PLACEMENT  2014  . FOREARM FRACTURE SURGERY Right 1975  . FRACTURE SURGERY Right 1975   R arm   . JOINT REPLACEMENT    . PAROTIDECTOMY Right 02/19/2018   Procedure: RIGHT PAROTIDECTOMY;  Surgeon: [name], MD;  Location: [facility];  Service: ENT;  Laterality: Right;  RNFA  . TOTAL KNEE ARTHROPLASTY Right 05/08/2017   Procedure: RIGHT TOTAL KNEE ARTHROPLASTY;  Surgeon: Garald Balding, MD;  Location: [facility];  Service: Orthopedics;  Laterality: Right;  . TOTAL KNEE ARTHROPLASTY Left 11/13/2017  . TOTAL KNEE ARTHROPLASTY Left 11/13/2017   Procedure: LEFT TOTAL KNEE ARTHROPLASTY;  Surgeon: Garald Balding, MD;  Location: [facility];  Service: Orthopedics;  Laterality: Left;    Home Medications:  Current Facility-Administered Medications  Medication Dose Route Frequency Provider Last Rate Last Dose  . ceFAZolin (ANCEF) IVPB 2g/100 mL premix  2 g Intravenous 30 min Pre-Op Cleon Gustin, MD      . lactated ringers infusion   Intravenous Continuous Vena Rua, MD 50 mL/hr at 03/11/18 0859     Allergies:  Allergies  Allergen Reactions  . Bactrim [Sulfamethoxazole-Trimethoprim] Hives  . Other Hives    Chili cheese fritos    Family History  Problem Relation Age of Onset  . Hypertension Mother   . Diabetes Mother   . Post-traumatic stress disorder Father   . Sleep apnea  Father   . Other Maternal Grandmother        PPM  . Heart attack Maternal Grandfather    Social History:  reports that he quit smoking about 4 years ago. His smoking use included cigarettes. He started smoking about 13 years ago. He has a 5.50 pack-year smoking history. He has never used smokeless tobacco. He reports that he does not drink alcohol or use drugs.  Review of Systems  Genitourinary: Positive for frequency and urgency.  All other systems reviewed and are negative.   Physical Exam:  Vital signs in last 24 hours: Temp:  [98 F (36.7 C)] 98 F (36.7 C) (06/03 0829) Pulse Rate:  [64] 64 (06/03 0829) Resp:  [11-17] 11 (06/03 0935) BP: (103-106)/(62-65) 104/62 (06/03 0930) SpO2:  [91 %-95 %] 95 % (06/03 0935) Physical Exam  Constitutional: He is oriented to person, place, and time. He appears well-developed and well-nourished.  HENT:  Head: Normocephalic and atraumatic.  Eyes: Pupils are equal, round, and reactive to light. EOM are normal.  Neck: Normal range of motion. No thyromegaly present.  Cardiovascular: Normal rate and regular rhythm.  Respiratory: Effort normal. No respiratory distress.  GI: Soft. He exhibits no distension.  Musculoskeletal: Normal range of motion. He exhibits no edema.  Neurological: He is alert and oriented to person, place, and time.  Skin: Skin is warm and dry.  Psychiatric: He has a normal mood and  affect. His behavior is normal. Judgment and thought content normal.    Laboratory Data:  Results for orders placed or performed during the hospital encounter of 03/11/18 (from the past 24 hour(s))  Glucose, capillary     Status: Abnormal   Collection Time: 03/11/18  8:09 AM  Result Value Ref Range   Glucose-Capillary 138 (H) 65 - 99 mg/dL   No results found for this or any previous visit (from the past 240 hour(s)). Creatinine: Recent Labs    03/06/18 1408  CREATININE 1.00   Baseline Creatinine: 1  Impression/Assessment:  62yo with  BPH with LUTS, incomplete bladder emptying  Plan:  The risks/benefits/alternatives to Urolift was explained to the patient and he understands and wishes to proceed with surgery  Darius Rogers 03/11/2018, 10:01 AM

## 2018-03-11 NOTE — Anesthesia Postprocedure Evaluation (Signed)
Anesthesia Post Note Late entry for 1110 Patient: Darius Rogers  Procedure(s) Performed: CYSTOSCOPY WITH INSERTION OF UROLIFT (N/A Bladder)  Patient location during evaluation: PACU Anesthesia Type: General Level of consciousness: awake and alert and oriented Pain management: pain level controlled Vital Signs Assessment: post-procedure vital signs reviewed and stable Respiratory status: spontaneous breathing Cardiovascular status: stable Postop Assessment: no apparent nausea or vomiting Anesthetic complications: no     Last Vitals:  Vitals:   03/11/18 1115 03/11/18 1123  BP: 119/77 121/78  Pulse: 61 61  Resp: 10 18  Temp:  36.5 C  SpO2: 100% 98%    Last Pain:  Vitals:   03/11/18 1123  TempSrc: Oral  PainSc:                  Jacora Hopkins A

## 2018-03-11 NOTE — Discharge Instructions (Signed)
Cystoscopy, Care After Refer to this sheet in the next few weeks. These instructions provide you with information about caring for yourself after your procedure. Your health care provider may also give you more specific instructions. Your treatment has been planned according to current medical practices, but problems sometimes occur. Call your health care provider if you have any problems or questions after your procedure. What can I expect after the procedure? After the procedure, it is common to have:  Mild pain when you urinate. Pain should stop within a few minutes after you urinate. This may last for up to 1 week.  A small amount of blood in your urine for several days.  Feeling like you need to urinate but producing only a small amount of urine.  Follow these instructions at home:  Medicines  Take over-the-counter and prescription medicines only as told by your health care provider.  If you were prescribed an antibiotic medicine, take it as told by your health care provider. Do not stop taking the antibiotic even if you start to feel better. General instructions   Return to your normal activities as told by your health care provider. Ask your health care provider what activities are safe for you.  Do not drive for 24 hours if you received a sedative.  Watch for any blood in your urine. If the amount of blood in your urine increases, call your health care provider.  Follow instructions from your health care provider about eating or drinking restrictions.  If a tissue sample was removed for testing (biopsy) during your procedure, it is your responsibility to get your test results. Ask your health care provider or the department performing the test when your results will be ready.  Drink enough fluid to keep your urine clear or pale yellow.  Keep all follow-up visits as told by your health care provider. This is important. Contact a health care provider if:  You have pain that  gets worse or does not get better with medicine, especially pain when you urinate.  You have difficulty urinating. Get help right away if:  You have more blood in your urine.  You have blood clots in your urine.  You have abdominal pain.  You have a fever or chills.  You are unable to urinate. This information is not intended to replace advice given to you by your health care provider. Make sure you discuss any questions you have with your health care provider. Document Released: 04/14/2005 Document Revised: 03/02/2016 Document Reviewed: 08/12/2015 Elsevier Interactive Patient Education  2018 [place], Adult Taking good care of your catheter will keep it working properly and help prevent problems from developing. How to wear your catheter Attach your catheter to your leg with adhesive tape or a leg strap. Make sure there is no tension on the catheter. If you use adhesive tape, first remove any sticky residue from the previous tape you used. How to wear a drainage bag  You should have received a large overnight drainage bag and a smaller leg bag that fits underneath clothing. You may wear the overnight bag at any time, but you should never wear the smaller leg bag at night.  Always keep the overnight drainage bag below the level of your bladder, but keep it off the floor. When you sleep, hang the bag inside a wastebasket that is covered by a clean plastic bag.  Always wear the leg bag below your knee. Keep the leg bag secure with  a leg strap or adhesive tape. How to care for your skin  Clean the skin around the catheter at least once every day.  Shower every day. Do not take baths.  Apply creams, lotions, or ointments to your genital area only as told by your health care provider.  Do not use powders, sprays, or lotions on your genital area. How to clean your catheter and your skin 1. Wash your hands with soap and water. 2. Wet a washcloth  in warm water and mild soap. 3. Use the washcloth to clean the skin where the catheter enters your body. Clean downward, wiping away from the catheter in small circles. Do not wipe toward the catheter. This can push bacteria into the urethra and cause infection. 4. Pat the area dry with a clean towel. Make sure to remove all soap. How to care for your drainage bags Empty your drainage bag when it is ?- full, or at least 2-3 times a day. Replace your drainage bag once a month or sooner if it starts to smell bad or look dirty. Do not clean your drainage bag unless told by your health care provider. Emptying a drainage bag  Supplies Needed  Rubbing alcohol.  Gauze pad or cotton ball.  Adhesive tape or a leg strap.  Steps 1. Wash your hands with soap and water. 2. Detach the drainage bag from your leg. 3. Hold the drainage bag over the toilet or a clean container. Keep the drainage bag below your hips and bladder. This stops urine from going back into the tubing and into your bladder. 4. Open the pour spout at the bottom of the bag. 5. Empty the urine into the toilet or container. Do not let the pour spout touch any surface. This helps keep bacteria out of the bag and helps prevent infection. 6. Apply rubbing alcohol to a gauze pad or cotton ball. 7. Use the gauze pad or cotton ball to clean the pour spout. 8. Close the pour spout. 9. Attach the bag to your leg with adhesive tape or a leg strap. 10. Wash your hands.  Changing a drainage bag Supplies Needed  Alcohol wipes.  A clean drainage bag.  Adhesive tape or a leg strap.  Steps 1. Wash your hands with soap and water. 2. Detach the dirty drainage bag from your leg. 3. Pinch the rubber catheter with your fingers so that urine does not spill out. 4. Disconnect the catheter tube from the drainage tube at the connection valve. Do not let the tubes touch any surface. 5. Clean the end of the catheter tube with an alcohol wipe. Use  a different alcohol wipe to clean the end of the drainage tube. 6. Connect the catheter tube to the drainage tube of the clean drainage bag. 7. Attach the new bag to your leg with adhesive tape or a leg strap. Avoid attaching the new bag too tightly. 8. Wash your hands.  How to prevent infection and other problems  Never pull on your catheter or try to remove it. Pulling can damage your internal tissues.  Always wash your hands before and after handling your catheter.  If a leg strap gets wet, replace it with a dry one.  Drink enough fluids to keep your urine clear or pale yellow, or as told by your health care provider.  Do not let the drainage bag or tubing touch the floor.  Wear cotton underwear. Cotton absorbs moisture and keeps your skin dry.  If you  are male, wipe from front to back after each bowel movement.  Check the catheter often to make sure that it works properly and the tubing is not twisted or curled. Contact a health care provider if:  Your urine is cloudy.  Your urine smells unusually bad.  Your urine is not draining into the bag.  Your catheter gets clogged.  Your catheter starts to leak.  Your bladder feels full. Get help right away if:  You have redness, swelling, or pain where the catheter enters your body.  You have fluid, pus, or a bad smell coming from the area where the catheter enters your body.  The area where the catheter enters your body feels warm to the touch.  You have a fever.  You have pain in your abdomen, legs, lower back, or bladder.  You see blood fill the catheter.  Your urine is pink or red.  You have nausea, vomiting, or chills.  Your catheter gets pulled out. This information is not intended to replace advice given to you by your health care provider. Make sure you discuss any questions you have with your health care provider. Document Released: 09/25/2005 Document Revised: 08/23/2016 Document Reviewed:  03/10/2014 Elsevier Interactive Patient Education  2018 [employer].

## 2018-03-11 NOTE — Transfer of Care (Signed)
Immediate Anesthesia Transfer of Care Note  Patient: Darius Rogers  Procedure(s) Performed: CYSTOSCOPY WITH INSERTION OF UROLIFT (N/A Bladder)  Patient Location: PACU  Anesthesia Type:General  Level of Consciousness: awake, alert , oriented and patient cooperative  Airway & Oxygen Therapy: Patient Spontanous Breathing and Patient connected to nasal cannula oxygen  Post-op Assessment: Report given to RN and Post -op Vital signs reviewed and stable  Post vital signs: Reviewed and stable  Last Vitals:  Vitals Value Taken Time  BP 111/78 03/11/2018 10:51 AM  Temp    Pulse 53 03/11/2018 10:53 AM  Resp 18 03/11/2018 10:53 AM  SpO2 97 % 03/11/2018 10:53 AM  Vitals shown include unvalidated device data.  Last Pain:  Vitals:   03/11/18 0829  TempSrc: Oral  PainSc: 0-No pain         Complications: No apparent anesthesia complications

## 2018-03-11 NOTE — Anesthesia Preprocedure Evaluation (Signed)
Anesthesia Evaluation  Patient identified by MRN, date of birth, ID band Patient awake    Reviewed: Allergy & Precautions, H&P , NPO status , Patient's Chart, lab work & pertinent test results, reviewed documented beta blocker date and time   Airway Mallampati: III  TM Distance: >3 FB Neck ROM: full    Dental no notable dental hx. (+) Teeth Intact, Dental Advidsory Given   Pulmonary neg pulmonary ROS, former smoker,    Pulmonary exam normal breath sounds clear to auscultation       Cardiovascular Exercise Tolerance: Good hypertension, + CAD, + Past MI and + Cardiac Stents  negative cardio ROS   Rhythm:regular Rate:Normal     Neuro/Psych negative neurological ROS  negative psych ROS   GI/Hepatic negative GI ROS, Neg liver ROS,   Endo/Other  negative endocrine ROSdiabetes, Well Controlled, Type 2  Renal/GU negative Renal ROS  negative genitourinary   Musculoskeletal   Abdominal   Peds  Hematology negative hematology ROS (+)   Anesthesia Other Findings 4 yrs s/p MI and single cardiac stent.  Asymptomatic since stent placed  Reproductive/Obstetrics negative OB ROS                             Anesthesia Physical Anesthesia Plan  ASA: III  Anesthesia Plan: General LMA   Post-op Pain Management:    Induction:   PONV Risk Score and Plan:   Airway Management Planned:   Additional Equipment:   Intra-op Plan:   Post-operative Plan:   Informed Consent: I have reviewed the patients History and Physical, chart, labs and discussed the procedure including the risks, benefits and alternatives for the proposed anesthesia with the patient or authorized representative who has indicated his/her understanding and acceptance.   Dental Advisory Given  Plan Discussed with: CRNA and Anesthesiologist  Anesthesia Plan Comments:         Anesthesia Quick Evaluation

## 2018-03-11 NOTE — Anesthesia Procedure Notes (Signed)
Procedure Name: LMA Insertion Date/Time: 03/11/2018 10:20 AM Performed by: Andree Elk, Amy A, CRNA Pre-anesthesia Checklist: Patient identified, Timeout performed, Emergency Drugs available, Suction available and Patient being monitored Patient Re-evaluated:Patient Re-evaluated prior to induction Oxygen Delivery Method: Simple face mask Preoxygenation: Pre-oxygenation with 100% oxygen Induction Type: IV induction Ventilation: Mask ventilation with difficulty LMA: LMA inserted LMA Size: 5.0 Number of attempts: 1 Placement Confirmation: positive ETCO2 and breath sounds checked- equal and bilateral Tube secured with: Tape Dental Injury: Teeth and Oropharynx as per pre-operative assessment

## 2018-03-11 NOTE — Op Note (Signed)
   PREOPERATIVE DIAGNOSIS: Benign prostatic hypertrophy with bladder outlet obstruction and incomplete emptying.  POSTOPERATIVE DIAGNOSIS: Benign prostatic hypertrophy with bladder outlet obstruction and incomplete emptying.  PROCEDURE: Cystoscopy with implantation of UroLift devices, 4 implants.  SURGEON: Nicolette Bang, M.D.  ANESTHESIA: General  ANTIBIOTICS: ancef  SPECIMEN: None.  DRAINS: A 16-French Foley catheter.  BLOOD LOSS: Minimal.  COMPLICATIONS: None.  FINDINGS: bilobar hyperplasia. Open channel after placement of 4 implants  INDICATIONS:The Patient is an 62 year old white male with BPH and bladder outlet obstruction with incomplete emptying. He has failed medical therapy and has elected UroLift for definitive treatment.  FINDINGS OF PROCEDURE: He was taken to the operating room where a genral anesthetic was induced. He was placed in lithotomy position and was fitted with PAS hose. His perineum and genitalia were prepped with chlorhexidine, and he was draped in usual sterile fashion.  Cystoscopy was performed using the UroLift scope and 0 degree lens. Examination revealed a normal urethra. The external sphincter was intact. Prostatic urethra was approximately 4 cm in length with lateral lobe enlargement. There was also little bit of bladder neck elevation. Inspection of bladder revealed mild-to-moderate trabeculation with no tumors, stones, or inflammation. No cellules or diverticula were noted. Ureteral orifices were in their normal anatomic position effluxing clear urine.  After initial cystoscopy, the visual obturator was replaced with the first UroLift device. This was turned to the 9 o'clock position and pulled back to the veru and then slightly advanced. Pressure was then applied to the right lateral lobe and the UroLift device was deployed.  The second UroLift device was then inserted and applied to the left lateral lobe at 3  o'clock and deployed in the mid prostatic urethra. After this, there was still some apparent obstruction closer to the bladder neck. So a second level of UroLift your left device was applied between the mid urethra and the proximal urethra providing further patency to the prostatic urethra. At this point, there was mild bleeding but the patient did have a spinal anesthetic. So it was thought that a Foley catheter was indicated. The scope was removed and a 16-French Foley catheter was inserted without difficulty. The balloon was filled with 10 mL sterile fluid, and the catheter was placed to straight drainage.  COMPLICATIONS: None   CONDITION: Stable, extubated, transferred to PACU  PLAN: The patient will be discharged home and followup in 2 days for a voiding trial.

## 2018-03-12 ENCOUNTER — Encounter (HOSPITAL_COMMUNITY): Payer: Self-pay | Admitting: Urology

## 2018-03-13 ENCOUNTER — Ambulatory Visit (INDEPENDENT_AMBULATORY_CARE_PROVIDER_SITE_OTHER): Payer: Medicaid Other | Admitting: Urology

## 2018-03-13 DIAGNOSIS — N401 Enlarged prostate with lower urinary tract symptoms: Secondary | ICD-10-CM

## 2018-03-27 ENCOUNTER — Ambulatory Visit (INDEPENDENT_AMBULATORY_CARE_PROVIDER_SITE_OTHER): Payer: Medicaid Other | Admitting: Urology

## 2018-03-27 DIAGNOSIS — R351 Nocturia: Secondary | ICD-10-CM | POA: Diagnosis not present

## 2018-03-27 DIAGNOSIS — N401 Enlarged prostate with lower urinary tract symptoms: Secondary | ICD-10-CM

## 2018-04-01 ENCOUNTER — Encounter: Payer: Self-pay | Admitting: Hematology & Oncology

## 2018-04-01 ENCOUNTER — Inpatient Hospital Stay: Payer: Medicaid Other

## 2018-04-01 ENCOUNTER — Inpatient Hospital Stay: Payer: Medicaid Other | Attending: Hematology & Oncology | Admitting: Hematology & Oncology

## 2018-04-01 ENCOUNTER — Other Ambulatory Visit: Payer: Self-pay

## 2018-04-01 VITALS — BP 129/86 | HR 70 | Temp 98.2°F | Resp 16 | Ht 72.0 in | Wt 242.1 lb

## 2018-04-01 DIAGNOSIS — D229 Melanocytic nevi, unspecified: Secondary | ICD-10-CM

## 2018-04-01 DIAGNOSIS — C07 Malignant neoplasm of parotid gland: Secondary | ICD-10-CM

## 2018-04-01 DIAGNOSIS — Z9049 Acquired absence of other specified parts of digestive tract: Secondary | ICD-10-CM

## 2018-04-01 DIAGNOSIS — E785 Hyperlipidemia, unspecified: Secondary | ICD-10-CM | POA: Insufficient documentation

## 2018-04-01 DIAGNOSIS — I10 Essential (primary) hypertension: Secondary | ICD-10-CM | POA: Diagnosis not present

## 2018-04-01 DIAGNOSIS — Z79899 Other long term (current) drug therapy: Secondary | ICD-10-CM | POA: Insufficient documentation

## 2018-04-01 DIAGNOSIS — Z7984 Long term (current) use of oral hypoglycemic drugs: Secondary | ICD-10-CM | POA: Diagnosis not present

## 2018-04-01 DIAGNOSIS — E119 Type 2 diabetes mellitus without complications: Secondary | ICD-10-CM | POA: Diagnosis not present

## 2018-04-01 DIAGNOSIS — I2510 Atherosclerotic heart disease of native coronary artery without angina pectoris: Secondary | ICD-10-CM | POA: Insufficient documentation

## 2018-04-01 DIAGNOSIS — Z87891 Personal history of nicotine dependence: Secondary | ICD-10-CM | POA: Insufficient documentation

## 2018-04-01 DIAGNOSIS — Z800 Family history of malignant neoplasm of digestive organs: Secondary | ICD-10-CM | POA: Diagnosis not present

## 2018-04-01 DIAGNOSIS — I252 Old myocardial infarction: Secondary | ICD-10-CM

## 2018-04-01 DIAGNOSIS — L988 Other specified disorders of the skin and subcutaneous tissue: Secondary | ICD-10-CM | POA: Insufficient documentation

## 2018-04-01 DIAGNOSIS — Z7982 Long term (current) use of aspirin: Secondary | ICD-10-CM | POA: Diagnosis not present

## 2018-04-01 LAB — CBC WITH DIFFERENTIAL (CANCER CENTER ONLY)
Basophils Absolute: 0 10*3/uL (ref 0.0–0.1)
Basophils Relative: 0 %
EOS PCT: 4 %
Eosinophils Absolute: 0.4 10*3/uL (ref 0.0–0.5)
HEMATOCRIT: 46.2 % (ref 38.7–49.9)
Hemoglobin: 14.9 g/dL (ref 13.0–17.1)
LYMPHS ABS: 4 10*3/uL — AB (ref 0.9–3.3)
LYMPHS PCT: 40 %
MCH: 28.3 pg (ref 28.0–33.4)
MCHC: 32.3 g/dL (ref 32.0–35.9)
MCV: 87.7 fL (ref 82.0–98.0)
MONO ABS: 0.8 10*3/uL (ref 0.1–0.9)
MONOS PCT: 8 %
NEUTROS ABS: 5 10*3/uL (ref 1.5–6.5)
Neutrophils Relative %: 48 %
PLATELETS: 286 10*3/uL (ref 145–400)
RBC: 5.27 MIL/uL (ref 4.20–5.70)
RDW: 14.9 % (ref 11.1–15.7)
WBC Count: 10.2 10*3/uL — ABNORMAL HIGH (ref 4.0–10.0)

## 2018-04-01 LAB — CMP (CANCER CENTER ONLY)
ALT: 39 U/L (ref 0–55)
AST: 34 U/L (ref 5–34)
Albumin: 3.9 g/dL (ref 3.5–5.0)
Alkaline Phosphatase: 84 U/L (ref 40–150)
Anion gap: 10 (ref 3–11)
BILIRUBIN TOTAL: 0.3 mg/dL (ref 0.2–1.2)
BUN: 13 mg/dL (ref 7–26)
CO2: 24 mmol/L (ref 22–29)
CREATININE: 0.97 mg/dL (ref 0.70–1.30)
Calcium: 9.7 mg/dL (ref 8.4–10.4)
Chloride: 103 mmol/L (ref 98–109)
Glucose, Bld: 154 mg/dL — ABNORMAL HIGH (ref 70–140)
POTASSIUM: 4.3 mmol/L (ref 3.5–5.1)
Sodium: 137 mmol/L (ref 136–145)
TOTAL PROTEIN: 7.4 g/dL (ref 6.4–8.3)

## 2018-04-01 NOTE — Progress Notes (Signed)
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 New Patient (Initial Visit)  : Had a tumor taken off my right face.  HPI: [name] is a really nice 62 year old white male.  He is originally from [place].  He had been living in [place] for many years.  He subsequently has come up to [place], where his parents live so he can help them out.  He is a Technical sales engineer.  He actually has a fianc who is a Marine scientist in the [place].  He goes and sees her on occasion.  He is not sure when they will get married.  He began to note a swelling on the right side of his face near his right ear.  This was not associated with any hearing loss.  There is no neurologic issue.  He had no fever.  He had no swollen lymph nodes in his neck.  He apparently had a CT scan done.  Unfortunately, I do not have the results of the CT scan.  There apparently was a tumor there.  He was seen by otolaryngology.  He underwent a right parotidectomy by Dr. Benjamine Mola on Feb 19, 2018.  The pathology report ([id number]) showed an epithelial myoepithelial carcinoma.  It was minimally invasive.  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  It measured 1.8 cm in size.  There was some adjacent adipose tissue extension.  All margins were negative.  There is no lymphovascular space invasion.  There is no perineural invasion.  No lymph nodes were sampled.  It appears that this is a stage I (T1NxM0) tumor.  He was then referred to the [facility] for evaluation and surveillance.  He feels well.  He has a little bit of numbness behind his right ear.  He has had no problems swallowing.  He has had no cough.  He has had no bleeding.  He has had no weight loss or weight gain.  He used to smoke.  He used to drink.  He stopped these several years ago.  I think there is a history of stomach cancer in a paternal  grandparent.  Currently, his performance status is ECOG 0.     Past Medical History:  Diagnosis Date  . Arthritis    "knees" (11/13/2017)  . CAD (coronary artery disease)   . Constipation   . Heart attack ([facility]) 2014   DES in [place]  . Hyperlipemia   . Hypertension   . Type II diabetes mellitus ([facility] [facility])   :  Past Surgical History:  Procedure Laterality Date  . COLONOSCOPY    . CORONARY ANGIOPLASTY WITH STENT PLACEMENT  2014  . CYSTOSCOPY WITH INSERTION OF UROLIFT N/A 03/11/2018   Procedure: CYSTOSCOPY WITH INSERTION OF UROLIFT;  Surgeon: Cleon Gustin, MD;  Location: [facility];  Service: Urology;  Laterality: N/A;  . FOREARM FRACTURE SURGERY Right 1975  . FRACTURE SURGERY Right 1975   R arm   . JOINT REPLACEMENT    . PAROTIDECTOMY Right 02/19/2018   Procedure: RIGHT PAROTIDECTOMY;  Surgeon: [name], MD;  Location: [facility];  Service: ENT;  Laterality: Right;  RNFA  . TOTAL KNEE ARTHROPLASTY Right 05/08/2017   Procedure: RIGHT TOTAL KNEE ARTHROPLASTY;  Surgeon: Garald Balding, MD;  Location: [facility];  Service: Orthopedics;  Laterality: Right;  . TOTAL KNEE ARTHROPLASTY Left 11/13/2017  . TOTAL KNEE ARTHROPLASTY Left  11/13/2017   Procedure: LEFT TOTAL KNEE ARTHROPLASTY;  Surgeon: Garald Balding, MD;  Location: [facility];  Service: Orthopedics;  Laterality: Left;  :   Current Outpatient Medications:  .  aspirin EC 81 MG tablet, Take 81 mg by mouth daily., Disp: , Rfl:  .  atorvastatin (LIPITOR) 40 MG tablet, TAKE 1 TABLET BY MOUTH ONCE DAILY (Patient taking differently: TAKE 1 TABLET BY MOUTH ONCE DAILY AT NIGHT), Disp: 90 tablet, Rfl: 3 .  carvedilol (COREG) 12.5 MG tablet, TAKE ONE TABLET BY MOUTH TWICE DAILY, Disp: 60 tablet, Rfl: 6 .  lisinopril (PRINIVIL,ZESTRIL) 20 MG tablet, TAKE ONE TABLET BY MOUTH ONCE DAILY (Patient taking differently: TAKE ONE TABLET BY MOUTH ONCE DAILY AT NIGHT), Disp: 90 tablet, Rfl: 1 .  loratadine (CLARITIN) 10 MG tablet, Take 10 mg by  mouth daily as needed for allergies., Disp: , Rfl:  .  metFORMIN (GLUCOPHAGE) 500 MG tablet, Take 1 tablet (500 mg total) by mouth 2 (two) times daily., Disp: 60 tablet, Rfl: 0 .  Sennosides (LAXATIVE) 25 MG TABS, Take 25 mg by mouth at bedtime as needed (constipation)., Disp: , Rfl: :  :  Allergies  Allergen Reactions  . Bactrim [Sulfamethoxazole-Trimethoprim] Hives  . Other Hives    Chili cheese fritos  :  Family History  Problem Relation Age of Onset  . Hypertension Mother   . Diabetes Mother   . Post-traumatic stress disorder Father   . Sleep apnea Father   . Other Maternal Grandmother        PPM  . Heart attack Maternal Grandfather   :  Social History   Socioeconomic History  . Marital status: Divorced    Spouse name: Not on file  . Number of children: Not on file  . Years of education: Not on file  . Highest education level: Not on file  Occupational History  . Not on file  Social Needs  . Financial resource strain: Not on file  . Food insecurity:    Worry: Not on file    Inability: Not on file  . Transportation needs:    Medical: Not on file    Non-medical: Not on file  Tobacco Use  . Smoking status: Former Smoker    Packs/day: 0.50    Years: 11.00    Pack years: 5.50    Types: Cigarettes    Start date: 10/09/2004    Last attempt to quit: 05/09/2013    Years since quitting: 4.8  . Smokeless tobacco: Never Used  Substance and Sexual Activity  . Alcohol use: No    Alcohol/week: 0.0 oz  . Drug use: No  . Sexual activity: Not Currently  Lifestyle  . Physical activity:    Days per week: Not on file    Minutes per session: Not on file  . Stress: Not on file  Relationships  . Social connections:    Talks on phone: Not on file    Gets together: Not on file    Attends religious service: Not on file    Active member of club or organization: Not on file    Attends meetings of clubs or organizations: Not on file    Relationship status: Not on file  .  Intimate partner violence:    Fear of current or ex partner: Not on file    Emotionally abused: Not on file    Physically abused: Not on file    Forced sexual activity: Not on file  Other Topics Concern  .  Not on file  Social History Narrative  . Not on file  :  Review of Systems  Constitutional: Negative.   HENT: Negative.   Eyes: Negative.   Respiratory: Negative.   Cardiovascular: Negative.   Gastrointestinal: Negative.   Genitourinary: Negative.   Musculoskeletal: Negative.   Skin: Negative.   Neurological: Negative.   Endo/Heme/Allergies: Negative.   Psychiatric/Behavioral: Negative.      Exam: Well-developed and well-nourished white male in no obvious distress.  Vital signs show temperature of 98.2.  Pulse is 70.  Blood pressure is 129/86.  Weight is 242 pounds.  Head neck exam shows well-healed surgical scar behind the right ear and down the right neck.  There is no tenderness associated with this.  There is no adenopathy in the neck.  Has no oral lesions.  He has no ocular lesions.  He has good facial muscle function.  Thyroid is nonpalpable.  Lungs are clear bilaterally.  Cardiac exam regular rate and rhythm with no murmurs, rubs or bruits.  Abdomen is soft.  He has good bowel sounds.  There is no fluid wave.  There is no palpable liver or spleen tip.  Back exam shows no tenderness over the spine, ribs or hips.  Extremities shows no clubbing, cyanosis or edema.  Skin exam it does show a suspicious hyperpigmented lesion on the right upper abdominal wall.  It is irregular in shape.  It probably measures about 3-4 mm in size.  Neurological exam is nonfocal. @IPVITALS @   Recent Labs    04/01/18 0818  WBC 10.2*  HGB 14.9  HCT 46.2  PLT 286   Recent Labs    04/01/18 0818  NA 137  K 4.3  CL 103  CO2 24  GLUCOSE 154*  BUN 13  CREATININE 0.97  CALCIUM 9.7    Blood smear review: None  Pathology: As above    Assessment and Plan: Mr. Debono is a very nice  62 year old white male.  He has a low-grade parotid tumor.  This was an epithelial myoepithelial carcinoma.  It was early stage.  It was totally resected.  It had a good prognostic features in which there was no lymphovascular space invasion and no perineural invasion.  Unfortunately, no lymph nodes were sampled but I would have to imagine that the chance of these having any malignancy would be less than 5%.  I do not see any indication for adjuvant radiation therapy.  At this point, I think we just need to set up a surveillance plan.  I think the best surveillance plan would be a CT scan every 6 months for the first couple years.  I think the risk of recurrence is good to be less than 10%.  I am a little more worried about this lesion on his right upper abdominal wall.  We need to get him to a dermatologist.  He has fair skin.  This lesion is quite dark.  I think it needs to be removed.  We will see about making a referral to dermatology.  I spent about an hour with him.  All the time was spent face-to-face with him.  I reviewed his pathology report.  I went over his lab work.  I gave him my recommendations.  He agreed with what I was saying.  I answered all of his questions.  He will come back to see [place] in 6 months.  We see him back, we will do a CT scan of his neck.

## 2018-04-24 ENCOUNTER — Ambulatory Visit: Payer: Medicaid Other | Admitting: Cardiovascular Disease

## 2018-04-24 ENCOUNTER — Encounter: Payer: Self-pay | Admitting: Cardiovascular Disease

## 2018-04-24 ENCOUNTER — Encounter: Payer: Self-pay | Admitting: *Deleted

## 2018-04-24 VITALS — BP 113/73 | HR 74 | Ht 72.0 in | Wt 240.8 lb

## 2018-04-24 DIAGNOSIS — E785 Hyperlipidemia, unspecified: Secondary | ICD-10-CM | POA: Diagnosis not present

## 2018-04-24 DIAGNOSIS — I10 Essential (primary) hypertension: Secondary | ICD-10-CM

## 2018-04-24 DIAGNOSIS — Z955 Presence of coronary angioplasty implant and graft: Secondary | ICD-10-CM | POA: Diagnosis not present

## 2018-04-24 DIAGNOSIS — E119 Type 2 diabetes mellitus without complications: Secondary | ICD-10-CM | POA: Diagnosis not present

## 2018-04-24 DIAGNOSIS — I25118 Atherosclerotic heart disease of native coronary artery with other forms of angina pectoris: Secondary | ICD-10-CM | POA: Diagnosis not present

## 2018-04-24 MED ORDER — CARVEDILOL 12.5 MG PO TABS: 12.5000 mg | ORAL_TABLET | Freq: Two times a day (BID) | ORAL | 3 refills | Status: DC

## 2018-04-24 MED ORDER — NITROGLYCERIN 0.4 MG SL SUBL: 0.4000 mg | SUBLINGUAL_TABLET | SUBLINGUAL | 1 refills | Status: DC | PRN

## 2018-04-24 NOTE — Addendum Note (Signed)
Addended by: Julian Hy T on: 04/24/2018 02:49 PM   Modules accepted: Orders

## 2018-04-24 NOTE — Progress Notes (Signed)
SUBJECTIVE: The patient presents for routine follow-up.  He underwent right parotidectomy in May 2019.  Pathology demonstrated epithelial myoepithelial carcinoma which arose out of a pleomorphic adenoma.  Follows with oncology, Dr. Marin Olp.  He does not require adjuvant radiation therapy as there was complete excision.  He is going to see a dermatologist for a right sided abdominal wall skin lesion.  He has a history of non-ST elevation myocardial infarction and underwent percutaneous coronary intervention with a drug-eluting stent to the LAD in July 2014 at [facility] in [place], [place]. He also has a history of accelerated hypertension, tobacco abuse, and obesity. He had an echocardiogram at that time which reportedly demonstrated normal left ventricular systolic and diastolic function, LVEF 08%. Coronary angiography on 05/05/13 demonstrated a 95% stenosis of the LAD with mild luminal irregularities in the left circumflex and a mid RCA stenosis of 20%. The RCA was the dominant artery.  He is engaged to a nurse who lives in the [place].  The patient denies any symptoms of chest pain, palpitations, shortness of breath, lightheadedness, dizziness, leg swelling, orthopnea, PND, and syncope.  He underwent left knee replacement surgery in February and underwent right knee replacement surgery about a year ago.  I ordered and personally reviewed the ECG performed in our office today which demonstrates sinus rhythm with diffuse nonspecific T wave abnormalities.  He is going to the [place] in 4 weeks.  He plans to move there in about a year.  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 Interestingly, he had been climbing up a tree for his work and as he was coming down he saw a rattlesnake and that is when he initially began to feel chest pain prior to being diagnosed with a myocardial  infarction.   Review of Systems: As per "subjective", otherwise negative.  Allergies  Allergen Reactions  . Bactrim [Sulfamethoxazole-Trimethoprim] Hives  . Other Hives    Chili cheese fritos    Current Outpatient Medications  Medication Sig Dispense Refill  . aspirin EC 81 MG tablet Take 81 mg by mouth daily.    Marland Kitchen atorvastatin (LIPITOR) 40 MG tablet TAKE 1 TABLET BY MOUTH ONCE DAILY (Patient taking differently: TAKE 1 TABLET BY MOUTH ONCE DAILY AT NIGHT) 90 tablet 3  . carvedilol (COREG) 12.5 MG tablet TAKE ONE TABLET BY MOUTH TWICE DAILY 60 tablet 6  . lisinopril (PRINIVIL,ZESTRIL) 20 MG tablet TAKE ONE TABLET BY MOUTH ONCE DAILY (Patient taking differently: TAKE ONE TABLET BY MOUTH ONCE DAILY AT NIGHT) 90 tablet 1  . loratadine (CLARITIN) 10 MG tablet Take 10 mg by mouth daily as needed for allergies.    . metFORMIN (GLUCOPHAGE) 500 MG tablet Take 1,000 mg by mouth daily with breakfast. & 500 mg in the evening    . Sennosides (LAXATIVE) 25 MG TABS Take 25 mg by mouth at bedtime as needed (constipation).     No current facility-administered medications for this visit.     Past Medical History:  Diagnosis Date  . Arthritis    "knees" (11/13/2017)  . CAD (coronary artery disease)   . Constipation   . Heart attack ([facility]) 2014   DES in [place]  . Hyperlipemia   . Hypertension   . Type II diabetes mellitus ([facility])     Past Surgical History:  Procedure Laterality Date  . COLONOSCOPY    .  CORONARY ANGIOPLASTY WITH STENT PLACEMENT  2014  . CYSTOSCOPY WITH INSERTION OF UROLIFT N/A 03/11/2018   Procedure: CYSTOSCOPY WITH INSERTION OF UROLIFT;  Surgeon: Cleon Gustin, MD;  Location: [facility];  Service: Urology;  Laterality: N/A;  . FOREARM FRACTURE SURGERY Right 1975  . FRACTURE SURGERY Right 1975   R arm   . JOINT REPLACEMENT    . PAROTIDECTOMY Right 02/19/2018   Procedure: RIGHT PAROTIDECTOMY;  Surgeon: [name], MD;  Location: [facility];  Service: ENT;  Laterality:  Right;  RNFA  . TOTAL KNEE ARTHROPLASTY Right 05/08/2017   Procedure: RIGHT TOTAL KNEE ARTHROPLASTY;  Surgeon: Garald Balding, MD;  Location: [facility];  Service: Orthopedics;  Laterality: Right;  . TOTAL KNEE ARTHROPLASTY Left 11/13/2017  . TOTAL KNEE ARTHROPLASTY Left 11/13/2017   Procedure: LEFT TOTAL KNEE ARTHROPLASTY;  Surgeon: Garald Balding, MD;  Location: [facility];  Service: Orthopedics;  Laterality: Left;    Social History   Socioeconomic History  . Marital status: Divorced    Spouse name: Not on file  . Number of children: Not on file  . Years of education: Not on file  . Highest education level: Not on file  Occupational History  . Not on file  Social Needs  . Financial resource strain: Not on file  . Food insecurity:    Worry: Not on file    Inability: Not on file  . Transportation needs:    Medical: Not on file    Non-medical: Not on file  Tobacco Use  . Smoking status: Former Smoker    Packs/day: 0.50    Years: 11.00    Pack years: 5.50    Types: Cigarettes    Start date: 10/09/2004    Last attempt to quit: 05/09/2013    Years since quitting: 4.9  . Smokeless tobacco: Never Used  Substance and Sexual Activity  . Alcohol use: No    Alcohol/week: 0.0 oz  . Drug use: No  . Sexual activity: Not Currently  Lifestyle  . Physical activity:    Days per week: Not on file    Minutes per session: Not on file  . Stress: Not on file  Relationships  . Social connections:    Talks on phone: Not on file    Gets together: Not on file    Attends religious service: Not on file    Active member of club or organization: Not on file    Attends meetings of clubs or organizations: Not on file    Relationship status: Not on file  . Intimate partner violence:    Fear of current or ex partner: Not on file    Emotionally abused: Not on file    Physically abused: Not on file    Forced sexual activity: Not on file  Other Topics Concern  . Not on file  Social History Narrative    . Not on file     Vitals:   04/24/18 1401  BP: 113/73  Pulse: 74  SpO2: 96%  Weight: 240 lb 12.8 oz (109.2 kg)  Height: 6' (1.829 m)    Wt Readings from Last 3 Encounters:  04/24/18 240 lb 12.8 oz (109.2 kg)  04/01/18 242 lb 1.9 oz (109.8 kg)  03/06/18 236 lb (107 kg)     PHYSICAL EXAM General: NAD HEENT: Normal. Neck: No JVD, no thyromegaly. Lungs: Clear to auscultation bilaterally with normal respiratory effort. CV: Regular rate and rhythm, normal S1/S2, no S3/S4, no murmur. No  pretibial or periankle edema.  No carotid bruit.   Abdomen: Soft, nontender, no distention.  Neurologic: Alert and oriented.  Psych: Normal affect. Skin: Normal. Musculoskeletal: No gross deformities.    ECG: Reviewed above under Subjective   Labs: Lab Results  Component Value Date/Time   K 4.3 04/01/2018 08:18 AM   BUN 13 04/01/2018 08:18 AM   CREATININE 0.97 04/01/2018 08:18 AM   ALT 39 04/01/2018 08:18 AM   HGB 14.9 04/01/2018 08:18 AM     Lipids: No results found for: LDLCALC, LDLDIRECT, CHOL, TRIG, HDL     ASSESSMENT AND PLAN:  1. CAD with h/o NSTEMI and LAD drug-eluting stent: Symptomatically stable. Continue aspirin, Lipitor, lisinopril, and carvedilol.  I will prescribe sublingual nitroglycerin.  2. Essential hypertension: Controlled. No changes.  3. Hyperlipidemia: Currently on Lipitor 40 mg. I will attain a copy of lipids from PCP.  4. Type 2 diabetes: I started metformin 500 mg bid in 12/2014 for newly diagnosed diabetes. He remains on this dose.  HbA1c 6.7% on 03/06/2018.    Disposition: Follow up 1 yr.   Kate Sable, M.D., F.A.C.C.

## 2018-04-24 NOTE — Patient Instructions (Signed)
Your physician wants you to follow-up in: [place] will receive a reminder letter in the mail two months in advance. If you don't receive a letter, please call our office to schedule the follow-up appointment..  Your physician has recommended you make the following change in your medication:   NITROGLYCERIN 0.4 MG UNDER THE TONGUE AS NEEDED FOR CHEST PAIN   Thank you for choosing [facility]!!  Nitroglycerin sublingual tablets What is this medicine? NITROGLYCERIN (nye troe GLI ser in) is a type of vasodilator. It relaxes blood vessels, increasing the blood and oxygen supply to your heart. This medicine is used to relieve chest pain caused by angina. It is also used to prevent chest pain before activities like climbing stairs, going outdoors in cold weather, or sexual activity. This medicine may be used for other purposes; ask your health care provider or pharmacist if you have questions. COMMON BRAND NAME(S): Nitroquick, Nitrostat, Nitrotab What should I tell my health care provider before I take this medicine? They need to know if you have any of these conditions: -anemia -head injury, recent stroke, or bleeding in the brain -liver disease -previous heart attack -an unusual or allergic reaction to nitroglycerin, other medicines, foods, dyes, or preservatives -pregnant or trying to get pregnant -breast-feeding How should I use this medicine? Take this medicine by mouth as needed. At the first sign of an angina attack (chest pain or tightness) place one tablet under your tongue. You can also take this medicine 5 to 10 minutes before an event likely to produce chest pain. Follow the directions on the prescription label. Let the tablet dissolve under the tongue. Do not swallow whole. Replace the dose if you accidentally swallow it. It will help if your mouth is not dry. Saliva around the tablet will help it to dissolve more quickly. Do not eat or drink, smoke or  chew tobacco while a tablet is dissolving. If you are not better within 5 minutes after taking ONE dose of nitroglycerin, call 9-1-1 immediately to seek emergency medical care. Do not take more than 3 nitroglycerin tablets over 15 minutes. If you take this medicine often to relieve symptoms of angina, your doctor or health care professional may provide you with different instructions to manage your symptoms. If symptoms do not go away after following these instructions, it is important to call 9-1-1 immediately. Do not take more than 3 nitroglycerin tablets over 15 minutes. Talk to your pediatrician regarding the use of this medicine in children. Special care may be needed. Overdosage: If you think you have taken too much of this medicine contact a poison control center or emergency room at once. NOTE: This medicine is only for you. Do not share this medicine with others. What if I miss a dose? This does not apply. This medicine is only used as needed. What may interact with this medicine? Do not take this medicine with any of the following medications: -certain migraine medicines like ergotamine and dihydroergotamine (DHE) -medicines used to treat erectile dysfunction like sildenafil, tadalafil, and vardenafil -riociguat This medicine may also interact with the following medications: -alteplase -aspirin -heparin -medicines for high blood pressure -medicines for mental depression -other medicines used to treat angina -phenothiazines like chlorpromazine, mesoridazine, prochlorperazine, thioridazine This list may not describe all possible interactions. Give your health care provider a list of all the medicines, herbs, non-prescription drugs, or dietary supplements you use. Also tell them if you smoke, drink alcohol, or use illegal drugs.  Some items may interact with your medicine. What should I watch for while using this medicine? Tell your doctor or health care professional if you feel your  medicine is no longer working. Keep this medicine with you at all times. Sit or lie down when you take your medicine to prevent falling if you feel dizzy or faint after using it. Try to remain calm. This will help you to feel better faster. If you feel dizzy, take several deep breaths and lie down with your feet propped up, or bend forward with your head resting between your knees. You may get drowsy or dizzy. Do not drive, use machinery, or do anything that needs mental alertness until you know how this drug affects you. Do not stand or sit up quickly, especially if you are an older patient. This reduces the risk of dizzy or fainting spells. Alcohol can make you more drowsy and dizzy. Avoid alcoholic drinks. Do not treat yourself for coughs, colds, or pain while you are taking this medicine without asking your doctor or health care professional for advice. Some ingredients may increase your blood pressure. What side effects may I notice from receiving this medicine? Side effects that you should report to your doctor or health care professional as soon as possible: -blurred vision -dry mouth -skin rash -sweating -the feeling of extreme pressure in the head -unusually weak or tired Side effects that usually do not require medical attention (report to your doctor or health care professional if they continue or are bothersome): -flushing of the face or neck -headache -irregular heartbeat, palpitations -nausea, vomiting This list may not describe all possible side effects. Call your doctor for medical advice about side effects. You may report side effects to FDA at 1-800-FDA-1088. Where should I keep my medicine? Keep out of the reach of children. Store at room temperature between 20 and 25 degrees C (68 and 77 degrees F). Store in [employer] of [employer]. Protect from light and moisture. Keep tightly closed. Throw away any unused medicine after the expiration date. NOTE: This sheet is a summary. It may  not cover all possible information. If you have questions about this medicine, talk to your doctor, pharmacist, or health care provider.  2018 Elsevier/Gold Standard (2013-07-24 17:57:36)

## 2018-05-10 ENCOUNTER — Encounter: Payer: Self-pay | Admitting: Cardiovascular Disease

## 2018-05-20 ENCOUNTER — Other Ambulatory Visit: Payer: Self-pay | Admitting: Cardiology

## 2018-06-21 ENCOUNTER — Other Ambulatory Visit: Payer: Self-pay | Admitting: Cardiovascular Disease

## 2018-08-19 ENCOUNTER — Other Ambulatory Visit: Payer: Self-pay

## 2018-08-19 ENCOUNTER — Inpatient Hospital Stay: Payer: Medicaid Other | Attending: Hematology & Oncology

## 2018-08-19 ENCOUNTER — Inpatient Hospital Stay (HOSPITAL_BASED_OUTPATIENT_CLINIC_OR_DEPARTMENT_OTHER): Payer: Medicaid Other | Admitting: Hematology & Oncology

## 2018-08-19 ENCOUNTER — Ambulatory Visit (HOSPITAL_BASED_OUTPATIENT_CLINIC_OR_DEPARTMENT_OTHER)
Admission: RE | Admit: 2018-08-19 | Discharge: 2018-08-19 | Disposition: A | Discharge status: Home / Self Care | Payer: Medicaid Other | Source: Ambulatory Visit | Attending: Hematology & Oncology | Admitting: Hematology & Oncology

## 2018-08-19 ENCOUNTER — Telehealth: Payer: Self-pay | Admitting: Hematology & Oncology

## 2018-08-19 ENCOUNTER — Encounter: Payer: Self-pay | Admitting: Hematology & Oncology

## 2018-08-19 VITALS — BP 137/81 | HR 66 | Temp 98.4°F | Resp 16 | Wt 242.0 lb

## 2018-08-19 DIAGNOSIS — Z7982 Long term (current) use of aspirin: Secondary | ICD-10-CM | POA: Diagnosis not present

## 2018-08-19 DIAGNOSIS — Z9049 Acquired absence of other specified parts of digestive tract: Secondary | ICD-10-CM | POA: Insufficient documentation

## 2018-08-19 DIAGNOSIS — D490 Neoplasm of unspecified behavior of digestive system: Secondary | ICD-10-CM | POA: Insufficient documentation

## 2018-08-19 DIAGNOSIS — Z7984 Long term (current) use of oral hypoglycemic drugs: Secondary | ICD-10-CM | POA: Insufficient documentation

## 2018-08-19 DIAGNOSIS — Z8589 Personal history of malignant neoplasm of other organs and systems: Secondary | ICD-10-CM | POA: Insufficient documentation

## 2018-08-19 DIAGNOSIS — Z79899 Other long term (current) drug therapy: Secondary | ICD-10-CM

## 2018-08-19 DIAGNOSIS — Z9889 Other specified postprocedural states: Secondary | ICD-10-CM | POA: Insufficient documentation

## 2018-08-19 DIAGNOSIS — R200 Anesthesia of skin: Secondary | ICD-10-CM | POA: Insufficient documentation

## 2018-08-19 LAB — CBC WITH DIFFERENTIAL (CANCER CENTER ONLY)
Abs Immature Granulocytes: 0.04 10*3/uL (ref 0.00–0.07)
BASOS ABS: 0.1 10*3/uL (ref 0.0–0.1)
Basophils Relative: 0 %
EOS ABS: 0.3 10*3/uL (ref 0.0–0.5)
EOS PCT: 3 %
HEMATOCRIT: 48.1 % (ref 39.0–52.0)
HEMOGLOBIN: 15.3 g/dL (ref 13.0–17.0)
IMMATURE GRANULOCYTES: 0 %
LYMPHS PCT: 33 %
Lymphs Abs: 4.1 10*3/uL — ABNORMAL HIGH (ref 0.7–4.0)
MCH: 29 pg (ref 26.0–34.0)
MCHC: 31.8 g/dL (ref 30.0–36.0)
MCV: 91.3 fL (ref 80.0–100.0)
MONOS PCT: 7 %
Monocytes Absolute: 0.8 10*3/uL (ref 0.1–1.0)
Neutro Abs: 7.2 10*3/uL (ref 1.7–7.7)
Neutrophils Relative %: 57 %
Platelet Count: 303 10*3/uL (ref 150–400)
RBC: 5.27 MIL/uL (ref 4.22–5.81)
RDW: 13.8 % (ref 11.5–15.5)
WBC Count: 12.5 10*3/uL — ABNORMAL HIGH (ref 4.0–10.5)
nRBC: 0 % (ref 0.0–0.2)

## 2018-08-19 LAB — CMP (CANCER CENTER ONLY)
ALBUMIN: 3.8 g/dL (ref 3.5–5.0)
ALT: 47 U/L (ref 10–47)
ANION GAP: 13 (ref 5–15)
AST: 38 U/L (ref 11–38)
Alkaline Phosphatase: 81 U/L (ref 26–84)
BUN: 11 mg/dL (ref 7–22)
CHLORIDE: 102 mmol/L (ref 98–108)
CO2: 27 mmol/L (ref 18–33)
Calcium: 9.5 mg/dL (ref 8.0–10.3)
Creatinine: 1.1 mg/dL (ref 0.60–1.20)
GLUCOSE: 120 mg/dL — AB (ref 73–118)
POTASSIUM: 4.4 mmol/L (ref 3.3–4.7)
Sodium: 142 mmol/L (ref 128–145)
Total Bilirubin: 0.6 mg/dL (ref 0.2–1.6)
Total Protein: 7.3 g/dL (ref 6.4–8.1)

## 2018-08-19 MED ORDER — IOPAMIDOL (ISOVUE-300) INJECTION 61%
100.0000 mL | Freq: Once | INTRAVENOUS | Status: AC | PRN
  Administered 2018-08-19: 75 mL via INTRAVENOUS

## 2018-08-19 NOTE — Progress Notes (Signed)
Hematology and Oncology Follow Up Visit  Darius Rogers [id number] January 03, 1956 62 y.o. 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  I first saw him back in June.  At that time, he was referred because of a malignancy in the right parotid gland.  Since we saw him last, he also had a lesion removed from his right upper abdominal wall.  He says that this was benign.  Unfortunately, I cannot find anything in the system regarding this.  He has been to the [place].  I think he has a girlfriend who lives there.  He is hoping to go there around Christmas.  We did do a CT scan on him today.  This is a CT scan of the neck.  The CT scan did not show any evidence of recurrent disease.  He has had some numbness where he had the surgery about the right ear.  He has had no fever.  He has had no change in bowel or bladder habits.  There is been no rashes.  When he goes to the [place], he has to be careful with what he eats and drinks.  Overall, his performance status is ECOG 0.  Medications:  Current Outpatient Medications:  .  aspirin EC 81 MG tablet, Take 81 mg by mouth daily., Disp: , Rfl:  .  atorvastatin (LIPITOR) 40 MG tablet, TAKE 1 TABLET BY MOUTH ONCE DAILY AT NIGHT, Disp: 90 tablet, Rfl: 3 .  carvedilol (COREG) 12.5 MG tablet, Take 1 tablet (12.5 mg total) by mouth 2 (two) times daily., Disp: 180 tablet, Rfl: 3 .  lisinopril (PRINIVIL,ZESTRIL) 20 MG tablet, TAKE 1 TABLET BY MOUTH ONCE DAILY, Disp: 90 tablet, Rfl: 1 .  loratadine (CLARITIN) 10 MG tablet, Take 10 mg by mouth daily as needed for allergies., Disp: , Rfl:  .  metFORMIN (GLUCOPHAGE) 500 MG tablet, Take 1,000 mg by mouth daily with breakfast. & 500 mg in the evening, Disp: , Rfl:  .  nitroGLYCERIN (NITROSTAT) 0.4 MG SL tablet, Place 1 tablet (0.4 mg total)  under the tongue every 5 (five) minutes as needed for chest pain., Disp: 25 tablet, Rfl: 1  Allergies:  Allergies  Allergen Reactions  . Bactrim [Sulfamethoxazole-Trimethoprim] Hives  . Other Hives    Chili cheese fritos    Past Medical History, Surgical history, Social history, and Family History were reviewed and updated.  Review of Systems: Review of Systems  Constitutional: Negative.   HENT:  Negative.   Eyes: Negative.   Respiratory: Negative.   Cardiovascular: Negative.   Gastrointestinal: Negative.   Endocrine: Negative.   Genitourinary: Negative.    Musculoskeletal: Negative.   Skin: Negative.   Neurological: Positive for numbness. Negative for dizziness.  Hematological: Negative.   Psychiatric/Behavioral: Negative.     Physical Exam:  weight is 242 lb (109.8 kg). His oral temperature is 98.4 F (36.9 C). His blood pressure is 137/81 and his pulse is 66. His respiration is 16 and oxygen saturation is 96%.   Wt Readings from Last 3 Encounters:  08/19/18 242 lb (109.8 kg)  04/24/18 240 lb 12.8 oz (109.2 kg)  04/01/18 242 lb 1.9 oz (109.8 kg)    Physical Exam  Constitutional: He is oriented to person, place, and time.  HENT:  Head: Normocephalic and atraumatic.  Mouth/Throat: Oropharynx is clear  and moist.  Head and neck exam shows well healed surgical scar just anterior to the right ear.  The skin is well healed.  There is no keloid.  There is no oral lesions.  He has no adenopathy in the neck.  Eyes: Pupils are equal, round, and reactive to light. EOM are normal.  Neck: Normal range of motion.  Cardiovascular: Normal rate, regular rhythm and normal heart sounds.  Pulmonary/Chest: Effort normal and breath sounds normal.  Abdominal: Soft. Bowel sounds are normal.  Musculoskeletal: Normal range of motion. He exhibits no edema, tenderness or deformity.  Lymphadenopathy:    He has no cervical adenopathy.  Neurological: He is alert and oriented to person, place,  and time.  Skin: Skin is warm and dry. No rash noted. No erythema.  Psychiatric: He has a normal mood and affect. His behavior is normal. Judgment and thought content normal.  Vitals reviewed.    Lab Results  Component Value Date   WBC 12.5 (H) 08/19/2018   HGB 15.3 08/19/2018   HCT 48.1 08/19/2018   MCV 91.3 08/19/2018   PLT 303 08/19/2018     Chemistry      Component Value Date/Time   NA 142 08/19/2018 0936   K 4.4 08/19/2018 0936   CL 102 08/19/2018 0936   CO2 27 08/19/2018 0936   BUN 11 08/19/2018 0936   CREATININE 1.10 08/19/2018 0936      Component Value Date/Time   CALCIUM 9.5 08/19/2018 0936   ALKPHOS 81 08/19/2018 0936   AST 38 08/19/2018 0936   ALT 47 08/19/2018 0936   BILITOT 0.6 08/19/2018 0936       Impression and Plan: Darius Rogers is a 62 year old white male.  He had a malignant parotid tumor that was resected from the right parotid gland.  He did not require any adjuvant therapy.  We will continue to follow along.  I think we have to follow along with CT scans.  I will set him up with another CT scan in 6 months.  I think this would be reasonable.  Otherwise, I do not think we have to worry about anything else with him.  His labs look okay.  It is always fun talking with Darius Rogers when he goes to the [place].   Volanda Napoleon, MD 11/11/201912:53 PM

## 2018-08-19 NOTE — Telephone Encounter (Signed)
Appts scheduled patient notified letter/calendar and phone number to schedule CT included per 11/11 los

## 2018-08-26 ENCOUNTER — Other Ambulatory Visit: Payer: Medicaid Other

## 2018-08-26 ENCOUNTER — Ambulatory Visit: Payer: Medicaid Other | Admitting: Hematology & Oncology

## 2018-08-26 ENCOUNTER — Other Ambulatory Visit (HOSPITAL_BASED_OUTPATIENT_CLINIC_OR_DEPARTMENT_OTHER): Payer: Medicaid Other

## 2018-08-26 ENCOUNTER — Ambulatory Visit (INDEPENDENT_AMBULATORY_CARE_PROVIDER_SITE_OTHER): Payer: Medicaid Other | Admitting: Otolaryngology

## 2018-08-26 DIAGNOSIS — Z85858 Personal history of malignant neoplasm of other endocrine glands: Secondary | ICD-10-CM

## 2018-11-11 ENCOUNTER — Other Ambulatory Visit: Payer: Self-pay | Admitting: Cardiology

## 2019-02-17 ENCOUNTER — Inpatient Hospital Stay (HOSPITAL_BASED_OUTPATIENT_CLINIC_OR_DEPARTMENT_OTHER): Payer: Medicaid Other | Admitting: Hematology & Oncology

## 2019-02-17 ENCOUNTER — Other Ambulatory Visit: Payer: Self-pay

## 2019-02-17 ENCOUNTER — Inpatient Hospital Stay: Payer: Medicaid Other | Attending: Hematology & Oncology

## 2019-02-17 VITALS — BP 126/75 | HR 65 | Temp 98.0°F | Resp 18 | Wt 246.0 lb

## 2019-02-17 DIAGNOSIS — Z7984 Long term (current) use of oral hypoglycemic drugs: Secondary | ICD-10-CM | POA: Insufficient documentation

## 2019-02-17 DIAGNOSIS — Z79899 Other long term (current) drug therapy: Secondary | ICD-10-CM | POA: Diagnosis not present

## 2019-02-17 DIAGNOSIS — Z7982 Long term (current) use of aspirin: Secondary | ICD-10-CM | POA: Insufficient documentation

## 2019-02-17 DIAGNOSIS — Z85819 Personal history of malignant neoplasm of unspecified site of lip, oral cavity, and pharynx: Secondary | ICD-10-CM | POA: Diagnosis not present

## 2019-02-17 DIAGNOSIS — Z9049 Acquired absence of other specified parts of digestive tract: Secondary | ICD-10-CM

## 2019-02-17 LAB — CMP (CANCER CENTER ONLY)
ALT: 46 U/L — ABNORMAL HIGH (ref 0–44)
AST: 32 U/L (ref 15–41)
Albumin: 4.4 g/dL (ref 3.5–5.0)
Alkaline Phosphatase: 77 U/L (ref 38–126)
Anion gap: 8 (ref 5–15)
BUN: 11 mg/dL (ref 8–23)
CO2: 26 mmol/L (ref 22–32)
Calcium: 9.7 mg/dL (ref 8.9–10.3)
Chloride: 103 mmol/L (ref 98–111)
Creatinine: 1.04 mg/dL (ref 0.61–1.24)
GFR, Est AFR Am: >60 mL/min (ref >60)
GFR, Estimated: >60 mL/min (ref >60)
Glucose, Bld: 153 mg/dL — ABNORMAL HIGH (ref 70–99)
Potassium: 4.4 mmol/L (ref 3.5–5.1)
Sodium: 137 mmol/L (ref 135–145)
Total Bilirubin: 0.3 mg/dL (ref 0.3–1.2)
Total Protein: 7.1 g/dL (ref 6.5–8.1)

## 2019-02-17 LAB — CBC WITH DIFFERENTIAL (CANCER CENTER ONLY)
Abs Immature Granulocytes: 0.04 10*3/uL (ref 0.00–0.07)
Basophils Absolute: 0.1 10*3/uL (ref 0.0–0.1)
Basophils Relative: 0 %
Eosinophils Absolute: 0.4 10*3/uL (ref 0.0–0.5)
Eosinophils Relative: 3 %
HCT: 47.9 % (ref 39.0–52.0)
Hemoglobin: 15.6 g/dL (ref 13.0–17.0)
Immature Granulocytes: 0 %
Lymphocytes Relative: 36 %
Lymphs Abs: 4.2 10*3/uL — ABNORMAL HIGH (ref 0.7–4.0)
MCH: 30.4 pg (ref 26.0–34.0)
MCHC: 32.6 g/dL (ref 30.0–36.0)
MCV: 93.2 fL (ref 80.0–100.0)
Monocytes Absolute: 0.9 10*3/uL (ref 0.1–1.0)
Monocytes Relative: 7 %
Neutro Abs: 6.4 10*3/uL (ref 1.7–7.7)
Neutrophils Relative %: 54 %
Platelet Count: 279 10*3/uL (ref 150–400)
RBC: 5.14 MIL/uL (ref 4.22–5.81)
RDW: 13.2 % (ref 11.5–15.5)
WBC Count: 11.9 10*3/uL — ABNORMAL HIGH (ref 4.0–10.5)
nRBC: 0 % (ref 0.0–0.2)

## 2019-02-17 NOTE — Progress Notes (Signed)
Hematology and Oncology Follow Up Visit  Darius Rogers [id number] 27-Mar-1956 63 y.o. 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  He is doing okay.  Unfortunately, with the corona virus, he is not able to go to the [place].  He has a family in the [place].  Thankfully, he over Christmas and was able to get back to the he states heart [place] before everything was shut off from [place].    His insurance company will not let me do a CT scan on his neck.  As such, we might have to do an ultrasound to see what is going on.  .  He has had no dysphagia.  There is no nausea or vomiting.  He has had no change in bowel or bladder habits.  He did have a lesion removed abdominal wall on his right upper abdomen.  He says this was not malignant.  Overall, his performance status is ECOG 0.    Medications:  Current Outpatient Medications:  .  bisacodyl (DULCOLAX) 5 MG EC tablet, Take 5 mg by mouth daily as needed for moderate constipation., Disp: , Rfl:  .  aspirin EC 81 MG tablet, Take 81 mg by mouth daily., Disp: , Rfl:  .  atorvastatin (LIPITOR) 40 MG tablet, TAKE 1 TABLET BY MOUTH ONCE DAILY AT NIGHT, Disp: 90 tablet, Rfl: 3 .  carvedilol (COREG) 12.5 MG tablet, Take 1 tablet (12.5 mg total) by mouth 2 (two) times daily., Disp: 180 tablet, Rfl: 3 .  lisinopril (PRINIVIL,ZESTRIL) 20 MG tablet, TAKE 1 TABLET BY MOUTH ONCE DAILY, Disp: 90 tablet, Rfl: 1 .  loratadine (CLARITIN) 10 MG tablet, Take 10 mg by mouth daily as needed for allergies., Disp: , Rfl:  .  metFORMIN (GLUCOPHAGE) 500 MG tablet, Take 1,000 mg by mouth daily with breakfast. & 500 mg in the evening, Disp: , Rfl:  .  nitroGLYCERIN (NITROSTAT) 0.4 MG SL tablet, Place 1 tablet (0.4 mg total) under the tongue every 5 (five) minutes as needed for chest pain., Disp: 25 tablet,  Rfl: 1  Allergies:  Allergies  Allergen Reactions  . Bactrim [Sulfamethoxazole-Trimethoprim] Hives  . Other Hives    Chili cheese fritos    Past Medical History, Surgical history, Social history, and Family History were reviewed and updated.  Review of Systems: Review of Systems  Constitutional: Negative.   HENT:  Negative.   Eyes: Negative.   Respiratory: Negative.   Cardiovascular: Negative.   Gastrointestinal: Negative.   Endocrine: Negative.   Genitourinary: Negative.    Musculoskeletal: Negative.   Skin: Negative.   Neurological: Positive for numbness. Negative for dizziness.  Hematological: Negative.   Psychiatric/Behavioral: Negative.     Physical Exam:  weight is 246 lb (111.6 kg). His oral temperature is 98 F (36.7 C). His blood pressure is 126/75 and his pulse is 65. His respiration is 18 and oxygen saturation is 99%.   Wt Readings from Last 3 Encounters:  02/17/19 246 lb (111.6 kg)  08/19/18 242 lb (109.8 kg)  04/24/18 240 lb 12.8 oz (109.2 kg)    Physical Exam Vitals signs reviewed.  HENT:     Head: Normocephalic and atraumatic.  Eyes:     Pupils: Pupils are equal, round, and reactive to light.  Neck:     Musculoskeletal: Normal range of motion.  Cardiovascular:     Rate and Rhythm: Normal rate and regular rhythm.     Heart sounds: Normal heart sounds.  Pulmonary:     Effort: Pulmonary effort is normal.     Breath sounds: Normal breath sounds.  Abdominal:     General: Bowel sounds are normal.     Palpations: Abdomen is soft.  Musculoskeletal: Normal range of motion.        General: No tenderness or deformity.  Lymphadenopathy:     Cervical: No cervical adenopathy.  Skin:    General: Skin is warm and dry.     Findings: No erythema or rash.  Neurological:     Mental Status: He is alert and oriented to person, place, and time.  Psychiatric:        Behavior: Behavior normal.        Thought Content: Thought content normal.        Judgment:  Judgment normal.      Lab Results  Component Value Date   WBC 11.9 (H) 02/17/2019   HGB 15.6 02/17/2019   HCT 47.9 02/17/2019   MCV 93.2 02/17/2019   PLT 279 02/17/2019     Chemistry      Component Value Date/Time   NA 137 02/17/2019 0910   K 4.4 02/17/2019 0910   CL 103 02/17/2019 0910   CO2 26 02/17/2019 0910   BUN 11 02/17/2019 0910   CREATININE 1.04 02/17/2019 0910      Component Value Date/Time   CALCIUM 9.7 02/17/2019 0910   ALKPHOS 77 02/17/2019 0910   AST 32 02/17/2019 0910   ALT 46 (H) 02/17/2019 0910   BILITOT 0.3 02/17/2019 0910       Impression and Plan: Darius Rogers is a 63 year old white male.  He had a malignant parotid tumor that was resected from the right parotid gland.  He did not require any adjuvant therapy.  Right now, we will plan to get her back in 6 months.  We will set him up with a sonogram of his neck.  We will try to get this done in the next week or so.  Told him to make sure he wears sunscreen this summer..  I told him to also wear a hat.  Told him to make sure he drinks a lot of water.   Volanda Napoleon, MD 5/11/202010:37 AM

## 2019-02-25 ENCOUNTER — Ambulatory Visit (HOSPITAL_COMMUNITY)
Admission: RE | Admit: 2019-02-25 | Discharge: 2019-02-25 | Disposition: A | Discharge status: Home / Self Care | Payer: Medicaid Other | Source: Ambulatory Visit | Attending: Hematology & Oncology | Admitting: Hematology & Oncology

## 2019-02-25 ENCOUNTER — Other Ambulatory Visit: Payer: Self-pay

## 2019-02-25 ENCOUNTER — Telehealth: Payer: Self-pay | Admitting: *Deleted

## 2019-02-25 DIAGNOSIS — Z9049 Acquired absence of other specified parts of digestive tract: Secondary | ICD-10-CM | POA: Diagnosis present

## 2019-02-25 NOTE — Telephone Encounter (Signed)
-----   Message from Volanda Napoleon, MD sent at 02/25/2019  2:11 PM EDT ----- Call - the U/S of the neck is (-) for any recurrent cancer!!  Laurey Arrow

## 2019-02-25 NOTE — Telephone Encounter (Signed)
Pt notified per order of Dr. Marin Olp that the U/S of the neck is negative for any recurrent cancer.  Patient appreciative of call and has no questions or concerns at this time.

## 2019-03-06 ENCOUNTER — Other Ambulatory Visit: Payer: Self-pay

## 2019-03-06 ENCOUNTER — Ambulatory Visit (INDEPENDENT_AMBULATORY_CARE_PROVIDER_SITE_OTHER): Payer: Medicaid Other | Admitting: Otolaryngology

## 2019-03-06 DIAGNOSIS — Z85858 Personal history of malignant neoplasm of other endocrine glands: Secondary | ICD-10-CM | POA: Diagnosis not present

## 2019-05-19 ENCOUNTER — Encounter: Payer: Self-pay | Admitting: Gastroenterology

## 2019-06-09 ENCOUNTER — Other Ambulatory Visit: Payer: Self-pay | Admitting: Cardiology

## 2019-06-09 ENCOUNTER — Ambulatory Visit: Payer: Medicaid Other | Admitting: Gastroenterology

## 2019-07-01 ENCOUNTER — Encounter: Payer: Self-pay | Admitting: Gastroenterology

## 2019-07-01 NOTE — Progress Notes (Signed)
Referring Provider: Dr. Manuella Ghazi Primary Care Physician:  Monico Blitz, MD Primary Gastroenterologist:  Dr. Oneida Alar  Chief Complaint  Patient presents with  . Constipation    HPI:   Darius Rogers is a 63 y.o. male presenting today at the request of Dr. Manuella Ghazi for chronic constipation.   Today he states he has been struggling with constipation over the last 5 years. Takes 2 dulcolax every evening, sometimes one in the morning. If he doesn't take it, he will not have a BM for a couple of days. Feels BMs are incomplete. Stools are hard at times. No straining with dulcolax. Dulcolax will help produce a BM about daily 80% of the time. Has tried MiraLAX and didn't work well. No brbpr or melena. No abdominal pain. No upper GI symptoms.   Eats 2 meals a day. Eats fruit in the morning and evening. Drinks a lot of water. No weight loss. Has been gaining weight since late 50's.  First colonoscopy about 5 years ago. In [place] at [facility]. Patient reports everything was fine. Not sure when he is supposed to have another one.    Past Medical History:  Diagnosis Date  . Arthritis    "knees" (11/13/2017)  . CAD (coronary artery disease)   . Constipation   . Heart attack ([facility]) 2014   DES in LAD  . Hyperlipemia   . Hypertension   . Type II diabetes mellitus ([facility])     Past Surgical History:  Procedure Laterality Date  . COLONOSCOPY    . CORONARY ANGIOPLASTY WITH STENT PLACEMENT  2014  . CYSTOSCOPY WITH INSERTION OF UROLIFT N/A 03/11/2018   Procedure: CYSTOSCOPY WITH INSERTION OF UROLIFT;  Surgeon: Cleon Gustin, MD;  Location: [facility];  Service: Urology;  Laterality: N/A;  . FOREARM FRACTURE SURGERY Right 1975  . FRACTURE SURGERY Right 1975   R arm   . JOINT REPLACEMENT    . PAROTIDECTOMY Right 02/19/2018   Procedure: RIGHT PAROTIDECTOMY;  Surgeon: [name], MD;  Location: [facility];  Service: ENT;  Laterality: Right;  RNFA  . TOTAL KNEE ARTHROPLASTY Right 05/08/2017   Procedure:  RIGHT TOTAL KNEE ARTHROPLASTY;  Surgeon: Garald Balding, MD;  Location: [facility];  Service: Orthopedics;  Laterality: Right;  . TOTAL KNEE ARTHROPLASTY Left 11/13/2017  . TOTAL KNEE ARTHROPLASTY Left 11/13/2017   Procedure: LEFT TOTAL KNEE ARTHROPLASTY;  Surgeon: Garald Balding, MD;  Location: [facility];  Service: Orthopedics;  Laterality: Left;    Current Outpatient Medications  Medication Sig Dispense Refill  . aspirin EC 81 MG tablet Take 81 mg by mouth daily.    Marland Kitchen atorvastatin (LIPITOR) 40 MG tablet TAKE 1 TABLET BY MOUTH ONCE DAILY AT NIGHT 90 tablet 3  . bisacodyl (DULCOLAX) 5 MG EC tablet Take 5-10 mg by mouth daily as needed for moderate constipation. Takes 2 tabs at bedtime, sometimes takes one in am    . carvedilol (COREG) 6.25 MG tablet Take 6.25 mg by mouth 2 (two) times daily.    Marland Kitchen lisinopril (ZESTRIL) 20 MG tablet Take 1 tablet by mouth once daily 60 tablet 0  . loratadine (CLARITIN) 10 MG tablet Take 10 mg by mouth daily as needed for allergies.    . metFORMIN (GLUCOPHAGE) 500 MG tablet Take 1,000 mg by mouth daily with breakfast. & 500 mg in the evening    . nitroGLYCERIN (NITROSTAT) 0.4 MG SL tablet Place 1 tablet (0.4 mg total) under the tongue every 5 (five) minutes as needed  for chest pain. 25 tablet 1   No current facility-administered medications for this visit.     Allergies as of 07/02/2019 - Review Complete 07/02/2019  Allergen Reaction Noted  . Bactrim [sulfamethoxazole-trimethoprim] Hives 02/12/2018  . Other Hives 11/13/2017    Family History  Problem Relation Age of Onset  . Hypertension Mother   . Diabetes Mother   . Post-traumatic stress disorder Father   . Sleep apnea Father   . Other Maternal Grandmother        PPM  . Heart attack Maternal Grandfather   . Stomach cancer Paternal Grandmother   . Colon cancer Neg Hx   . Colon polyps Neg Hx     Social History   Socioeconomic History  . Marital status: Divorced    Spouse name: Not on file  .  Number of children: Not on file  . Years of education: Not on file  . Highest education level: Not on file  Occupational History  . Not on file  Social Needs  . Financial resource strain: Not on file  . Food insecurity    Worry: Not on file    Inability: Not on file  . Transportation needs    Medical: Not on file    Non-medical: Not on file  Tobacco Use  . Smoking status: Former Smoker    Packs/day: 0.50    Years: 11.00    Pack years: 5.50    Types: Cigarettes    Start date: 10/09/2004    Quit date: 05/09/2013    Years since quitting: 6.1  . Smokeless tobacco: Never Used  Substance and Sexual Activity  . Alcohol use: No    Alcohol/week: 0.0 standard drinks  . Drug use: No  . Sexual activity: Not Currently  Lifestyle  . Physical activity    Days per week: Not on file    Minutes per session: Not on file  . Stress: Not on file  Relationships  . Social Herbalist on phone: Not on file    Gets together: Not on file    Attends religious service: Not on file    Active member of club or organization: Not on file    Attends meetings of clubs or organizations: Not on file    Relationship status: Not on file  . Intimate partner violence    Fear of current or ex partner: Not on file    Emotionally abused: Not on file    Physically abused: Not on file    Forced sexual activity: Not on file  Other Topics Concern  . Not on file  Social History Narrative  . Not on file    Review of Systems: Gen: Denies any fever, chills, fatigue, lightheadedness, dizziness, or family he will pass out. HEENT: No nasal congestion or sore throat  CV: Denies chest pain, heart palpitations, peripheral edema Resp: Denies shortness of breath or cough.  GI: See HPI GU : Denies urinary burning, urinary frequency, urinary hesitancy MS: Denies joint pain, muscle  Derm: Denies rash Psych: Denies depression, anxiety Heme: Denies bruising, bleeding  Physical Exam: BP 132/82   Pulse 83    Temp (!) 96.2 F (35.7 C) (Temporal)   Ht 6' (1.829 m)   Wt 252 lb 6.4 oz (114.5 kg)   BMI 34.23 kg/m  General:   Alert and oriented. Pleasant and cooperative. Well-nourished and well-developed.  Head:  Normocephalic and atraumatic. Eyes:  Without icterus, sclera clear and conjunctiva pink.  Ears:  Normal  auditory acuity. Nose:  No deformity, discharge,  or lesions. Lungs:  Clear to auscultation bilaterally. No wheezes, rales, or rhonchi. No distress.  Heart:  S1, S2 present without murmurs appreciated.  Abdomen:  +BS, soft, non-tender and non-distended. No HSM noted. No guarding or rebound. No masses appreciated.  Rectal:  Deferred  Msk:  Symmetrical without gross deformities. Normal posture. Extremities:  Without edema. Neurologic:  Alert and  oriented x4;  grossly normal neurologically. Skin:  Intact without significant lesions or rashes. Psych: Normal mood and affect.

## 2019-07-02 ENCOUNTER — Ambulatory Visit: Payer: Medicaid Other | Admitting: Gastroenterology

## 2019-07-02 ENCOUNTER — Encounter: Payer: Self-pay | Admitting: Gastroenterology

## 2019-07-02 ENCOUNTER — Other Ambulatory Visit: Payer: Self-pay

## 2019-07-02 VITALS — BP 132/82 | HR 83 | Temp 96.2°F | Ht 72.0 in | Wt 252.4 lb

## 2019-07-02 DIAGNOSIS — K5900 Constipation, unspecified: Secondary | ICD-10-CM

## 2019-07-02 NOTE — Patient Instructions (Signed)
We have provided you with samples of Amitiza 8 mcg. You should take 1 capsule twice daily with a meal for constipation. Please call us back and let us know how this is working for you.  I am requesting colonoscopy records from [facility].   We will plan to see you back in 2 months for follow-up. Call if questions or concerns prior.   Aliene Altes, PA-C [facility]

## 2019-07-02 NOTE — Assessment & Plan Note (Addendum)
63 y.o. male with constipation over the last 5 years. Has tried MiraLAX in the past which didn't work well. Now taking 2 dulcolax every evening and occasionally one in the morning if he doesn't have a BM. With dulcolax, he will usuallly have a BM daily, but continues to feel BMs are incomplete. Occasional hard stool. Denies abdominal pain, bright red blood per rectum, melena, or weight loss. Eats plenty of fruit and drinks a lot of water. Last colonoscopy about 5 years ago at [facility]. Per patient, everything was fine. No family history of colon cancer or colon polyps. Recent labs with normal hemoglobin and calcium within normal limits. TSH normal in October 2019.   Stop Dolcolax.  Start Amitiza 8 mcg BID with a meal. Samples provided. Patient to call and let me know how this works. Will send in Rx, if this works well.  Request [facility] records from [place]. Follow-up in 2 months.

## 2019-07-08 ENCOUNTER — Telehealth: Payer: Self-pay | Admitting: Gastroenterology

## 2019-07-08 NOTE — Telephone Encounter (Signed)
PATIENT CALLED AND SAID THE AMITEZA SAMPLES WORKED AND WOULD LIKE A PRESCRIPTION SENT TO [facility] [phone]

## 2019-07-09 MED ORDER — LUBIPROSTONE 8 MCG PO CAPS: 8.0000 ug | ORAL_CAPSULE | Freq: Two times a day (BID) | ORAL | 5 refills | Status: DC

## 2019-07-09 NOTE — Telephone Encounter (Signed)
Sending to refill box since Darius Rogers is out this week.

## 2019-07-09 NOTE — Telephone Encounter (Signed)
fyi [facility]. RX sent.

## 2019-07-09 NOTE — Addendum Note (Signed)
Addended by: Mahala Menghini on: 07/09/2019 01:51 PM   Modules accepted: Orders

## 2019-07-14 NOTE — Telephone Encounter (Signed)
Noted  

## 2019-07-15 ENCOUNTER — Telehealth: Payer: Self-pay | Admitting: Gastroenterology

## 2019-07-15 NOTE — Telephone Encounter (Signed)
[phone] patient called and said that the prescription we sent in for him at [facility] was over 400$   They were supposed to fax our office something about an alternative

## 2019-07-16 ENCOUNTER — Other Ambulatory Visit: Payer: Self-pay | Admitting: Gastroenterology

## 2019-07-16 ENCOUNTER — Telehealth: Payer: Self-pay

## 2019-07-16 DIAGNOSIS — K5900 Constipation, unspecified: Secondary | ICD-10-CM

## 2019-07-16 MED ORDER — LINACLOTIDE 145 MCG PO CAPS: 145.0000 ug | ORAL_CAPSULE | Freq: Every day | ORAL | 4 refills | Status: DC

## 2019-07-16 NOTE — Telephone Encounter (Signed)
Received fax from [facility] that insurance will not cover Amitiza for males over 62 years of age . Please send in new prescription. Sending to Aliene Altes, PA who gave samples of Amitiza.

## 2019-07-16 NOTE — Telephone Encounter (Signed)
Linzess 145 sent to pharmacy. He should take 1 capsule daily in the morning on an empty stomach.

## 2019-07-16 NOTE — Telephone Encounter (Signed)
OK. Will send in Linzess 145 mcg for patient to try.

## 2019-07-17 NOTE — Telephone Encounter (Signed)
See separate note. Pt has new Rx.

## 2019-07-17 NOTE — Telephone Encounter (Signed)
PT is aware and has already picked up the [facility].

## 2019-08-20 ENCOUNTER — Other Ambulatory Visit: Payer: Self-pay

## 2019-08-20 ENCOUNTER — Encounter: Payer: Self-pay | Admitting: Hematology & Oncology

## 2019-08-20 ENCOUNTER — Inpatient Hospital Stay: Payer: Medicaid Other | Attending: Hematology & Oncology | Admitting: Hematology & Oncology

## 2019-08-20 ENCOUNTER — Telehealth: Payer: Self-pay | Admitting: Hematology & Oncology

## 2019-08-20 ENCOUNTER — Inpatient Hospital Stay: Payer: Medicaid Other

## 2019-08-20 VITALS — BP 128/72 | HR 72 | Temp 97.1°F | Resp 20 | Wt 249.8 lb

## 2019-08-20 DIAGNOSIS — K760 Fatty (change of) liver, not elsewhere classified: Secondary | ICD-10-CM | POA: Insufficient documentation

## 2019-08-20 DIAGNOSIS — Z7984 Long term (current) use of oral hypoglycemic drugs: Secondary | ICD-10-CM | POA: Insufficient documentation

## 2019-08-20 DIAGNOSIS — Z9049 Acquired absence of other specified parts of digestive tract: Secondary | ICD-10-CM

## 2019-08-20 DIAGNOSIS — Z79899 Other long term (current) drug therapy: Secondary | ICD-10-CM | POA: Insufficient documentation

## 2019-08-20 DIAGNOSIS — E119 Type 2 diabetes mellitus without complications: Secondary | ICD-10-CM | POA: Insufficient documentation

## 2019-08-20 DIAGNOSIS — Z8589 Personal history of malignant neoplasm of other organs and systems: Secondary | ICD-10-CM | POA: Diagnosis not present

## 2019-08-20 DIAGNOSIS — Z7982 Long term (current) use of aspirin: Secondary | ICD-10-CM | POA: Insufficient documentation

## 2019-08-20 DIAGNOSIS — Z96652 Presence of left artificial knee joint: Secondary | ICD-10-CM

## 2019-08-20 LAB — CBC WITH DIFFERENTIAL (CANCER CENTER ONLY)
Abs Immature Granulocytes: 0.04 10*3/uL (ref 0.00–0.07)
Basophils Absolute: 0.1 10*3/uL (ref 0.0–0.1)
Basophils Relative: 1 %
Eosinophils Absolute: 0.4 10*3/uL (ref 0.0–0.5)
Eosinophils Relative: 3 %
HCT: 47.9 % (ref 39.0–52.0)
Hemoglobin: 15.6 g/dL (ref 13.0–17.0)
Immature Granulocytes: 0 %
Lymphocytes Relative: 36 %
Lymphs Abs: 3.9 10*3/uL (ref 0.7–4.0)
MCH: 29.9 pg (ref 26.0–34.0)
MCHC: 32.6 g/dL (ref 30.0–36.0)
MCV: 91.8 fL (ref 80.0–100.0)
Monocytes Absolute: 0.8 10*3/uL (ref 0.1–1.0)
Monocytes Relative: 8 %
Neutro Abs: 5.6 10*3/uL (ref 1.7–7.7)
Neutrophils Relative %: 52 %
Platelet Count: 263 10*3/uL (ref 150–400)
RBC: 5.22 MIL/uL (ref 4.22–5.81)
RDW: 13 % (ref 11.5–15.5)
WBC Count: 10.7 10*3/uL — ABNORMAL HIGH (ref 4.0–10.5)
nRBC: 0 % (ref 0.0–0.2)

## 2019-08-20 LAB — CMP (CANCER CENTER ONLY)
ALT: 75 U/L — ABNORMAL HIGH (ref 0–44)
AST: 83 U/L — ABNORMAL HIGH (ref 15–41)
Albumin: 4.4 g/dL (ref 3.5–5.0)
Alkaline Phosphatase: 76 U/L (ref 38–126)
Anion gap: 11 (ref 5–15)
BUN: 9 mg/dL (ref 8–23)
CO2: 21 mmol/L — ABNORMAL LOW (ref 22–32)
Calcium: 9.3 mg/dL (ref 8.9–10.3)
Chloride: 101 mmol/L (ref 98–111)
Creatinine: 0.94 mg/dL (ref 0.61–1.24)
GFR, Est AFR Am: >60 mL/min (ref >60)
GFR, Estimated: >60 mL/min (ref >60)
Glucose, Bld: 237 mg/dL — ABNORMAL HIGH (ref 70–99)
Potassium: 4.3 mmol/L (ref 3.5–5.1)
Sodium: 133 mmol/L — ABNORMAL LOW (ref 135–145)
Total Bilirubin: 0.4 mg/dL (ref 0.3–1.2)
Total Protein: 7 g/dL (ref 6.5–8.1)

## 2019-08-20 LAB — LACTATE DEHYDROGENASE: LDH: 231 U/L — ABNORMAL HIGH (ref 98–192)

## 2019-08-20 NOTE — Progress Notes (Signed)
Hematology and Oncology Follow Up Visit  Darius Rogers [id number] Sep 05, 1956 63 y.o. 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  He is doing okay.  He still not really been able to travel.  He does have a family in the [place].  I nothing has been able to go there because of the coronavirus.  He did have a CT scan of the neck done today.  The CT scan did not show any evidence of recurrent parotid gland carcinoma.  He has had no swallowing problems.  There is been no dry mouth.  He has had no cough or shortness of breath.  His appetite is pretty good.  He is not a vegetarian.  He has had no bleeding or bruising.  Overall, his performance status is ECOG 0.    Medications:  Current Outpatient Medications:  .  aspirin EC 81 MG tablet, Take 81 mg by mouth daily., Disp: , Rfl:  .  atorvastatin (LIPITOR) 40 MG tablet, TAKE 1 TABLET BY MOUTH ONCE DAILY AT NIGHT, Disp: 90 tablet, Rfl: 3 .  carvedilol (COREG) 6.25 MG tablet, Take 6.25 mg by mouth 2 (two) times daily., Disp: , Rfl:  .  linaclotide (LINZESS) 145 MCG CAPS capsule, Take 1 capsule (145 mcg total) by mouth daily before breakfast., Disp: 30 capsule, Rfl: 4 .  lisinopril (ZESTRIL) 20 MG tablet, Take 1 tablet by mouth once daily, Disp: 60 tablet, Rfl: 0 .  loratadine (CLARITIN) 10 MG tablet, Take 10 mg by mouth daily as needed for allergies., Disp: , Rfl:  .  metFORMIN (GLUCOPHAGE) 500 MG tablet, Take 1,000 mg by mouth daily with breakfast. & 500 mg in the evening, Disp: , Rfl:  .  bisacodyl (DULCOLAX) 5 MG EC tablet, Take 5-10 mg by mouth daily as needed for moderate constipation. Takes 2 tabs at bedtime, sometimes takes one in am, Disp: , Rfl:  .  lubiprostone (AMITIZA) 8 MCG capsule, Take 1 capsule (8 mcg total) by mouth 2 (two) times daily with a  meal. (Patient not taking: Reported on 08/20/2019), Disp: 60 capsule, Rfl: 5 .  nitroGLYCERIN (NITROSTAT) 0.4 MG SL tablet, Place 1 tablet (0.4 mg total) under the tongue every 5 (five) minutes as needed for chest pain., Disp: 25 tablet, Rfl: 1  Allergies:  Allergies  Allergen Reactions  . Bactrim [Sulfamethoxazole-Trimethoprim] Hives  . Other Hives    Chili cheese fritos    Past Medical History, Surgical history, Social history, and Family History were reviewed and updated.  Review of Systems: Review of Systems  Constitutional: Negative.   HENT:  Negative.   Eyes: Negative.   Respiratory: Negative.   Cardiovascular: Negative.   Gastrointestinal: Negative.   Endocrine: Negative.   Genitourinary: Negative.    Musculoskeletal: Negative.   Skin: Negative.   Neurological: Positive for numbness. Negative for dizziness.  Hematological: Negative.   Psychiatric/Behavioral: Negative.     Physical Exam:  weight is 249 lb 12.8 oz (113.3 kg). His oral temperature is 97.1 F (36.2 C) (abnormal). His blood pressure is 128/72 and his pulse is 72. His respiration is 20 and oxygen saturation is 99%.   Wt Readings from Last 3 Encounters:  08/20/19 249 lb 12.8 oz (113.3 kg)  07/02/19 252 lb 6.4 oz (114.5 kg)  02/17/19 246 lb (111.6 kg)  Physical Exam Vitals signs reviewed.  HENT:     Head: Normocephalic and atraumatic.  Eyes:     Pupils: Pupils are equal, round, and reactive to light.  Neck:     Musculoskeletal: Normal range of motion.  Cardiovascular:     Rate and Rhythm: Normal rate and regular rhythm.     Heart sounds: Normal heart sounds.  Pulmonary:     Effort: Pulmonary effort is normal.     Breath sounds: Normal breath sounds.  Abdominal:     General: Bowel sounds are normal.     Palpations: Abdomen is soft.  Musculoskeletal: Normal range of motion.        General: No tenderness or deformity.  Lymphadenopathy:     Cervical: No cervical adenopathy.  Skin:     General: Skin is warm and dry.     Findings: No erythema or rash.  Neurological:     Mental Status: He is alert and oriented to person, place, and time.  Psychiatric:        Behavior: Behavior normal.        Thought Content: Thought content normal.        Judgment: Judgment normal.      Lab Results  Component Value Date   WBC 10.7 (H) 08/20/2019   HGB 15.6 08/20/2019   HCT 47.9 08/20/2019   MCV 91.8 08/20/2019   PLT 263 08/20/2019     Chemistry      Component Value Date/Time   NA 133 (L) 08/20/2019 0849   K 4.3 08/20/2019 0849   CL 101 08/20/2019 0849   CO2 21 (L) 08/20/2019 0849   BUN 9 08/20/2019 0849   CREATININE 0.94 08/20/2019 0849      Component Value Date/Time   CALCIUM 9.3 08/20/2019 0849   ALKPHOS 76 08/20/2019 0849   AST 83 (H) 08/20/2019 0849   ALT 75 (H) 08/20/2019 0849   BILITOT 0.4 08/20/2019 0849       Impression and Plan: Darius Rogers is a 63 year old white male.  He had a malignant parotid tumor that was resected from the right parotid gland.  He did not require any adjuvant therapy.  Right now, we will plan to get her back in 6 months.  I forgot to mention that he did have a ultrasound of his abdomen.  It looks like he does have a fatty liver.  His family doctor order this.  I am sure this is from his diabetes.  We will get another CT scan of his neck when we see him back.  If that scan looks okay, then maybe we can hold off on further scans on him since it would have been 2 years since resection.   Volanda Napoleon, MD 11/11/20209:36 AM

## 2019-08-20 NOTE — Telephone Encounter (Signed)
Appointments scheduled calendar printed per verbal request from Dr Marin Olp per 11/11 los

## 2019-08-21 IMAGING — US SOFT TISSUE ULTRASOUND HEAD/NECK
1 series · 14 of 25 positions shown · non-contrast
Comparison: Neck CT-08/19/2018

CLINICAL DATA: History right-sided parotid tumor resection in Tuesday February, 2018.

EXAM:
ULTRASOUND OF HEAD/NECK SOFT TISSUES
TECHNIQUE: Ultrasound examination of the head and neck soft tissues was
performed in the area of clinical concern.

[Series 1: soft tissue ultrasound head/neck · 99 acquisitions, 14 frames shown]
[im 1/99]
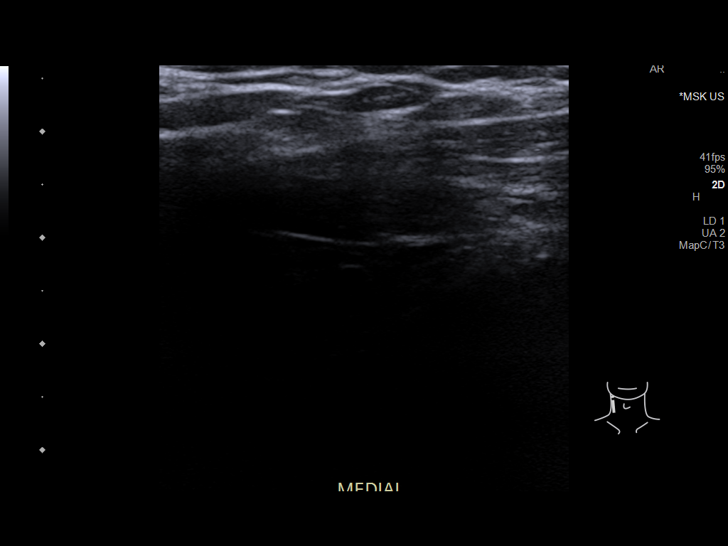
[im 9/99]
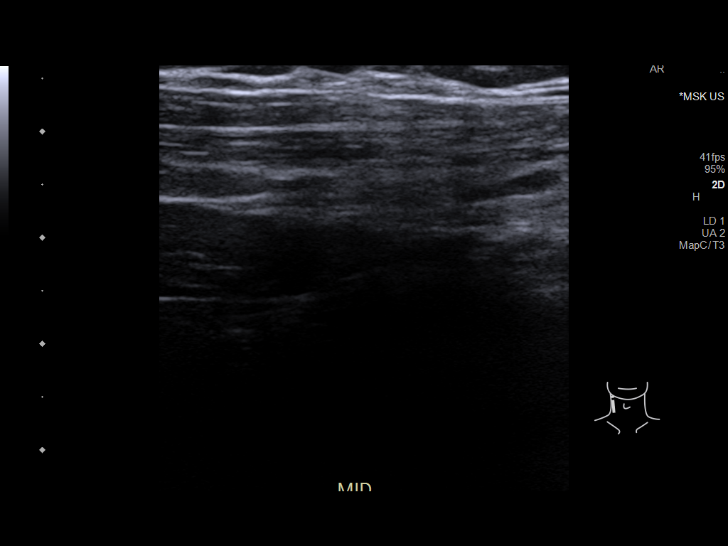
[im 17/99]
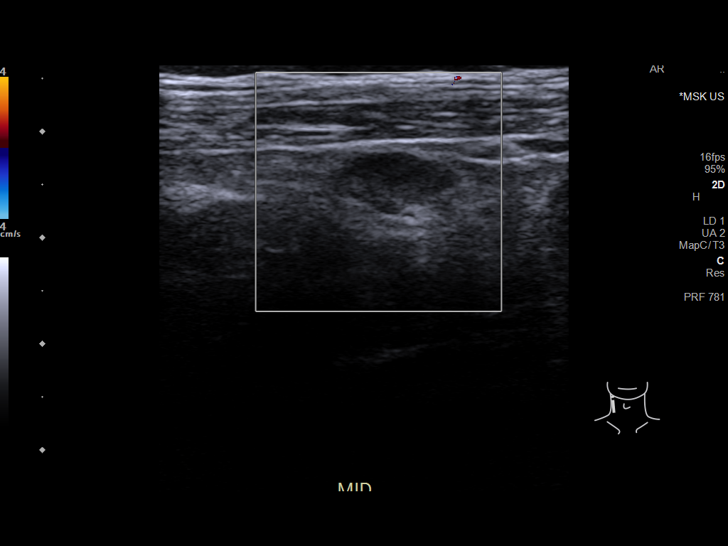
[im 25/99]
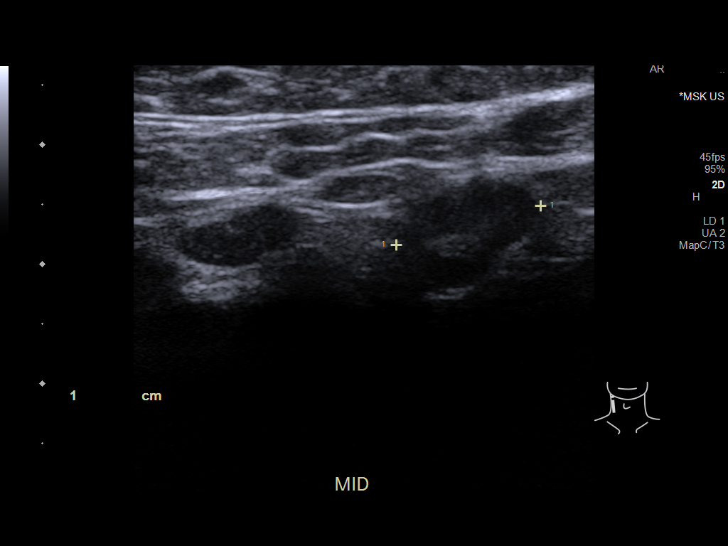
[im 33/99]
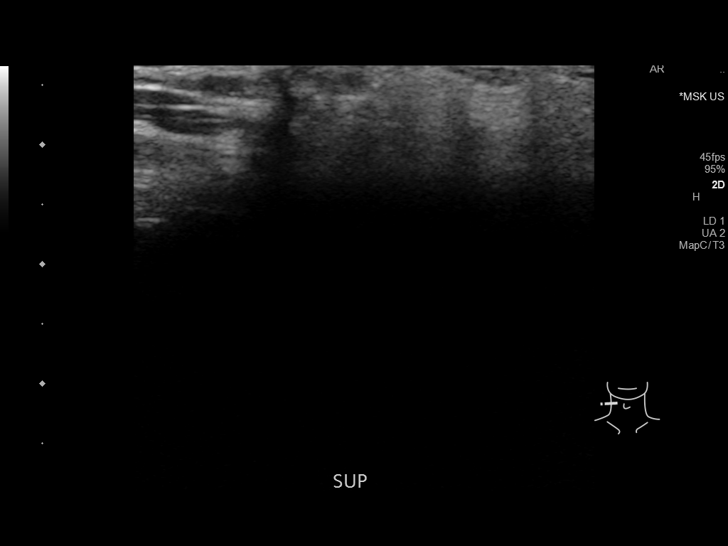
[im 37/99]
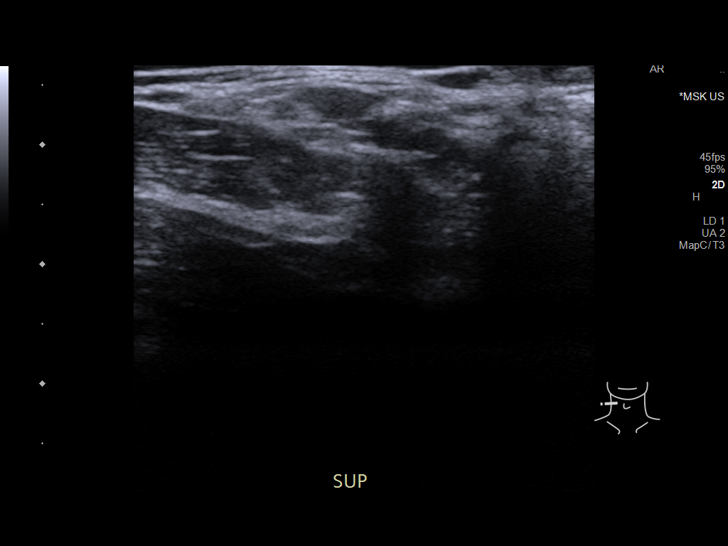
[im 45/99]
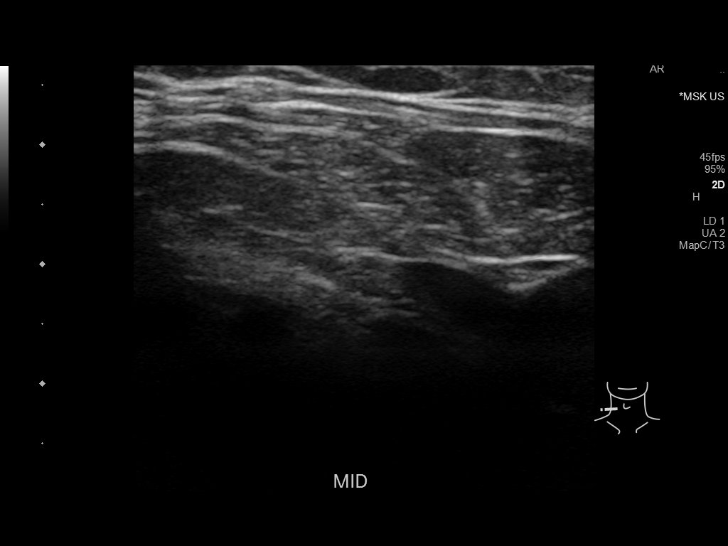
[im 54/99]
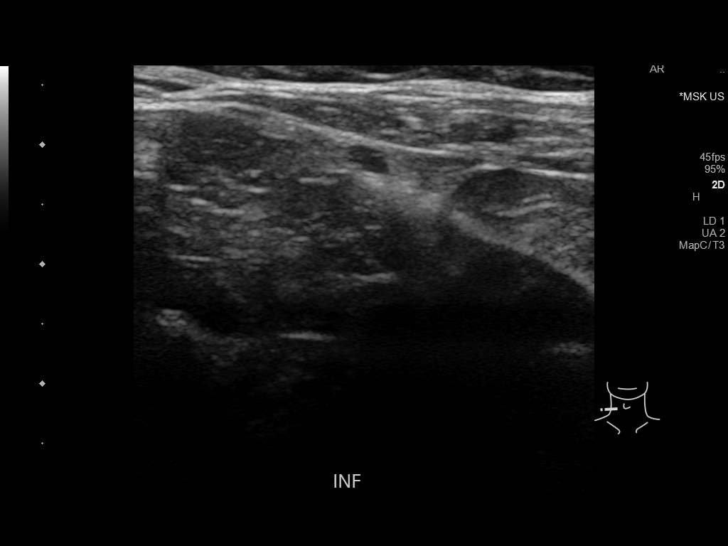
[im 62/99]
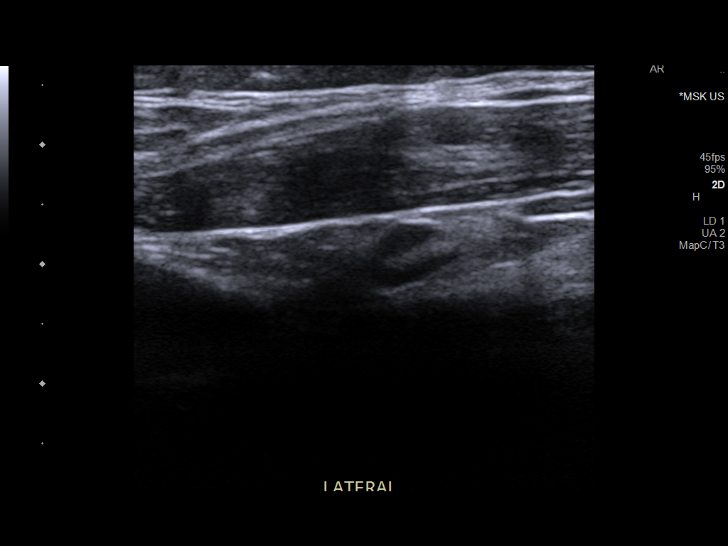
[im 66/99]
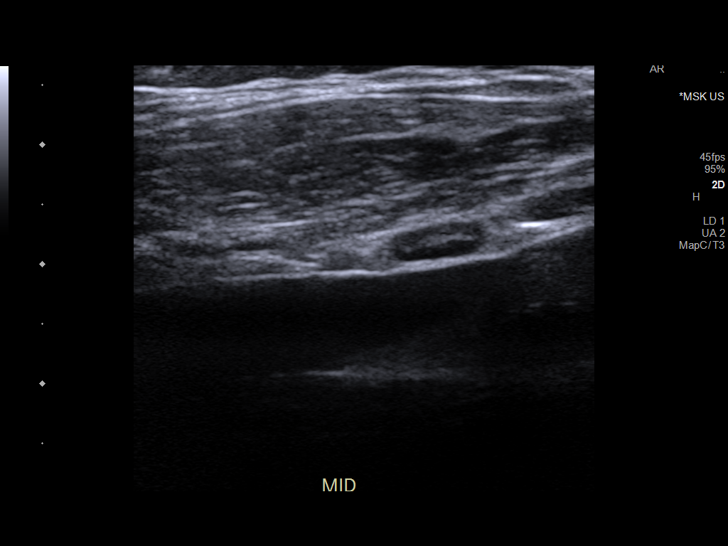
[im 74/99]
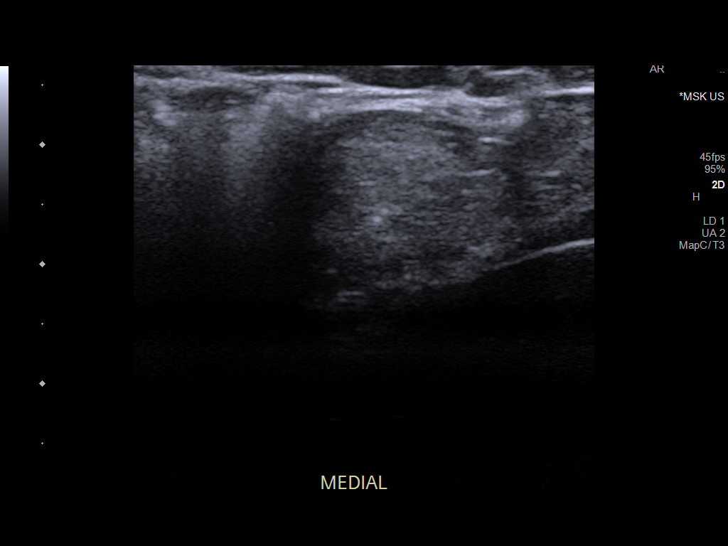
[im 82/99]
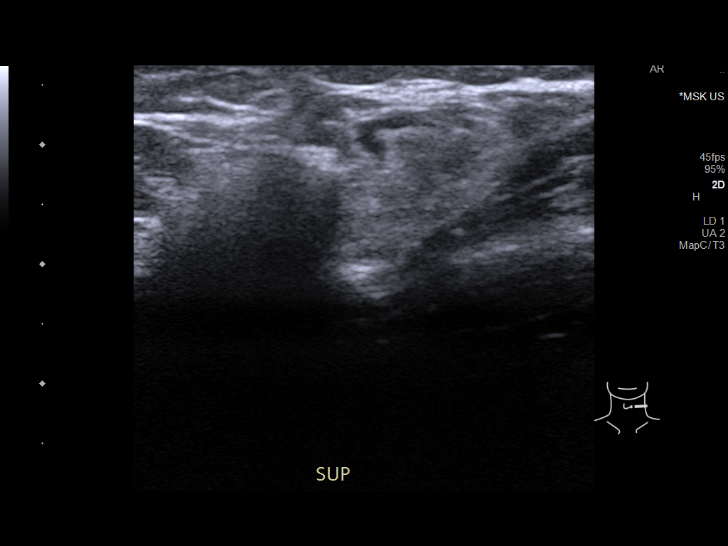
[im 90/99]
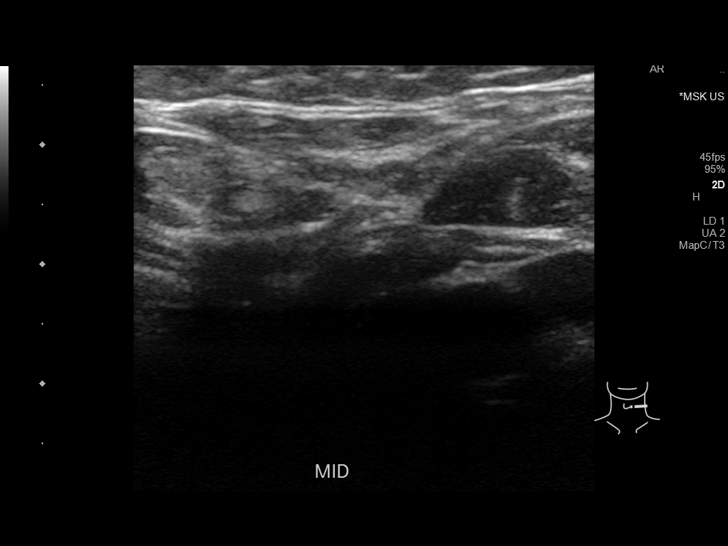
[im 99/99]
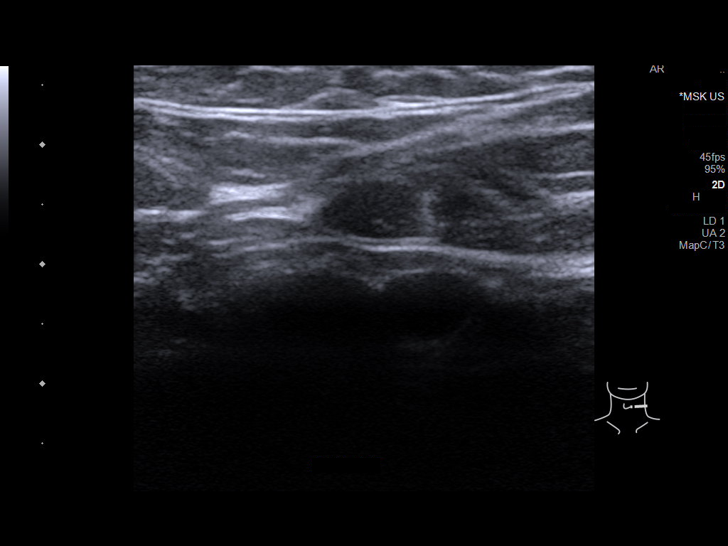

[14 of 25 positions shown; findings below may reference images not displayed]

FINDINGS: Sonographic evaluation of the right neck demonstrates a benign
appearing non pathologically enlarged right cervical lymph node
which measures approximately 0.7 cm in greatest short axis diameter
and maintains a benign fatty hilum (image 15).

There are no additional discrete solid or cystic lesions within the
imaged right neck.

Sonographic evaluation of the left neck performed for comparison
purposes demonstrates a similar appearing non pathologically
enlarged left cervical lymph node which measures approximately
cm in greatest short axis diameter and maintains a benign fatty
hilum (image 35).
IMPRESSION: No sonographic evidence of residual or metastatic disease to the
neck. Further evaluation with contrast-enhanced neck CT could be
performed as indicated.

## 2019-08-28 ENCOUNTER — Ambulatory Visit (INDEPENDENT_AMBULATORY_CARE_PROVIDER_SITE_OTHER): Payer: Medicaid Other | Admitting: Otolaryngology

## 2019-08-31 NOTE — Progress Notes (Addendum)
° °REVIEWED. DISCUSS CT WITH RADIOLOGY. ° °Referring Provider: Shah, Ashish, MD °Primary Care Physician:  Shah, Ashish, MD °Primary GI Physician: Dr. Fields ° °Chief Complaint  °Patient presents with  °• Constipation  °  BM's daily  ° ° °HPI:   °Darius Rogers is a 63 y.o. male presenting today for follow-up of constipation.  At last visit in September 2020, patient was seen in consultation for the same and reported struggling with constipation for the last 5 years.  He was taking 2 Dulcolax every morning and sometimes 1 in the evenings to have a BM daily but still felt bowel movements were incomplete.  He denied any alarm symptoms.  He reported his last colonoscopy was 5 years ago at [facility].  He was provided with Amitiza 8 mcg samples and advised to call with a progress report so we could send in a prescription if the medication worked well.  His colonoscopy records were also requested. ° °TCS records received from [facility] dated 12/30/14 with Dr. Benson. Findings included moderate diverticulosis in the descending and sigmoid colon, and 4 sessile polyps ranging between 5-9 mm in the cecum, ascending colon, and at splenic flexure.  Pathology revealed tubular adenomas.  Recommendations to repeat colonoscopy in 3 years. ° °Patient reported Amitiza worked well; however patient's insurance would not cover Amitiza so Linzess 145 mcg was sent to patient's pharmacy.  ° °Chart review with recent labs on 08/20/2019 with AST elevated at a 83 and ALT elevated at 75 (in May 2020, AST 32 and ALT 46).  Total bilirubin and alk phos within normal limits.  In review of recent oncology note, it states patient had ultrasound ordered by PCP which revealed fatty liver.  I am unable to see the imaging report. ° °Today he states Linzess is working well. BMs daily. No blood in the stool or black stools. Stools are soft to mushy with Linzess. No abdominal pain. No nausea or vomiting. No heartburn or acid reflux. No dysphagia.  Reports he has decreased intake of sweets. Trying to do some exercise outside around the house. Lost about 5 lbs.  ° °Elevated LFTs: No abdominal swelling or lower extremity swelling. No yellowing of skin, confusion, or bruising or bleeding. Reports PCP checked for hepatitis and results were negative. Thinks last A1C was 7.8. Metformin was increased to 1000 mg BID. Rarely drinks alcohol now. Used to drink a lot prior to his heart attack in 2014. Was drinking quite a bit for 5 years. After MI, he quit smoking completely and alcohol intake for the most part. No OTC supplements or herbal teas. No history of IV or intranasal drug use. ° °No fever, chills, lightheadedness, dizziness, or feeling like he will pass out. No chest pain, heart palpitations, shortness of breath, or cough.  ° °Past Medical History:  °Diagnosis Date  °• Arthritis   ° "knees" (11/13/2017)  °• CAD (coronary artery disease)   °• Constipation   °• Heart attack (HCC) 2014  ° DES in LAD  °• Hyperlipemia   °• Hypertension   °• Type II diabetes mellitus (HCC)   ° ° °Past Surgical History:  °Procedure Laterality Date  °• COLONOSCOPY    °• CORONARY ANGIOPLASTY WITH STENT PLACEMENT  2014  °• CYSTOSCOPY WITH INSERTION OF UROLIFT N/A 03/11/2018  ° Procedure: CYSTOSCOPY WITH INSERTION OF UROLIFT;  Surgeon: McKenzie, Patrick L, MD;  Location: [facility];  Service: Urology;  Laterality: N/A;  °• FOREARM FRACTURE SURGERY Right 1975  °• FRACTURE SURGERY Right   1975  ° R arm   °• JOINT REPLACEMENT    °• PAROTIDECTOMY Right 02/19/2018  ° Procedure: RIGHT PAROTIDECTOMY;  Surgeon: Teoh, Su, MD;  Location: [facility];  Service: ENT;  Laterality: Right;  RNFA  °• TOTAL KNEE ARTHROPLASTY Right 05/08/2017  ° Procedure: RIGHT TOTAL KNEE ARTHROPLASTY;  Surgeon: Whitfield, Peter W, MD;  Location: [facility];  Service: Orthopedics;  Laterality: Right;  °• TOTAL KNEE ARTHROPLASTY Left 11/13/2017  °• TOTAL KNEE ARTHROPLASTY Left 11/13/2017  ° Procedure: LEFT TOTAL KNEE  ARTHROPLASTY;  Surgeon: Whitfield, Peter W, MD;  Location: [facility];  Service: Orthopedics;  Laterality: Left;  ° ° °Current Outpatient Medications  °Medication Sig Dispense Refill  °• aspirin EC 81 MG tablet Take 81 mg by mouth daily.    °• atorvastatin (LIPITOR) 40 MG tablet TAKE 1 TABLET BY MOUTH ONCE DAILY AT NIGHT 90 tablet 3  °• carvedilol (COREG) 6.25 MG tablet Take 6.25 mg by mouth 2 (two) times daily.    °• linaclotide (LINZESS) 145 MCG CAPS capsule Take 1 capsule (145 mcg total) by mouth daily before breakfast. 30 capsule 4  °• lisinopril (ZESTRIL) 20 MG tablet Take 1 tablet by mouth once daily 60 tablet 0  °• loratadine (CLARITIN) 10 MG tablet Take 10 mg by mouth daily as needed for allergies.    °• metFORMIN (GLUCOPHAGE) 500 MG tablet Take 1,000 mg by mouth 2 (two) times daily with a meal.     °• nitroGLYCERIN (NITROSTAT) 0.4 MG SL tablet Place 1 tablet (0.4 mg total) under the tongue every 5 (five) minutes as needed for chest pain. 25 tablet 1  ° °No current facility-administered medications for this visit.   ° ° °Allergies as of 09/01/2019 - Review Complete 09/01/2019  °Allergen Reaction Noted  °• Bactrim [sulfamethoxazole-trimethoprim] Hives 02/12/2018  ° ° °Family History  °Problem Relation Age of Onset  °• Hypertension Mother   °• Diabetes Mother   °• Post-traumatic stress disorder Father   °• Sleep apnea Father   °• Other Maternal Grandmother   °     PPM  °• Heart attack Maternal Grandfather   °• Stomach cancer Paternal Grandmother   °• Colon cancer Neg Hx   °• Colon polyps Neg Hx   ° ° °Social History  ° °Socioeconomic History  °• Marital status: Divorced  °  Spouse name: Not on file  °• Number of children: Not on file  °• Years of education: Not on file  °• Highest education level: Not on file  °Occupational History  °• Not on file  °Social Needs  °• Financial resource strain: Not on file  °• Food insecurity  °  Worry: Not on file  °  Inability: Not on file  °• Transportation needs  °  Medical: Not  on file  °  Non-medical: Not on file  °Tobacco Use  °• Smoking status: Former Smoker  °  Packs/day: 0.50  °  Years: 11.00  °  Pack years: 5.50  °  Types: Cigarettes  °  Start date: 10/09/2004  °  Quit date: 05/09/2013  °  Years since quitting: 6.3  °• Smokeless tobacco: Never Used  °Substance and Sexual Activity  °• Alcohol use: No  °  Alcohol/week: 0.0 standard drinks  °• Drug use: No  °• Sexual activity: Not Currently  °Lifestyle  °• Physical activity  °  Days per week: Not on file  °  Minutes per session: Not on file  °• Stress: Not   Not on file  Relationships   Social connections    Talks on phone: Not on file    Gets together: Not on file    Attends religious service: Not on file    Active member of club or organization: Not on file    Attends meetings of clubs or organizations: Not on file    Relationship status: Not on file  Other Topics Concern   Not on file  Social History Narrative   Not on file    Review of Systems: Gen: See HPI CV: See HPI Resp: See HPI GI: See HPI Derm: Denies rash Psych: Denies depression, anxiety Heme: Denies bruising, bleeding  Physical Exam: BP 121/83    Pulse 80    Temp 97.7 F (36.5 C) (Oral)    Ht 6' (1.829 m)    Wt 247 lb 6.4 oz (112.2 kg)    BMI 33.55 kg/m  General:   Alert and oriented. No distress noted. Pleasant and cooperative.  Head:  Normocephalic and atraumatic. Eyes:  Conjuctiva clear without scleral icterus. Heart:  S1, S2 present without murmurs appreciated. Lungs:  Clear to auscultation bilaterally. No wheezes, rales, or rhonchi. No distress.  Abdomen:  +BS, soft, non-tender and non-distended. No rebound or guarding. No HSM or masses noted. Msk:  Symmetrical without gross deformities. Normal posture. Extremities:  Without edema. Neurologic:  Alert and  oriented x4 Psych: Normal mood and affect.

## 2019-09-01 ENCOUNTER — Telehealth: Payer: Self-pay | Admitting: Cardiovascular Disease

## 2019-09-01 ENCOUNTER — Ambulatory Visit (INDEPENDENT_AMBULATORY_CARE_PROVIDER_SITE_OTHER): Payer: Medicaid Other | Admitting: Gastroenterology

## 2019-09-01 ENCOUNTER — Encounter: Payer: Self-pay | Admitting: Gastroenterology

## 2019-09-01 ENCOUNTER — Other Ambulatory Visit: Payer: Self-pay

## 2019-09-01 VITALS — BP 121/83 | HR 80 | Temp 97.7°F | Ht 72.0 in | Wt 247.4 lb

## 2019-09-01 DIAGNOSIS — Z86010 Personal history of colonic polyps: Secondary | ICD-10-CM

## 2019-09-01 DIAGNOSIS — R7989 Other specified abnormal findings of blood chemistry: Secondary | ICD-10-CM

## 2019-09-01 DIAGNOSIS — K5900 Constipation, unspecified: Secondary | ICD-10-CM

## 2019-09-01 MED ORDER — LINACLOTIDE 145 MCG PO CAPS: 145.0000 ug | ORAL_CAPSULE | Freq: Every day | ORAL | 5 refills | Status: DC

## 2019-09-01 NOTE — Telephone Encounter (Signed)
Virtual Visit Pre-Appointment Phone Call  "(Name), I am calling you today to discuss your upcoming appointment. We are currently trying to limit exposure to the virus that causes COVID-19 by seeing patients at home rather than in the office."  1. "What is the BEST phone number to call the day of the visit?" - include this in appointment notes  2. Do you have or have access to (through a family member/friend) a smartphone with video capability that we can use for your visit?" a. If yes - list this number in appt notes as cell (if different from BEST phone #) and list the appointment type as a VIDEO visit in appointment notes b. If no - list the appointment type as a PHONE visit in appointment notes  3. Confirm consent - "In the setting of the current Covid19 crisis, you are scheduled for a (phone or video) visit with your provider on (date) at (time).  Just as we do with many in-office visits, in order for you to participate in this visit, we must obtain consent.  If you'd like, I can send this to your mychart (if signed up) or email for you to review.  Otherwise, I can obtain your verbal consent now.  All virtual visits are billed to your insurance company just like a normal visit would be.  By agreeing to a virtual visit, we'd like you to understand that the technology does not allow for your provider to perform an examination, and thus may limit your provider's ability to fully assess your condition. If your provider identifies any concerns that need to be evaluated in person, we will make arrangements to do so.  Finally, though the technology is pretty good, we cannot assure that it will always work on either your or our end, and in the setting of a video visit, we may have to convert it to a phone-only visit.  In either situation, we cannot ensure that we have a secure connection.  Are you willing to proceed?" STAFF: Did the patient verbally acknowledge consent to telehealth visit? Document  YES/NO here: yes  4. Advise patient to be prepared - "Two hours prior to your appointment, go ahead and check your blood pressure, pulse, oxygen saturation, and your weight (if you have the equipment to check those) and write them all down. When your visit starts, your provider will ask you for this information. If you have an Apple Watch or Kardia device, please plan to have heart rate information ready on the day of your appointment. Please have a pen and paper handy nearby the day of the visit as well."  5. Give patient instructions for MyChart download to smartphone OR [email or web address] as below if video visit (depending on what platform provider is using)  6. Inform patient they will receive a phone call 15 minutes prior to their appointment time (may be from unknown caller ID) so they should be prepared to answer    TELEPHONE CALL NOTE  Idrissa Mattucci has been deemed a candidate for a follow-up tele-health visit to limit community exposure during the Covid-19 pandemic. I spoke with the patient via phone to ensure availability of phone/video source, confirm preferred email & phone number, and discuss instructions and expectations.  I reminded Mainor Dewit to be prepared with any vital sign and/or heart rhythm information that could potentially be obtained via home monitoring, at the time of his visit. I reminded Datron Lopiano to expect a phone call prior to his visit.  Weston Anna 09/01/2019 3:36 PM   INSTRUCTIONS FOR DOWNLOADING THE MYCHART APP TO SMARTPHONE  - The patient must first make sure to have activated MyChart and know their login information - If Apple, go to [employer] and type in MyChart in the search bar and download the app. If Android, ask patient to go to [employer] and type in [place] in the search bar and download the app. The app is free but as with any other app downloads, their phone may require them to verify saved payment information or Apple/Android  password.  - The patient will need to then log into the app with their MyChart username and password, and select [facility] as their healthcare provider to link the account. When it is time for your visit, go to the MyChart app, find appointments, and click Begin Video Visit. Be sure to Select Allow for your device to access the Microphone and Camera for your visit. You will then be connected, and your provider will be with you shortly.  **If they have any issues connecting, or need assistance please contact MyChart service desk [phone])**  **If using a computer, in order to ensure the best quality for their visit they will need to use either of the following Internet Browsers: [employer], or Google Chrome**  IF USING DOXIMITY or DOXY.ME - The patient will receive a link just prior to their visit by text.     FULL LENGTH CONSENT FOR TELE-HEALTH VISIT   I hereby voluntarily request, consent and authorize [facility] and its employed or contracted physicians, physician assistants, nurse practitioners or other licensed health care professionals (the Practitioner), to provide me with telemedicine health care services (the Services") as deemed necessary by the treating Practitioner. I acknowledge and consent to receive the Services by the Practitioner via telemedicine. I understand that the telemedicine visit will involve communicating with the Practitioner through live audiovisual communication technology and the disclosure of certain medical information by electronic transmission. I acknowledge that I have been given the opportunity to request an in-person assessment or other available alternative prior to the telemedicine visit and am voluntarily participating in the telemedicine visit.  I understand that I have the right to withhold or withdraw my consent to the use of telemedicine in the course of my care at any time, without affecting my right to future care or treatment,  and that the Practitioner or I may terminate the telemedicine visit at any time. I understand that I have the right to inspect all information obtained and/or recorded in the course of the telemedicine visit and may receive copies of available information for a reasonable fee.  I understand that some of the potential risks of receiving the Services via telemedicine include:   Delay or interruption in medical evaluation due to technological equipment failure or disruption;  Information transmitted may not be sufficient (e.g. poor resolution of images) to allow for appropriate medical decision making by the Practitioner; and/or   In rare instances, security protocols could fail, causing a breach of personal health information.  Furthermore, I acknowledge that it is my responsibility to provide information about my medical history, conditions and care that is complete and accurate to the best of my ability. I acknowledge that Practitioner's advice, recommendations, and/or decision may be based on factors not within their control, such as incomplete or inaccurate data provided by me or distortions of diagnostic images or specimens that may result from electronic transmissions. I understand that the  practice of medicine is not an Chief Strategy Officer and that Practitioner makes no warranties or guarantees regarding treatment outcomes. I acknowledge that I will receive a copy of this consent concurrently upon execution via email to the email address I last provided but may also request a printed copy by calling the office of [facility].    I understand that my insurance will be billed for this visit.   I have read or had this consent read to me.  I understand the contents of this consent, which adequately explains the benefits and risks of the Services being provided via telemedicine.   I have been provided ample opportunity to ask questions regarding this consent and the Services and have had my questions  answered to my satisfaction.  I give my informed consent for the services to be provided through the use of telemedicine in my medical care  By participating in this telemedicine visit I agree to the above.

## 2019-09-01 NOTE — Assessment & Plan Note (Signed)
Much improved on Linzess 145 mcg daily.  Currently having BMs daily without bright red blood per rectum or melena.   Continue Linzess 145 mcg daily.  Prescription refilled today.

## 2019-09-01 NOTE — Assessment & Plan Note (Addendum)
64 year old male with history of type 2 diabetes, HTN, HLD, MI in 2014 s/p DES in LAD, constipation now well controlled on Linzess, and adenomatous colon polyps due for surveillance colonoscopy.  Last colonoscopy at [facility] on 12/30/2014 with moderate diverticulosis in the descending and sigmoid colon and 4 sessile polyps ranging between 5-9 mm in the cecum, ascending colon, and at splenic flexure.  Pathology revealed tubular adenomas.  Recommendations to repeat colonoscopy in 3 years.  He is currently without any significant upper or lower GI symptoms at this time.  No bright red blood per rectum or melena.  He has lost about 5 pounds since September but states he has decreased his sugar/sweets intake and trying to exercise around the house. No family history of colon cancer.   Proceed with TCS with Dr. Oneida Alar in the near future. The risks, benefits, and alternatives have been discussed in detail with patient. They have stated understanding and desire to proceed.  Follow-up after procedure.

## 2019-09-01 NOTE — Progress Notes (Signed)
cc'ed to pcp °

## 2019-09-01 NOTE — Patient Instructions (Addendum)
We will get you scheduled for your colonoscopy with Dr. Oneida Alar in the near future. 1 day prior to your procedure: Take metformin 500 mg in the morning and in the evening. Day of your procedure: Do not take any diabetes medications the morning of your procedure.  Continue taking Linzess 145 mcg in the morning on empty stomach.  I have refilled your prescription.  I will request your recent labs from your primary care in reference to your elevated liver function test.  Instructions for fatty liver: Recommend 1-2# weight loss per week until ideal body weight through exercise & diet. Low fat/cholesterol diet.   Avoid sweets, sodas, fruit juices, sweetened beverages like tea, etc. Gradually increase exercise from 15 min daily up to 1 hr per day 5 days/week. Limit alcohol use.  Limit Tylenol use Avoid over the counter supplements and herbal teas.   We will plan to see you back in the office after your colonoscopy.  Call if you have questions or concerns prior.  Aliene Altes, PA-C [facility]   Fatty Liver Disease  Fatty liver disease occurs when too much fat has built up in your liver cells. Fatty liver disease is also called hepatic steatosis or steatohepatitis. The liver removes harmful substances from your bloodstream and produces fluids that your body needs. It also helps your body use and store energy from the food you eat. In many cases, fatty liver disease does not cause symptoms or problems. It is often diagnosed when tests are being done for other reasons. However, over time, fatty liver can cause inflammation that may lead to more serious liver problems, such as scarring of the liver (cirrhosis) and liver failure. Fatty liver is associated with insulin resistance, increased body fat, high blood pressure (hypertension), and high cholesterol. These are features of metabolic syndrome and increase your risk for stroke, diabetes, and heart disease. What are the  causes? This condition may be caused by:  Drinking too much alcohol.  Poor nutrition.  Obesity.  Cushing's syndrome.  Diabetes.  High cholesterol.  Certain drugs.  Poisons.  Some viral infections.  Pregnancy. What increases the risk? You are more likely to develop this condition if you:  Abuse alcohol.  Are overweight.  Have diabetes.  Have hepatitis.  Have a high triglyceride level.  Are pregnant. What are the signs or symptoms? Fatty liver disease often does not cause symptoms. If symptoms do develop, they can include:  Fatigue.  Weakness.  Weight loss.  Confusion.  Abdominal pain.  Nausea and vomiting.  Yellowing of your skin and the white parts of your eyes (jaundice).  Itchy skin. How is this diagnosed? This condition may be diagnosed by:  A physical exam and medical history.  Blood tests.  Imaging tests, such as an ultrasound, CT scan, or MRI.  A liver biopsy. A small sample of liver tissue is removed using a needle. The sample is then looked at under a microscope. How is this treated? Fatty liver disease is often caused by other health conditions. Treatment for fatty liver may involve medicines and lifestyle changes to manage conditions such as:  Alcoholism.  High cholesterol.  Diabetes.  Being overweight or obese. Follow these instructions at home:   Do not drink alcohol. If you have trouble quitting, ask your health care provider how to safely quit with the help of medicine or a supervised program. This is important to keep your condition from getting worse.  Eat a healthy diet as told by your health care  provider. Ask your health care provider about working with a diet and nutrition specialist (dietitian) to develop an eating plan.  Exercise regularly. This can help you lose weight and control your cholesterol and diabetes. Talk to your health care provider about an exercise plan and which activities are best for you.  Take  over-the-counter and prescription medicines only as told by your health care provider.  Keep all follow-up visits as told by your health care provider. This is important. Contact a health care provider if: You have trouble controlling your:  Blood sugar. This is especially important if you have diabetes.  Cholesterol.  Drinking of alcohol. Get help right away if:  You have abdominal pain.  You have jaundice.  You have nausea and vomiting.  You vomit blood or material that looks like coffee grounds.  You have stools that are black, tar-like, or bloody. Summary  Fatty liver disease develops when too much fat builds up in the cells of your liver.  Fatty liver disease often causes no symptoms or problems. However, over time, fatty liver can cause inflammation that may lead to more serious liver problems, such as scarring of the liver (cirrhosis).  You are more likely to develop this condition if you abuse alcohol, are pregnant, are overweight, have diabetes, have hepatitis, or have high triglyceride levels.  Contact your health care provider if you have trouble controlling your weight, blood sugar, cholesterol, or drinking of alcohol. This information is not intended to replace advice given to you by your health care provider. Make sure you discuss any questions you have with your health care provider. Document Released: 11/10/2005 Document Revised: 09/07/2017 Document Reviewed: 07/04/2017 Elsevier Patient Education  2020 [employer].

## 2019-09-01 NOTE — Assessment & Plan Note (Addendum)
Recent labs on 08/20/2019 with AST elevated at a 83 and ALT elevated at 75 (in May 2020, AST 32 and ALT 46).  Total bilirubin and alk phos within normal limits.  Patient reports ultrasound with PCP that revealed fatty liver.  Also states he was evaluated for hepatitis which was negative.  He is without any signs or symptoms of advanced liver disease.  He reports his last A1c was 7.8 and his Metformin was increased to 1000 mg twice daily.  Rarely drinks alcohol at this time.  Reports he drank quite a bit prior to 2014 for about 5 years.  Quit any regular use of alcohol after his MI in 2014.  Denies any history of drug use.  Denies OTC supplements or herbal teas.  Suspect mild elevation of AST and ALT are likely secondary to underlying fatty liver. He was counseled on weight loss, diet, and exercise.  He was also counseled on the importance of keeping his diabetes well controlled.  Recommend 1-2# weight loss per week until ideal body weight through exercise & diet. Low fat/cholesterol diet.   Avoid sweets, sodas, fruit juices, sweetened beverages like tea, etc. Gradually increase exercise from 15 min daily up to 1 hr per day 5 days/week. Limit alcohol use. Limit Tylenol use. Avoid over-the-counter supplements and herbal teas. Fatty liver handout provided. We will request recent ultrasound and labs including hepatitis panel from PCP. Follow-up after colonoscopy.  Will plan to recheck labs at that time. Will add iron panel if not obtained by PCP.

## 2019-09-06 ENCOUNTER — Telehealth: Payer: Self-pay | Admitting: Gastroenterology

## 2019-09-06 NOTE — Telephone Encounter (Signed)
Received and reviewed labs and ultrasound from PCP.  Abdominal ultrasound dated 05/26/2019: Mildly enlarged liver at 18.1 cm, fatty infiltration of the liver, multiple hepatic cyst with the largest in the left lobe measuring 31 x 36 x 37 mm.  No bile duct dilation.  CBD normal at 4 mm.  Spleen normal.  No ascites.  07/26/2019 CBC: WBC 11.1 (H), hemoglobin 16.3, MCV 89, MCH 29.9, MCHC 33.4, platelets 307 CMP: Glucose 134, creatinine 1.06, sodium 138, potassium 4.8, chloride 102, calcium 9.4, albumin 4.3, total bilirubin 0.5, alk phos 102, AST 90 (H), ALT 92 (H) PSA 0.5  07/30/2019 Hepatitis C antibody negative Hepatitis B surface antigen negative  08/14/2019 Hemoglobin A1c 7.7 (H)  [name], please let patient know I have received and reviewed his records from his primary care.  AST and ALT were slightly improved on his most recent labs in November (AST 83, ALT 75) compared to October (AST 90, ALT 92). There are a few labs I would like to obtain in addition to labs drawn by his PCP.  Could you please place orders for iron panel with ferritin, Hep B core Antibody, Hep B surface antibody, and INR?  DX: Elevated LFTs, fatty liver, enlarged liver, hepatic cyst.  As discussed at his office visit, I suspect his LFTs are elevated secondary to fatty liver.  He should continue with dietary modifications, exercise, weight loss, limiting Tylenol and OTC supplements, and avoiding alcohol as discussed at office visit.  We will plan to recheck labs at next visit.  He did have several small cyst on his liver identified on ultrasound.  These are typically benign and do not require serial imaging; however, due to there being multiple cyst, I will discuss further with Dr. Oneida Alar asked whether he needs further imaging.

## 2019-09-08 ENCOUNTER — Telehealth: Payer: Self-pay | Admitting: *Deleted

## 2019-09-08 ENCOUNTER — Other Ambulatory Visit: Payer: Self-pay | Admitting: *Deleted

## 2019-09-08 DIAGNOSIS — Z86010 Personal history of colonic polyps: Secondary | ICD-10-CM

## 2019-09-08 MED ORDER — CLENPIQ 10-3.5-12 MG-GM -GM/160ML PO SOLN: 1.0000 | Freq: Once | ORAL | 0 refills | Status: AC

## 2019-09-08 NOTE — Telephone Encounter (Signed)
Called pt and TCS scheduled for 12/29/2019 at 9:30am. Also scheduled covid testing with pt for 3/19 at 3:00pm. Patient aware will mail instructions to him. Confirmed address. Rx sent. Orders placed.

## 2019-09-09 ENCOUNTER — Encounter: Payer: Self-pay | Admitting: Cardiovascular Disease

## 2019-09-09 ENCOUNTER — Other Ambulatory Visit: Payer: Self-pay

## 2019-09-09 ENCOUNTER — Telehealth (INDEPENDENT_AMBULATORY_CARE_PROVIDER_SITE_OTHER): Payer: Medicaid Other | Admitting: Cardiovascular Disease

## 2019-09-09 VITALS — Ht 72.0 in | Wt 245.0 lb

## 2019-09-09 DIAGNOSIS — K7689 Other specified diseases of liver: Secondary | ICD-10-CM

## 2019-09-09 DIAGNOSIS — K760 Fatty (change of) liver, not elsewhere classified: Secondary | ICD-10-CM

## 2019-09-09 DIAGNOSIS — R7989 Other specified abnormal findings of blood chemistry: Secondary | ICD-10-CM

## 2019-09-09 DIAGNOSIS — I10 Essential (primary) hypertension: Secondary | ICD-10-CM

## 2019-09-09 DIAGNOSIS — R160 Hepatomegaly, not elsewhere classified: Secondary | ICD-10-CM

## 2019-09-09 DIAGNOSIS — Z955 Presence of coronary angioplasty implant and graft: Secondary | ICD-10-CM

## 2019-09-09 DIAGNOSIS — E119 Type 2 diabetes mellitus without complications: Secondary | ICD-10-CM

## 2019-09-09 DIAGNOSIS — I25118 Atherosclerotic heart disease of native coronary artery with other forms of angina pectoris: Secondary | ICD-10-CM

## 2019-09-09 DIAGNOSIS — E785 Hyperlipidemia, unspecified: Secondary | ICD-10-CM

## 2019-09-09 NOTE — Progress Notes (Signed)
Virtual Visit via Telephone Note   This visit type was conducted due to national recommendations for restrictions regarding the COVID-19 Pandemic (e.g. social distancing) in an effort to limit this patient's exposure and mitigate transmission in our community.  Due to his co-morbid illnesses, this patient is at least at moderate risk for complications without adequate follow up.  This format is felt to be most appropriate for this patient at this time.  The patient did not have access to video technology/had technical difficulties with video requiring transitioning to audio format only (telephone).  All issues noted in this document were discussed and addressed.  No physical exam could be performed with this format.  Please refer to the patient's chart for his  consent to telehealth for [facility]   Date:  09/09/2019   ID:  Darius Rogers, DOB November 27, 1955, MRN [id number]  Patient Location: Home Provider Location: Office  PCP:  Monico Blitz, MD  Cardiologist:  Kate Sable, MD  Electrophysiologist:  None   Evaluation Performed:  Follow-Up Visit  Chief Complaint:  CAD  History of Present Illness:    Darius Rogers is a 63 y.o. male with coronary artery disease.  He has a history of non-ST elevation myocardial infarction and underwent percutaneous coronary intervention with a drug-eluting stent to the LAD in July 2014 at [facility] in [place], [place]. He also has a history of hypertension, tobacco abuse, and obesity.   He had an echocardiogram at that time which reportedly demonstrated normal left ventricular systolic and diastolic function, LVEF [id number].   Coronary angiography on 05/05/13 demonstrated a 95% stenosis of the LAD with mild luminal irregularities in the left circumflex and a mid RCA stenosis of 20%. The RCA was the dominant artery.  He is engaged to a nurse who lives in the [place].  The patient denies any symptoms of chest pain, palpitations, shortness  of breath, lightheadedness, dizziness, leg swelling, orthopnea, PND, and syncope.  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   Soc Hx:He used to live in [place] and worked as a Chief Strategy Officer but moved to [place], [place], to be close to his parents who are elderly. Interestingly, he had been climbing up a tree for his work and as he was coming down he saw a rattlesnake and that is when he initially began to feel chest pain prior to being diagnosed with a myocardial infarction.  Past Medical History:  Diagnosis Date  . Arthritis    "knees" (11/13/2017)  . CAD (coronary artery disease)   . Constipation   . Heart attack ([place]) 2014   DES in LAD  . Hyperlipemia   . Hypertension   . Type II diabetes mellitus ([place])    Past Surgical History:  Procedure Laterality Date  . COLONOSCOPY    . CORONARY ANGIOPLASTY WITH STENT PLACEMENT  2014  . CYSTOSCOPY WITH INSERTION OF UROLIFT N/A 03/11/2018   Procedure: CYSTOSCOPY WITH INSERTION OF UROLIFT;  Surgeon: Cleon Gustin, MD;  Location: [facility];  Service: Urology;  Laterality: N/A;  . FOREARM FRACTURE SURGERY Right 1975  . FRACTURE SURGERY Right 1975   R arm   . JOINT REPLACEMENT    . PAROTIDECTOMY Right 02/19/2018   Procedure: RIGHT PAROTIDECTOMY;  Surgeon: [name], MD;  Location: [facility];  Service: ENT;  Laterality: Right;  RNFA  . TOTAL KNEE ARTHROPLASTY Right 05/08/2017   Procedure: RIGHT TOTAL KNEE ARTHROPLASTY;  Surgeon: Garald Balding, MD;  Location: [facility];  Service: Orthopedics;  Laterality: Right;  . TOTAL KNEE ARTHROPLASTY Left 11/13/2017  . TOTAL KNEE ARTHROPLASTY Left 11/13/2017   Procedure: LEFT TOTAL KNEE ARTHROPLASTY;  Surgeon: Garald Balding, MD;  Location: [place];  Service: Orthopedics;  Laterality: Left;     Current Meds  Medication Sig  . aspirin EC 81 MG tablet Take 81 mg by mouth daily.  Marland Kitchen atorvastatin (LIPITOR) 40 MG tablet TAKE 1 TABLET BY MOUTH ONCE DAILY AT NIGHT  . carvedilol (COREG)  6.25 MG tablet Take 6.25 mg by mouth 2 (two) times daily.  Marland Kitchen linaclotide (LINZESS) 145 MCG CAPS capsule Take 1 capsule (145 mcg total) by mouth daily before breakfast.  . lisinopril (ZESTRIL) 20 MG tablet Take 1 tablet by mouth once daily  . loratadine (CLARITIN) 10 MG tablet Take 10 mg by mouth daily as needed for allergies.  . metFORMIN (GLUCOPHAGE) 500 MG tablet Take 1,000 mg by mouth 2 (two) times daily with a meal.   . nitroGLYCERIN (NITROSTAT) 0.4 MG SL tablet Place 1 tablet (0.4 mg total) under the tongue every 5 (five) minutes as needed for chest pain.     Allergies:   Bactrim [sulfamethoxazole-trimethoprim]   Social History   Tobacco Use  . Smoking status: Former Smoker    Packs/day: 0.50    Years: 11.00    Pack years: 5.50    Types: Cigarettes    Start date: 10/09/2004    Quit date: 05/09/2013    Years since quitting: 6.3  . Smokeless tobacco: Never Used  Substance Use Topics  . Alcohol use: No    Alcohol/week: 0.0 standard drinks  . Drug use: No     Family Hx: The patient's family history includes Diabetes in his mother; Heart attack in his maternal grandfather; Hypertension in his mother; Other in his maternal grandmother; Post-traumatic stress disorder in his father; Sleep apnea in his father; Stomach cancer in his paternal grandmother. There is no history of Colon cancer or Colon polyps.  ROS:   Please see the history of present illness.     All other systems reviewed and are negative.   Prior CV studies:   The following studies were reviewed today:  Reviewed above  Labs/Other Tests and Data Reviewed:    EKG:  No ECG reviewed.  Recent Labs: 08/20/2019: ALT 75; BUN 9; Creatinine 0.94; Hemoglobin 15.6; Platelet Count 263; Potassium 4.3; Sodium 133   Recent Lipid Panel No results found for: CHOL, TRIG, HDL, CHOLHDL, LDLCALC, LDLDIRECT  Wt Readings from Last 3 Encounters:  09/09/19 245 lb (111.1 kg)  09/01/19 247 lb 6.4 oz (112.2 kg)  08/20/19 249 lb 12.8  oz (113.3 kg)     Objective:    Vital Signs:  Ht 6' (1.829 m)   Wt 245 lb (111.1 kg)   BMI 33.23 kg/m    VITAL SIGNS:  reviewed  ASSESSMENT & PLAN:    1. CAD with h/o NSTEMI and LAD drug-eluting stent:Symptomatically stable. Continue aspirin, atorvastatin, lisinopril and carvedilol.   2. Essential hypertension:Controlled at PCP's office. No changes.  3. Hyperlipidemia:Currently on Lipitor 40 mg.   4. Type 2 diabetes: I started metformin 500 mg bid in 12/2014 for newly diagnosed diabetes. It was recently increased to 1000 mg twice daily.  He recently put on some weight.    COVID-19 Education: The signs and symptoms of COVID-19 were discussed with the patient and how to seek care for testing (follow up with PCP or arrange E-visit).  The importance of social distancing  was discussed today.  Time:   Today, I have spent 10 minutes with the patient with telehealth technology discussing the above problems.     Medication Adjustments/Labs and Tests Ordered: Current medicines are reviewed at length with the patient today.  Concerns regarding medicines are outlined above.   Tests Ordered: No orders of the defined types were placed in this encounter.   Medication Changes: No orders of the defined types were placed in this encounter.   Follow Up:  Either In Person or Virtual in 1 year(s)  Signed, Kate Sable, MD  09/09/2019 8:36 AM    [place]

## 2019-09-09 NOTE — Patient Instructions (Signed)

## 2019-09-09 NOTE — Telephone Encounter (Signed)
Pt is aware of the results and plan. I am mailing the lab orders and he will do in [place] and have results faxed to [place].

## 2019-11-04 NOTE — Telephone Encounter (Signed)
Darius Rogers, can we request the disc for patients recent Abdominal US on 05/26/19? Looks like this was completed at [facility].   [name], please remind patient about completing his additional labs. I am going to have to review his [place] with radiology to determine if the hepatic cysts will need further evaluation or surveillance. As this was completed outside of our facility, I am requesting the disc so our radiologists can look at the images themselves.

## 2019-11-05 NOTE — Telephone Encounter (Signed)
Requested disc from PCP.

## 2019-11-05 NOTE — Telephone Encounter (Signed)
PT is aware of the plan. He thought he didn't have to do the labs until March, but he will try to do those soon.  He is aware Cyril Mourning has requested the disc to review with radiology.

## 2019-11-05 NOTE — Telephone Encounter (Signed)
Noted  

## 2019-11-07 ENCOUNTER — Telehealth: Payer: Self-pay | Admitting: Gastroenterology

## 2019-11-07 NOTE — Telephone Encounter (Signed)
I received the disc from [place] and placed in [facility] office

## 2019-11-07 NOTE — Telephone Encounter (Signed)
Noted  

## 2019-11-15 LAB — IRON,TIBC AND FERRITIN PANEL
Ferritin: 364 ng/mL (ref 30–400)
Iron Saturation: 22 % (ref 15–55)
Iron: 85 ug/dL (ref 38–169)
Total Iron Binding Capacity: 389 ug/dL (ref 250–450)
UIBC: 304 ug/dL (ref 111–343)

## 2019-11-15 LAB — HEPATITIS B SURFACE ANTIBODY,QUALITATIVE: Hep B Surface Ab, Qual: NONREACTIVE

## 2019-11-15 LAB — HEPATITIS B CORE ANTIBODY, TOTAL: Hep B Core Total Ab: NEGATIVE

## 2019-11-15 LAB — PROTIME-INR
INR: 1 (ref 0.9–1.2)
Prothrombin Time: 10.6 s (ref 9.1–12.0)

## 2019-11-18 ENCOUNTER — Other Ambulatory Visit: Payer: Self-pay

## 2019-11-18 DIAGNOSIS — Z1159 Encounter for screening for other viral diseases: Secondary | ICD-10-CM

## 2019-11-19 NOTE — Telephone Encounter (Signed)
Error

## 2019-11-19 NOTE — Telephone Encounter (Signed)
Reviewed [place] images with Dr. Thornton Papas. Patient does have multiple liver cyst. All cyst appear to be simple. Nothing concerning. No need to follow.

## 2019-12-26 ENCOUNTER — Other Ambulatory Visit: Payer: Self-pay

## 2019-12-26 ENCOUNTER — Other Ambulatory Visit (HOSPITAL_COMMUNITY)
Admission: RE | Admit: 2019-12-26 | Discharge: 2019-12-26 | Disposition: A | Discharge status: Home / Self Care | Payer: Medicaid Other | Source: Ambulatory Visit | Attending: Gastroenterology | Admitting: Gastroenterology

## 2019-12-26 DIAGNOSIS — Z01812 Encounter for preprocedural laboratory examination: Secondary | ICD-10-CM | POA: Insufficient documentation

## 2019-12-26 DIAGNOSIS — Z20822 Contact with and (suspected) exposure to covid-19: Secondary | ICD-10-CM | POA: Insufficient documentation

## 2019-12-27 LAB — SARS CORONAVIRUS 2 (TAT 6-24 HRS): SARS Coronavirus 2: NEGATIVE

## 2019-12-29 ENCOUNTER — Encounter (HOSPITAL_COMMUNITY)
Admission: RE | Disposition: A | Discharge status: Home / Self Care | Payer: Self-pay | Source: Home / Self Care | Attending: Gastroenterology

## 2019-12-29 ENCOUNTER — Encounter (HOSPITAL_COMMUNITY): Payer: Self-pay | Admitting: Gastroenterology

## 2019-12-29 ENCOUNTER — Ambulatory Visit (HOSPITAL_COMMUNITY)
Admission: RE | Admit: 2019-12-29 | Discharge: 2019-12-29 | Disposition: A | Discharge status: Home / Self Care | Payer: Medicaid Other | Attending: Gastroenterology | Admitting: Gastroenterology

## 2019-12-29 ENCOUNTER — Other Ambulatory Visit: Payer: Self-pay

## 2019-12-29 DIAGNOSIS — Z8489 Family history of other specified conditions: Secondary | ICD-10-CM | POA: Insufficient documentation

## 2019-12-29 DIAGNOSIS — I252 Old myocardial infarction: Secondary | ICD-10-CM | POA: Diagnosis not present

## 2019-12-29 DIAGNOSIS — I10 Essential (primary) hypertension: Secondary | ICD-10-CM | POA: Insufficient documentation

## 2019-12-29 DIAGNOSIS — Z8249 Family history of ischemic heart disease and other diseases of the circulatory system: Secondary | ICD-10-CM | POA: Insufficient documentation

## 2019-12-29 DIAGNOSIS — K635 Polyp of colon: Secondary | ICD-10-CM | POA: Diagnosis not present

## 2019-12-29 DIAGNOSIS — E119 Type 2 diabetes mellitus without complications: Secondary | ICD-10-CM | POA: Diagnosis not present

## 2019-12-29 DIAGNOSIS — Z7982 Long term (current) use of aspirin: Secondary | ICD-10-CM | POA: Insufficient documentation

## 2019-12-29 DIAGNOSIS — M170 Bilateral primary osteoarthritis of knee: Secondary | ICD-10-CM | POA: Insufficient documentation

## 2019-12-29 DIAGNOSIS — E892 Postprocedural hypoparathyroidism: Secondary | ICD-10-CM | POA: Insufficient documentation

## 2019-12-29 DIAGNOSIS — Z1211 Encounter for screening for malignant neoplasm of colon: Secondary | ICD-10-CM | POA: Insufficient documentation

## 2019-12-29 DIAGNOSIS — K644 Residual hemorrhoidal skin tags: Secondary | ICD-10-CM | POA: Insufficient documentation

## 2019-12-29 DIAGNOSIS — Z87891 Personal history of nicotine dependence: Secondary | ICD-10-CM | POA: Insufficient documentation

## 2019-12-29 DIAGNOSIS — Z800 Family history of malignant neoplasm of digestive organs: Secondary | ICD-10-CM | POA: Insufficient documentation

## 2019-12-29 DIAGNOSIS — D122 Benign neoplasm of ascending colon: Secondary | ICD-10-CM | POA: Diagnosis not present

## 2019-12-29 DIAGNOSIS — D124 Benign neoplasm of descending colon: Secondary | ICD-10-CM | POA: Diagnosis not present

## 2019-12-29 DIAGNOSIS — Z882 Allergy status to sulfonamides status: Secondary | ICD-10-CM | POA: Insufficient documentation

## 2019-12-29 DIAGNOSIS — K648 Other hemorrhoids: Secondary | ICD-10-CM | POA: Diagnosis not present

## 2019-12-29 DIAGNOSIS — Z96653 Presence of artificial knee joint, bilateral: Secondary | ICD-10-CM | POA: Insufficient documentation

## 2019-12-29 DIAGNOSIS — Z79899 Other long term (current) drug therapy: Secondary | ICD-10-CM | POA: Insufficient documentation

## 2019-12-29 DIAGNOSIS — E785 Hyperlipidemia, unspecified: Secondary | ICD-10-CM | POA: Diagnosis not present

## 2019-12-29 DIAGNOSIS — Z833 Family history of diabetes mellitus: Secondary | ICD-10-CM | POA: Insufficient documentation

## 2019-12-29 DIAGNOSIS — D125 Benign neoplasm of sigmoid colon: Secondary | ICD-10-CM | POA: Insufficient documentation

## 2019-12-29 DIAGNOSIS — Q438 Other specified congenital malformations of intestine: Secondary | ICD-10-CM | POA: Insufficient documentation

## 2019-12-29 DIAGNOSIS — I2510 Atherosclerotic heart disease of native coronary artery without angina pectoris: Secondary | ICD-10-CM | POA: Insufficient documentation

## 2019-12-29 DIAGNOSIS — Z7984 Long term (current) use of oral hypoglycemic drugs: Secondary | ICD-10-CM | POA: Insufficient documentation

## 2019-12-29 DIAGNOSIS — Z86010 Personal history of colonic polyps: Secondary | ICD-10-CM | POA: Diagnosis not present

## 2019-12-29 DIAGNOSIS — Z955 Presence of coronary angioplasty implant and graft: Secondary | ICD-10-CM | POA: Insufficient documentation

## 2019-12-29 DIAGNOSIS — K5730 Diverticulosis of large intestine without perforation or abscess without bleeding: Secondary | ICD-10-CM | POA: Diagnosis not present

## 2019-12-29 HISTORY — PX: COLONOSCOPY: SHX5424

## 2019-12-29 HISTORY — PX: POLYPECTOMY: SHX5525

## 2019-12-29 LAB — GLUCOSE, CAPILLARY: Glucose-Capillary: 168 mg/dL — ABNORMAL HIGH (ref 70–99)

## 2019-12-29 SURGERY — COLONOSCOPY
Anesthesia: Moderate Sedation

## 2019-12-29 MED ORDER — MIDAZOLAM HCL 5 MG/5ML IJ SOLN
INTRAMUSCULAR | Status: DC | PRN
  Administered 2019-12-29: 2 mg via INTRAVENOUS
  Administered 2019-12-29: 1 mg via INTRAVENOUS
  Administered 2019-12-29: 2 mg via INTRAVENOUS

## 2019-12-29 MED ORDER — SODIUM CHLORIDE 0.9 % IV SOLN: INTRAVENOUS | Status: DC

## 2019-12-29 MED ORDER — STERILE WATER FOR IRRIGATION IR SOLN
Status: DC | PRN
  Administered 2019-12-29: 1.5 mL

## 2019-12-29 MED ORDER — MIDAZOLAM HCL 5 MG/5ML IJ SOLN
INTRAMUSCULAR | Status: AC
  Filled 2019-12-29: qty 10

## 2019-12-29 MED ORDER — MEPERIDINE HCL 100 MG/ML IJ SOLN
INTRAMUSCULAR | Status: DC | PRN
  Administered 2019-12-29 (×2): 25 mg via INTRAVENOUS

## 2019-12-29 MED ORDER — MEPERIDINE HCL 100 MG/ML IJ SOLN
INTRAMUSCULAR | Status: AC
  Filled 2019-12-29: qty 2

## 2019-12-29 NOTE — Op Note (Signed)
[facility] Patient Name: Darius Rogers Procedure Date: 12/29/2019 9:34 AM MRN: [id number] Date of Birth: 08-Mar-1956 Attending MD: Barney Drain MD, MD CSN: [id number] Age: 64 Admit Type: Outpatient Procedure:                Colonoscopy WITH COLD SNARE POLYPECTOMY Indications:              Personal history of colonic polyps Providers:                Barney Drain MD, MD, Otis Peak B. Sharon Seller, RN, Nelma Rothman, Technician Referring MD:             Fuller Canada. Manuella Ghazi MD, MD Medicines:                Meperidine 50 mg IV, Midazolam 5 mg IV Complications:            No immediate complications. Estimated Blood Loss:     Estimated blood loss was minimal. Procedure:                Pre-Anesthesia Assessment:                           - Prior to the procedure, a History and Physical                            was performed, and patient medications and                            allergies were reviewed. The patient's tolerance of                            previous anesthesia was also reviewed. The risks                            and benefits of the procedure and the sedation                            options and risks were discussed with the patient.                            All questions were answered, and informed consent                            was obtained. Prior Anticoagulants: The patient has                            taken no previous anticoagulant or antiplatelet                            agents except for aspirin. ASA Grade Assessment: II                            - A patient with mild systemic disease. After  reviewing the risks and benefits, the patient was                            deemed in satisfactory condition to undergo the                            procedure. After obtaining informed consent, the                            colonoscope was passed under direct vision.                            Throughout the procedure,  the patient's blood                            pressure, pulse, and oxygen saturations were                            monitored continuously. The [id number]                            scope was introduced through the anus and advanced                            to the the cecum, identified by the appendiceal                            orifice, ileocecal valve and palpation. The                            colonoscopy was somewhat difficult due to a                            tortuous colon. Successful completion of the                            procedure was aided by straightening and shortening                            the scope to obtain bowel loop reduction and                            COLOWRAP. The patient tolerated the procedure well.                            The quality of the bowel preparation was good. The                            ileocecal valve, appendiceal orifice, and rectum                            were photographed. Scope In: 9:50:34 AM Scope Out: 10:10:38 AM Scope Withdrawal Time: 0 hours 18 minutes 0 seconds  Total Procedure Duration: 0  hours 20 minutes 4 seconds  Findings:      Three sessile polyps were found in the sigmoid colon, descending colon       and ascending colon. The polyps were 2 to 5 mm in size. These polyps       were removed with a cold snare. Resection and retrieval were complete.      Multiple small and large-mouthed diverticula were found in the       recto-sigmoid colon, sigmoid colon and descending colon.      External and internal hemorrhoids were found.      The recto-sigmoid colon and sigmoid colon were mildly tortuous. Impression:               - Three 2 to 5 mm polyps in the sigmoid colon, in                            the descending colon and in the ascending colon,                            removed with a cold snare. Resected and retrieved.                           - Diverticulosis in the recto-sigmoid colon, in the                             sigmoid colon and in the descending colon.                           - External and internal hemorrhoids.                           - Tortuous colon. Moderate Sedation:      Moderate (conscious) sedation was administered by the endoscopy nurse       and supervised by the endoscopist. The following parameters were       monitored: oxygen saturation, heart rate, blood pressure, and response       to care. Total physician intraservice time was 31 minutes. Recommendation:           - Patient has a contact number available for                            emergencies. The signs and symptoms of potential                            delayed complications were discussed with the                            patient. Return to normal activities tomorrow.                            Written discharge instructions were provided to the                            patient.                           -  High fiber diet.                           - Continue present medications.                           - Await pathology results.                           - Repeat colonoscopy date to be determined after                            pending pathology results are reviewed for                            surveillance. Procedure Code(s):        --- Professional ---                           [phone], Colonoscopy, flexible; with removal of                            tumor(s), polyp(s), or other lesion(s) by snare                            technique                           99153, Moderate sedation; each additional 15                            minutes intraservice time                           G0500, Moderate sedation services provided by the                            same physician or other qualified health care                            professional performing a gastrointestinal                            endoscopic service that sedation supports,                            requiring the  presence of an independent trained                            observer to assist in the monitoring of the                            patient's level of consciousness and physiological                            status; initial 15 minutes of intra-service time;  patient age 53 years or older (additional time may                            be reported with [phone], as appropriate) Diagnosis Code(s):        --- Professional ---                           K63.5, Polyp of colon                           K64.8, Other hemorrhoids                           Z86.010, Personal history of colonic polyps                           K57.30, Diverticulosis of large intestine without                            perforation or abscess without bleeding                           Q43.8, Other specified congenital malformations of                            intestine CPT copyright 2019 [employer]. All rights reserved. The codes documented in this report are preliminary and upon coder review may  be revised to meet current compliance requirements. Barney Drain, MD Barney Drain MD, MD 12/29/2019 10:27:07 AM This report has been signed electronically. Number of Addenda: 0

## 2019-12-29 NOTE — Discharge Instructions (Signed)
You have internal and external hemorrhoids and diverticulosis IN YOUR LEFT COLON. YOU HAD THREE SMALL POLYPS REMOVED.    EAT TO LIVE AND THINK OF FOOD AS MEDICINE. 75% OF YOUR PLATE SHOULD BE FRUITS/VEGGIES.  To have more energy, control your blood sugar, and to lose weight:      1. CONTINUE YOUR WEIGHT LOSS EFFORTS. I RECOMMEND YOU READ AND FOLLOW RECOMMENDATIONS BY DR. MARK HYMAN, "10-DAY DETOX DIET".    2. If you must eat bread, EAT EZEKIEL BREAD. IT IS IN THE FROZEN SECTION OF THE GROCERY STORE.    3. DRINK WATER WITH FRUIT OR CUCUMBER ADDED. YOUR URINE SHOULD BE LIGHT YELLOW. AVOID SODA, GATORADE, ENERGY DRINKS, OR DIET SODA.     4. AVOID HIGH FRUCTOSE CORN SYRUP AND CAFFEINE.     5. DO NOT chew SUGAR FREE GUM OR USE ARTIFICIAL SWEETENERS. IF NEEDED USE STEVIA AS A SWEETENER.    6. DO NOT EAT ENRICHED WHEAT FLOUR, PASTA, RICE, OR CEREAL.    7. ONLY EAT WILD CAUGHT SEAFOOD, GRASS FED BEEF OR CHICKEN, PORK FROM PASTURE RAISE PIGS, OR EGGS FROM PASTURE RAISED CHICKENS.    8. START TAKING A MULTIVITAMIN, VITAMIN B12, AND VITAMIN D3 2000 IU DAILY.   ADDITIONAL SUPPLEMENTS TO DECREASE CRAVING AND SUPPRESS YOUR APPETITE:    1. CINNAMON 500 MG EVERY AM PRIOR TO FIRST MEAL.   **STABILIZES BLOOD GLUCOSE/REDUCES CRAVINGS**    2. CHROMIUM 400-500 MG WITH MEALS TWICE DAILY.    **FAT BURNER**    3. GREEN TEA EXTRACT ONE DAILY.   **FAT BURNER/SUPPRESSES YOUR APPETITE**    4. ALPHA LIPOIC ACID TWICE DAILY.   **NATURAL ANTI-INFLAMMATORY SUPPLEMENT THAT IS AN ALTERNATIVE TO IBUPROFEN OR NAPROXEN**   USE PREPARATION H FOUR TIMES  A DAY IF NEEDED TO RELIEVE RECTAL PAIN/PRESSURE/BLEEDING.   YOUR BIOPSY RESULTS WILL BE BACK IN 5 BUSINESS DAYS.  YOUR next colonoscopy WILL BE SCHEDULED BASED ON YOUR FINAL PATHOLOGY REPORT.    Colonoscopy Care After Read the instructions outlined below and refer to this sheet in the next week. These discharge instructions provide you with general  information on caring for yourself after you leave the hospital. While your treatment has been planned according to the most current medical practices available, unavoidable complications occasionally occur. If you have any problems or questions after discharge, call DR. Caress Reffitt, [phone].  ACTIVITY  You may resume your regular activity, but move at a slower pace for the next 24 hours.   Take frequent rest periods for the next 24 hours.   Walking will help get rid of the air and reduce the bloated feeling in your belly (abdomen).   No driving for 24 hours (because of the medicine (anesthesia) used during the test).   You may shower.   Do not sign any important legal documents or operate any machinery for 24 hours (because of the anesthesia used during the test).    NUTRITION  Drink plenty of fluids.   You may resume your normal diet as instructed by your doctor.   Begin with a light meal and progress to your normal diet. Heavy or fried foods are harder to digest and may make you feel sick to your stomach (nauseated).   Avoid alcoholic beverages for 24 hours or as instructed.    MEDICATIONS  You may resume your normal medications.   WHAT YOU CAN EXPECT TODAY  Some feelings of bloating in the abdomen.   Passage of more gas than usual.   Spotting  of blood in your stool or on the toilet paper  .  IF YOU HAD POLYPS REMOVED DURING THE COLONOSCOPY:  Eat a soft diet IF YOU HAVE NAUSEA, BLOATING, ABDOMINAL PAIN, OR VOMITING.    FINDING OUT THE RESULTS OF YOUR TEST Not all test results are available during your visit. DR. Oneida Alar WILL CALL YOU WITHIN 14 DAYS OF YOUR PROCEDUE WITH YOUR RESULTS. Do not assume everything is normal if you have not heard from DR. Francisca Langenderfer, CALL HER OFFICE AT [phone].  SEEK IMMEDIATE MEDICAL ATTENTION AND CALL THE OFFICE: [phone] IF:  You have more than a spotting of blood in your stool.   Your belly is swollen (abdominal distention).     You are nauseated or vomiting.   You have a temperature over 101F.   You have abdominal pain or discomfort that is severe or gets worse throughout the day.  High-Fiber Diet A high-fiber diet changes your normal diet to include more whole grains, legumes, fruits, and vegetables. Changes in the diet involve replacing refined carbohydrates with unrefined foods. The calorie level of the diet is essentially unchanged. The Dietary Reference Intake (recommended amount) for adult males is 38 grams per day. For adult females, it is 25 grams per day. Pregnant and lactating women should consume 28 grams of fiber per day. Fiber is the intact part of a plant that is not broken down during digestion. Functional fiber is fiber that has been isolated from the plant to provide a beneficial effect in the body.  PURPOSE  Increase stool bulk.   Ease and regulate bowel movements.   Lower cholesterol.   REDUCE RISK OF COLON CANCER  INDICATIONS THAT YOU NEED MORE FIBER  Constipation and hemorrhoids.   Uncomplicated diverticulosis (intestine condition) and irritable bowel syndrome.   Weight management.   As a protective measure against hardening of the arteries (atherosclerosis), diabetes, and cancer.   GUIDELINES FOR INCREASING FIBER IN THE DIET  Start adding fiber to the diet slowly. A gradual increase of about 5 more grams (2 servings of most fruits or vegetables) per day is best. Too rapid an increase in fiber may result in constipation, flatulence, and bloating.   Drink enough water and fluids to keep your urine clear or pale yellow. Water, juice, or caffeine-free drinks are recommended. Not drinking enough fluid may cause constipation.   Eat a variety of high-fiber foods rather than one type of fiber.   Try to increase your intake of fiber through using high-fiber foods rather than fiber pills or supplements that contain small amounts of fiber.   The goal is to change the types of food  eaten. Do not supplement your present diet with high-fiber foods, but replace foods in your present diet.    Polyps, Colon  A polyp is extra tissue that grows inside your body. Colon polyps grow in the large intestine. The large intestine, also called the colon, is part of your digestive system. It is a long, hollow tube at the end of your digestive tract where your body makes and stores stool. Most polyps are not dangerous. They are benign. This means they are not cancerous. But over time, some types of polyps can turn into cancer. Polyps that are smaller than a pea are usually not harmful. But larger polyps could someday become or may already be cancerous. To be safe, doctors remove all polyps and test them.   PREVENTION There is not one sure way to prevent polyps. You might be able to  lower your risk of getting them if you:  Eat more fruits and vegetables and less fatty food.   Do not smoke.   Avoid alcohol.   Exercise every day.   Lose weight if you are overweight.   Eating more calcium and folate can also lower your risk of getting polyps. Some foods that are rich in calcium are milk, cheese, and broccoli. Some foods that are rich in folate are chickpeas, kidney beans, and spinach.    Diverticulosis Diverticulosis is a common condition that develops when small pouches (diverticula) form in the wall of the colon. The risk of diverticulosis increases with age. It happens more often in people who eat a low-fiber diet. Most individuals with diverticulosis have no symptoms. Those individuals with symptoms usually experience belly (abdominal) pain, constipation, or loose stools (diarrhea).  HOME CARE INSTRUCTIONS  Increase the amount of fiber in your diet as directed by your caregiver or dietician. This may reduce symptoms of diverticulosis.   Drink at least 6 to 8 glasses of water each day to prevent constipation.   Try not to strain when you have a bowel movement.   Avoiding nuts  and seeds to prevent complications is NOT NECESSARY.   FOODS HAVING HIGH FIBER CONTENT INCLUDE:  Fruits. Apple, peach, pear, tangerine, raisins, prunes.   Vegetables. Brussels sprouts, asparagus, broccoli, cabbage, carrot, cauliflower, romaine lettuce, spinach, summer squash, tomato, winter squash, zucchini.   Starchy Vegetables. Baked beans, kidney beans, lima beans, split peas, lentils, potatoes (with skin).   SEEK IMMEDIATE MEDICAL CARE IF:  You develop increasing pain or severe bloating.   You have an oral temperature above 101F.   You develop vomiting or bowel movements that are bloody or black.

## 2019-12-29 NOTE — H&P (Signed)
Primary Care Physician:  Monico Blitz, MD Primary Gastroenterologist:  Dr. Oneida Alar  Pre-Procedure History & Physical: HPI:  Darius Rogers is a 64 y.o. male here for  PERSONAL HISTORY OF POLYPS.  Past Medical History:  Diagnosis Date  . Arthritis    "knees" (11/13/2017)  . CAD (coronary artery disease)   . Constipation   . Heart attack ([place]) 2014   DES in LAD  . Hyperlipemia   . Hypertension   . Type II diabetes mellitus ([place])     Past Surgical History:  Procedure Laterality Date  . COLONOSCOPY    . CORONARY ANGIOPLASTY WITH STENT PLACEMENT  2014  . CYSTOSCOPY WITH INSERTION OF UROLIFT N/A 03/11/2018   Procedure: CYSTOSCOPY WITH INSERTION OF UROLIFT;  Surgeon: Cleon Gustin, MD;  Location: [facility];  Service: Urology;  Laterality: N/A;  . FOREARM FRACTURE SURGERY Right 1975  . FRACTURE SURGERY Right 1975   R arm   . JOINT REPLACEMENT    . PAROTIDECTOMY Right 02/19/2018   Procedure: RIGHT PAROTIDECTOMY;  Surgeon: [name], MD;  Location: [facility];  Service: ENT;  Laterality: Right;  RNFA  . TOTAL KNEE ARTHROPLASTY Right 05/08/2017   Procedure: RIGHT TOTAL KNEE ARTHROPLASTY;  Surgeon: Garald Balding, MD;  Location: [facility];  Service: Orthopedics;  Laterality: Right;  . TOTAL KNEE ARTHROPLASTY Left 11/13/2017  . TOTAL KNEE ARTHROPLASTY Left 11/13/2017   Procedure: LEFT TOTAL KNEE ARTHROPLASTY;  Surgeon: Garald Balding, MD;  Location: [facility];  Service: Orthopedics;  Laterality: Left;    Prior to Admission medications   Medication Sig Start Date End Date Taking? Authorizing Provider  aspirin EC 81 MG tablet Take 81 mg by mouth daily.   Yes [provider]  atorvastatin (LIPITOR) 40 MG tablet TAKE 1 TABLET BY MOUTH ONCE DAILY AT NIGHT Patient taking differently: Take 40 mg by mouth at bedtime.  06/21/18  Yes Herminio Commons, MD  bisacodyl (BISACODYL) 5 MG EC tablet Take 10 mg by mouth at bedtime.   Yes [provider]  carvedilol (COREG)  6.25 MG tablet Take 6.25 mg by mouth 2 (two) times daily. 06/01/19  Yes [provider]  dapagliflozin propanediol (FARXIGA) 5 MG TABS tablet Take 5 mg by mouth daily.   Yes [provider]  linaclotide Rolan Lipa) 145 MCG CAPS capsule Take 1 capsule (145 mcg total) by mouth daily before breakfast. 09/01/19  Yes Aliene Altes S, PA-C  lisinopril (ZESTRIL) 10 MG tablet Take 10 mg by mouth at bedtime.   Yes [provider]  loratadine (CLARITIN) 10 MG tablet Take 10 mg by mouth at bedtime.    Yes [provider]  metFORMIN (GLUCOPHAGE) 1000 MG tablet Take 1,000 mg by mouth in the morning and at bedtime.   Yes [provider]  lisinopril (ZESTRIL) 20 MG tablet Take 1 tablet by mouth once daily Patient not taking: Reported on 12/22/2019 06/09/19   Arnoldo Lenis, MD  nitroGLYCERIN (NITROSTAT) 0.4 MG SL tablet Place 1 tablet (0.4 mg total) under the tongue every 5 (five) minutes as needed for chest pain. 04/24/18 09/08/20  Herminio Commons, MD    Allergies as of 09/08/2019 - Review Complete 09/01/2019  Allergen Reaction Noted  . Bactrim [sulfamethoxazole-trimethoprim] Hives 02/12/2018    Family History  Problem Relation Age of Onset  . Hypertension Mother   . Diabetes Mother   . Post-traumatic stress disorder Father   . Sleep apnea Father   . Other Maternal Grandmother  PPM  . Heart attack Maternal Grandfather   . Stomach cancer Paternal Grandmother   . Colon cancer Neg Hx   . Colon polyps Neg Hx     Social History   Socioeconomic History  . Marital status: Divorced    Spouse name: Not on file  . Number of children: Not on file  . Years of education: Not on file  . Highest education level: Not on file  Occupational History  . Not on file  Tobacco Use  . Smoking status: Former Smoker    Packs/day: 0.50    Years: 11.00    Pack years: 5.50    Types: Cigarettes    Start date: 10/09/2004    Quit date: 05/09/2013    Years since  quitting: 6.6  . Smokeless tobacco: Never Used  Substance and Sexual Activity  . Alcohol use: No    Alcohol/week: 0.0 standard drinks  . Drug use: No  . Sexual activity: Not Currently  Other Topics Concern  . Not on file  Social History Narrative  . Not on file   Social Determinants of Health   Financial Resource Strain:   . Difficulty of Paying Living Expenses:   Food Insecurity:   . Worried About Charity fundraiser in the Last Year:   . Arboriculturist in the Last Year:   Transportation Needs:   . Film/video editor (Medical):   Marland Kitchen Lack of Transportation (Non-Medical):   Physical Activity:   . Days of Exercise per Week:   . Minutes of Exercise per Session:   Stress:   . Feeling of Stress :   Social Connections:   . Frequency of Communication with Friends and Family:   . Frequency of Social Gatherings with Friends and Family:   . Attends Religious Services:   . Active Member of Clubs or Organizations:   . Attends Archivist Meetings:   Marland Kitchen Marital Status:   Intimate Partner Violence:   . Fear of Current or Ex-Partner:   . Emotionally Abused:   Marland Kitchen Physically Abused:   . Sexually Abused:     Review of Systems: See HPI, otherwise negative ROS   Physical Exam: BP 137/84   Pulse 80   Temp 98.4 F (36.9 C) (Oral)   Resp 19   Ht 5\' 11"  (1.803 m)   SpO2 96%   BMI 34.17 kg/m  General:   Alert,  pleasant and cooperative in NAD Head:  Normocephalic and atraumatic. Neck:  Supple; Lungs:  Clear throughout to auscultation.    Heart:  Regular rate and rhythm. Abdomen:  Soft, nontender and nondistended. Normal bowel sounds, without guarding, and without rebound.   Neurologic:  Alert and  oriented x4;  grossly normal neurologically.  Impression/Plan:      PERSONAL HISTORY OF POLYPS.  PLAN: 1. TCS TODAY. DISCUSSED PROCEDURE, BENEFITS, & RISKS: < 1% chance of medication reaction, bleeding, perforation, ASPIRATION, or rupture of spleen/liver requiring  surgery to fix it and missed polyps < 1 cm 10-20% of the time.

## 2019-12-30 LAB — SURGICAL PATHOLOGY

## 2020-01-01 ENCOUNTER — Telehealth: Payer: Self-pay | Admitting: Gastroenterology

## 2020-01-01 NOTE — Telephone Encounter (Signed)
REMINDER IN EPIC °

## 2020-01-01 NOTE — Telephone Encounter (Signed)
Called pt and notified him that  He had TWO simple adenomas and one BENIGN POLYPOID TISSUE Removed and to FOLLOW A High fiber diet. Next colonoscopy AS EARLY AS MAR 2026 AND BUT NO LATER THAN MAR 2028. Notified him that our office will mark it in our system and notify him just incase he forgets. Pt stated he understood and thanked me for the call

## 2020-01-01 NOTE — Telephone Encounter (Signed)
Please call pt. He had TWO simple adenomas and one BENIGN POLYPOID TISSUE removed. FOLLOW A High fiber diet. Next colonoscopy AS EARLY AS MAR 2026 AND BUT NO LATER THAN MAR 2028.

## 2020-02-17 ENCOUNTER — Inpatient Hospital Stay: Payer: Medicaid Other | Admitting: Hematology & Oncology

## 2020-02-17 ENCOUNTER — Inpatient Hospital Stay: Payer: Medicaid Other | Attending: Hematology & Oncology

## 2020-02-17 ENCOUNTER — Ambulatory Visit (HOSPITAL_BASED_OUTPATIENT_CLINIC_OR_DEPARTMENT_OTHER): Payer: Medicaid Other

## 2020-07-28 ENCOUNTER — Other Ambulatory Visit: Payer: Self-pay | Admitting: Gastroenterology

## 2020-07-28 DIAGNOSIS — K5900 Constipation, unspecified: Secondary | ICD-10-CM

## 2020-09-27 NOTE — Progress Notes (Signed)
Cardiology Office Note  Date: 09/28/2020   ID: Darius Rogers, DOB [birth year], MRN [id number]  PCP:  Monico Blitz, MD  Cardiologist:  No primary care provider on file. Electrophysiologist:  None   Chief Complaint: Cardiac follow up  History of Present Illness: Darius Rogers is a 64 y.o. male with a history of CAD/MI, HTN, HLD, DM2, tobacco abuse, obesity.  Last encounter with Dr. Bronson Ing 09/09/2019 via telemedicine. History of NSTEMI with PCI/DES to LAD 2014. Echocardiogram at that time demonstrated normal LV systolic and diastolic function with LVEF of 55%. Cardiac catheterization 05/05/2013 95% stenosis of LAD with mild luminal irregularities left circumflex and mid RCA stenosis of 20%. Denied any symptoms of chest pain, palpitations, shortness of breath, lightheadedness, dizziness, leg swelling, orthopnea, PND. His CAD was symptomatically stable. He was to continue aspirin, atorvastatin, lisinopril, carvedilol. Blood pressure was controlled at PCP office. Currently on Lipitor 40 mg daily. He was taking Metformin 1000 mg p.o. twice daily for type 2 diabetes. He had recently put on some weight.  Patient is here for 1 year follow-up.  He denies any recent acute illnesses or hospitalizations in the interim since last visit.  States he is recently had some left shoulder/arm pain.  States he notices the pain at rest and when he is attempting to lie on his left side when sleeping.  He has been taking nonsteroidal anti-inflammatory medications i.e. Tylenol for pain relief.  He denies any associated radiation to neck, arm, back, jaw.  Denies any associated nausea, vomiting, diaphoresis.  No recent anginal symptoms per his statement.  He states he is not very active on a daily basis.  States he recently saw his primary care provider who performed lab work and states that all the lab work was normal.  Blood pressure is elevated on arrival today.  Patient states normally his blood pressure is within  normal limits at home.  He denies any CVA or TIA-like symptoms, orthostatic symptoms, palpitations or arrhythmias, PND, orthopnea, bleeding, claudication, DVT or PE-like symptoms, lower extremity edema.  EKG today shows normal sinus rhythm rate of 67, nonspecific T wave abnormality.   Past Medical History:  Diagnosis Date  . Arthritis    "knees" (11/13/2017)  . CAD (coronary artery disease)   . Constipation   . Heart attack ([place]) 2014   DES in LAD  . Hyperlipemia   . Hypertension   . Type II diabetes mellitus ([place])     Past Surgical History:  Procedure Laterality Date  . COLONOSCOPY    . COLONOSCOPY N/A 12/29/2019   Procedure: COLONOSCOPY;  Surgeon: Danie Binder, MD;  [facility];  Service: Endoscopy;  Laterality: N/A;  9:30am  . CORONARY ANGIOPLASTY WITH STENT PLACEMENT  2014  . CYSTOSCOPY WITH INSERTION OF UROLIFT N/A 03/11/2018   Procedure: CYSTOSCOPY WITH INSERTION OF UROLIFT;  Surgeon: Cleon Gustin, MD;  [facility];  Service: Urology;  Laterality: N/A;  . FOREARM FRACTURE SURGERY Right 1975  . FRACTURE SURGERY Right 1975   R arm   . JOINT REPLACEMENT    . PAROTIDECTOMY Right 02/19/2018   Procedure: RIGHT PAROTIDECTOMY;  Surgeon: [name], MD;  Location: [facility];  Service: ENT;  Laterality: Right;  RNFA  . POLYPECTOMY  12/29/2019   Procedure: POLYPECTOMY;  Surgeon: Danie Binder, MD;  [facility];  Service: Endoscopy;;  . TOTAL KNEE ARTHROPLASTY Right 05/08/2017   Procedure: RIGHT TOTAL KNEE ARTHROPLASTY;  Surgeon: Garald Balding, MD;  Location: [facility];  Service: Orthopedics;  Laterality: Right;  . TOTAL KNEE ARTHROPLASTY Left 11/13/2017  . TOTAL KNEE ARTHROPLASTY Left 11/13/2017   Procedure: LEFT TOTAL KNEE ARTHROPLASTY;  Surgeon: Garald Balding, MD;  Location: [facility];  Service: Orthopedics;  Laterality: Left;    Current Outpatient Medications  Medication Sig Dispense Refill  . aspirin EC 81 MG tablet Take 81 mg by  mouth daily.    Marland Kitchen atorvastatin (LIPITOR) 40 MG tablet TAKE 1 TABLET BY MOUTH ONCE DAILY AT NIGHT (Patient taking differently: Take 40 mg by mouth at bedtime.) 90 tablet 3  . bisacodyl (BISACODYL) 5 MG EC tablet Take 10 mg by mouth at bedtime.    . carvedilol (COREG) 6.25 MG tablet Take 6.25 mg by mouth 2 (two) times daily.    . dapagliflozin propanediol (FARXIGA) 5 MG TABS tablet Take 5 mg by mouth daily.    Marland Kitchen LINZESS 145 MCG CAPS capsule TAKE 1 CAPSULE BY MOUTH ONCE DAILY BEFORE BREAKFAST 90 capsule 3  . lisinopril (ZESTRIL) 10 MG tablet Take 10 mg by mouth at bedtime.    Marland Kitchen loratadine (CLARITIN) 10 MG tablet Take 10 mg by mouth at bedtime.    . metFORMIN (GLUCOPHAGE) 1000 MG tablet Take 1,000 mg by mouth in the morning and at bedtime.    . nitroGLYCERIN (NITROSTAT) 0.4 MG SL tablet Place 0.4 mg under the tongue every 5 (five) minutes x 3 doses as needed for chest pain (if no relief after 2nd dose, proceed to the ED for an evaluation or call 911).     No current facility-administered medications for this visit.   Allergies:  Bactrim [sulfamethoxazole-trimethoprim]   Social History: The patient  reports that he quit smoking about 7 years ago. His smoking use included cigarettes. He started smoking about 15 years ago. He has a 5.50 pack-year smoking history. He has never used smokeless tobacco. He reports that he does not drink alcohol and does not use drugs.   Family History: The patient's family history includes Diabetes in his mother; Heart attack in his maternal grandfather; Hypertension in his mother; Other in his maternal grandmother; Post-traumatic stress disorder in his father; Sleep apnea in his father; Stomach cancer in his paternal grandmother.   ROS:  Please see the history of present illness. Otherwise, complete review of systems is positive for none.  All other systems are reviewed and negative.   Physical Exam: VS:  BP (!) 146/82   Pulse 70   Ht 6' (1.829 m)   Wt 237 lb 9.6 oz  (107.8 kg)   SpO2 97%   BMI 32.22 kg/m , BMI Body mass index is 32.22 kg/m.  Wt Readings from Last 3 Encounters:  09/28/20 237 lb 9.6 oz (107.8 kg)  09/09/19 245 lb (111.1 kg)  09/01/19 247 lb 6.4 oz (112.2 kg)    General: Patient appears comfortable at rest. Neck: Supple, no elevated JVP or carotid bruits, no thyromegaly. Lungs: Clear to auscultation, nonlabored breathing at rest. Cardiac: Regular rate and rhythm, no S3 or significant systolic murmur, no pericardial rub. Extremities: No pitting edema, distal pulses 2+. Skin: Warm and dry. Musculoskeletal: No kyphosis. Neuropsychiatric: Alert and oriented x3, affect grossly appropriate.  ECG:  An ECG dated 09/28/2020 was personally reviewed today and demonstrated:  Normal sinus rhythm rate of 67, nonspecific T wave abnormality.  Recent Labwork: No results found for requested labs within last 8760 hours.  No results found for: CHOL, TRIG, HDL, CHOLHDL, VLDL, LDLCALC, LDLDIRECT  Other  Studies Reviewed Today:  Echocardiogram 05/01/2013: LV size normal. Systolic function was normal. Ejection fraction was estimated to be 55%. No regional wall motion abnormalities. Wall thickness was increased. Changes consistent with concentric remodeling (increased wall thickness with normal wall mass). Left ventricular diastolic function parameters were normal for the patient's age. No aortic valve regurgitation. No mitral valve regurgitation. No tricuspid valve regurgitation.   Assessment and Plan:  1. CAD in native artery   2. Essential hypertension   3. Type 2 diabetes mellitus without complication, without long-term current use of insulin ([place])   4. Hyperlipidemia LDL goal <70    1. CAD in native artery No recent anginal or exertional symptoms.  Continue aspirin 81 mg daily.  Nitroglycerin sublingual as needed.  Continue carvedilol 6.25 mg p.o. twice daily.  2. Essential hypertension Blood pressure elevated on arrival today.  Patient states  normally his blood pressure is good at home.  Continue carvedilol 6.25 mg p.o. twice daily.  Continue lisinopril 10 mg p.o. daily.  3. Type 2 diabetes mellitus without complication, without long-term current use of insulin ([place]) Follows with PCP for diabetes   4. Hyperlipidemia LDL goal <70 Continue atorvastatin 40 mg p.o. daily.  Medication Adjustments/Labs and Tests Ordered: Current medicines are reviewed at length with the patient today.  Concerns regarding medicines are outlined above.   Disposition: Follow-up with Dr. Harl Bowie or APP 1 year.  Signed, Levell July, NP 09/28/2020 4:01 PM    [facility] [place], [place], [place] [address] Phone: [phone]; Fax: [phone]

## 2020-09-28 ENCOUNTER — Encounter: Payer: Self-pay | Admitting: Family Medicine

## 2020-09-28 ENCOUNTER — Ambulatory Visit (INDEPENDENT_AMBULATORY_CARE_PROVIDER_SITE_OTHER): Payer: Medicaid Other | Admitting: Family Medicine

## 2020-09-28 VITALS — BP 146/82 | HR 70 | Ht 72.0 in | Wt 237.6 lb

## 2020-09-28 DIAGNOSIS — I2510 Atherosclerotic heart disease of native coronary artery without angina pectoris: Secondary | ICD-10-CM | POA: Diagnosis not present

## 2020-09-28 DIAGNOSIS — E785 Hyperlipidemia, unspecified: Secondary | ICD-10-CM

## 2020-09-28 DIAGNOSIS — I10 Essential (primary) hypertension: Secondary | ICD-10-CM

## 2020-09-28 DIAGNOSIS — E119 Type 2 diabetes mellitus without complications: Secondary | ICD-10-CM | POA: Diagnosis not present

## 2020-09-28 NOTE — Patient Instructions (Addendum)

## 2021-10-04 ENCOUNTER — Other Ambulatory Visit: Payer: Self-pay

## 2021-10-04 ENCOUNTER — Encounter: Payer: Self-pay | Admitting: Internal Medicine

## 2021-10-04 ENCOUNTER — Ambulatory Visit (INDEPENDENT_AMBULATORY_CARE_PROVIDER_SITE_OTHER): Payer: Medicaid Other | Admitting: Internal Medicine

## 2021-10-04 VITALS — BP 130/72 | HR 70 | Ht 72.0 in | Wt 234.0 lb

## 2021-10-04 DIAGNOSIS — I10 Essential (primary) hypertension: Secondary | ICD-10-CM

## 2021-10-04 DIAGNOSIS — E785 Hyperlipidemia, unspecified: Secondary | ICD-10-CM

## 2021-10-04 DIAGNOSIS — E119 Type 2 diabetes mellitus without complications: Secondary | ICD-10-CM

## 2021-10-04 DIAGNOSIS — I2510 Atherosclerotic heart disease of native coronary artery without angina pectoris: Secondary | ICD-10-CM | POA: Diagnosis not present

## 2021-10-04 NOTE — Progress Notes (Signed)
Cardiology Office Note  Date: 10/04/2021   ID: Darius Rogers, DOB 1956/07/03, MRN [id number]  PCP:  Darius Blitz, MD  Cardiologist:  None Electrophysiologist:  None   Chief Complaint: Cardiac follow up  History of Present Illness: Darius Rogers is a 65 y.o. male with a history of CAD/MI, HTN, HLD, DM2, tobacco abuse, obesity.  Last encounter with Dr. Bronson Rogers 09/09/2019 via telemedicine. History of NSTEMI with PCI/DES to LAD 2014. Echocardiogram at that time demonstrated normal LV systolic and diastolic function with LVEF of 55%. Cardiac catheterization 05/05/2013 95% stenosis of LAD with mild luminal irregularities left circumflex and mid RCA stenosis of 20%. Denied any symptoms of chest pain, palpitations, shortness of breath, lightheadedness, dizziness, leg swelling, orthopnea, PND. His CAD was symptomatically stable. He was to continue aspirin, atorvastatin, lisinopril, carvedilol. Blood pressure was controlled at PCP office. Currently on Lipitor 40 mg daily. He was taking Metformin 1000 mg p.o. twice daily for type 2 diabetes. He had recently put on some weight.  Patient is here for 1 year follow-up.  He denies any recent acute illnesses or hospitalizations in the interim since last visit.  States he is recently had some left shoulder/arm pain.  States he notices the pain at rest and when he is attempting to lie on his left side when sleeping.  He has been taking nonsteroidal anti-inflammatory medications i.e. Tylenol for pain relief.  He denies any associated radiation to neck, arm, back, jaw.  Denies any associated nausea, vomiting, diaphoresis.  No recent anginal symptoms per his statement.  He states he is not very active on a daily basis.  States he recently saw his primary care provider who performed lab work and states that all the lab work was normal.  Blood pressure is elevated on arrival today.  Patient states normally his blood pressure is within normal limits at home.  He denies  any CVA or TIA-like symptoms, orthostatic symptoms, palpitations or arrhythmias, PND, orthopnea, bleeding, claudication, DVT or PE-like symptoms, lower extremity edema.  EKG today shows normal sinus rhythm rate of 67, nonspecific T wave abnormality.  10/04/2021  Mr. Care is seen today for annual follow-up.  He was last seen by Darius July, NP and now is establishing care with me.  He has a history of remote NSTEMI with PCI to the LAD in 2014 in [place].  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  He has since been living here and doing well.  Denies any worsening symptoms or recurrent chest pain.  Blood pressures been well controlled.  He is lost a little weight.  His labs are followed closely by his PCP which we will need to obtain.  He was told recently that his cholesterol was little higher than desired.   Past Medical History:  Diagnosis Date   Arthritis    "knees" (11/13/2017)   CAD (coronary artery disease)    Constipation    Heart attack ([place]) 2014   DES in LAD   Hyperlipemia    Hypertension    Type II diabetes mellitus ([place])     Past Surgical History:  Procedure Laterality Date   COLONOSCOPY     COLONOSCOPY N/A 12/29/2019   Procedure: COLONOSCOPY;  Surgeon: Darius Binder, MD;  [facility];  Service: Endoscopy;  Laterality: N/A;  9:30am   CORONARY ANGIOPLASTY WITH STENT PLACEMENT  2014   CYSTOSCOPY WITH  INSERTION OF UROLIFT N/A 03/11/2018   Procedure: CYSTOSCOPY WITH INSERTION OF UROLIFT;  Surgeon: Darius Gustin, MD;  [facility];  Service: Urology;  Laterality: N/A;   FOREARM FRACTURE SURGERY Right 1975   FRACTURE SURGERY Right 1975   R arm    JOINT REPLACEMENT     PAROTIDECTOMY Right 02/19/2018   Procedure: RIGHT PAROTIDECTOMY;  Surgeon: Darius Baptist, MD;  Location: [facility];  Service: ENT;  Laterality: Right;  RNFA   POLYPECTOMY  12/29/2019    Procedure: POLYPECTOMY;  Surgeon: Darius Binder, MD;  [facility];  Service: Endoscopy;;   TOTAL KNEE ARTHROPLASTY Right 05/08/2017   Procedure: RIGHT TOTAL KNEE ARTHROPLASTY;  Surgeon: Darius Balding, MD;  Location: [facility];  Service: Orthopedics;  Laterality: Right;   TOTAL KNEE ARTHROPLASTY Left 11/13/2017   TOTAL KNEE ARTHROPLASTY Left 11/13/2017   Procedure: LEFT TOTAL KNEE ARTHROPLASTY;  Surgeon: Darius Balding, MD;  Location: [facility];  Service: Orthopedics;  Laterality: Left;    Current Outpatient Medications  Medication Sig Dispense Refill   aspirin EC 81 MG tablet Take 81 mg by mouth daily.     atorvastatin (LIPITOR) 40 MG tablet TAKE 1 TABLET BY MOUTH ONCE DAILY AT NIGHT (Patient taking differently: Take 40 mg by mouth at bedtime.) 90 tablet 3   bisacodyl (BISACODYL) 5 MG EC tablet Take 10 mg by mouth at bedtime.     carvedilol (COREG) 6.25 MG tablet Take 6.25 mg by mouth 2 (two) times daily.     dapagliflozin propanediol (FARXIGA) 5 MG TABS tablet Take 5 mg by mouth daily.     LINZESS 145 MCG CAPS capsule TAKE 1 CAPSULE BY MOUTH ONCE DAILY BEFORE BREAKFAST 90 capsule 3   lisinopril (ZESTRIL) 10 MG tablet Take 10 mg by mouth at bedtime.     loratadine (CLARITIN) 10 MG tablet Take 10 mg by mouth at bedtime.     metFORMIN (GLUCOPHAGE) 1000 MG tablet Take 1,000 mg by mouth in the morning and at bedtime.     nitroGLYCERIN (NITROSTAT) 0.4 MG SL tablet Place 0.4 mg under the tongue every 5 (five) minutes x 3 doses as needed for chest pain (if no relief after 2nd dose, proceed to the ED for an evaluation or call 911).     No current facility-administered medications for this visit.   Allergies:  Bactrim [sulfamethoxazole-trimethoprim]   Social History: The patient  reports that he quit smoking about 8 years ago. His smoking use included cigarettes. He started smoking about 16 years ago. He has a 5.50 pack-year smoking history. He has never used smokeless tobacco. He reports  that he does not drink alcohol and does not use drugs.   Family History: The patient's family history includes Diabetes in his mother; Heart attack in his maternal grandfather; Hypertension in his mother; Other in his maternal grandmother; Post-traumatic stress disorder in his father; Sleep apnea in his father; Stomach cancer in his paternal grandmother.   ROS:  Please see the history of present illness. Otherwise, complete review of systems is positive for none.  All other systems are reviewed and negative.   Physical Exam: VS:  BP 130/72    Pulse 70    Ht 6' (1.829 m)    Wt 234 lb (106.1 kg)    SpO2 96%    BMI 31.74 kg/m , BMI Body mass index is 31.74 kg/m.  Wt Readings from Last 3 Encounters:  10/04/21 234 lb (106.1 kg)  09/28/20  237 lb 9.6 oz (107.8 kg)  09/09/19 245 lb (111.1 kg)    General appearance: alert and no distress Neck: no carotid bruit, no JVD, and thyroid not enlarged, symmetric, no tenderness/mass/nodules Lungs: clear to auscultation bilaterally Heart: regular rate and rhythm, S1, S2 normal, no murmur, click, rub or gallop Abdomen: soft, non-tender; bowel sounds normal; no masses,  no organomegaly Extremities: extremities normal, atraumatic, no cyanosis or edema Pulses: 2+ and symmetric Skin: Skin color, texture, turgor normal. No rashes or lesions Neurologic: Grossly normal Psych: Pleasant   ECG:  An ECG dated 09/28/2020 was personally reviewed today and demonstrated:  Normal sinus rhythm rate of 67, nonspecific T wave abnormality.  Recent Labwork: No results found for requested labs within last 8760 hours.  No results found for: CHOL, TRIG, HDL, CHOLHDL, VLDL, LDLCALC, LDLDIRECT  Other Studies Reviewed Today:  Echocardiogram 05/01/2013: LV size normal. Systolic function was normal. Ejection fraction was estimated to be 55%. No regional wall motion abnormalities. Wall thickness was increased. Changes consistent with concentric remodeling (increased wall thickness  with normal wall mass). Left ventricular diastolic function parameters were normal for the patient's age. No aortic valve regurgitation. No mitral valve regurgitation. No tricuspid valve regurgitation.   Assessment and Plan: Coronary artery disease Essential hypertension DM2 Dyslipidemia  1. CAD in native artery No recent anginal symptoms.  Continue aspirin 81 mg daily.  Nitroglycerin sublingual as needed.  Continue carvedilol 6.25 mg p.o. twice daily.  2. Essential hypertension Blood pressure well-controlled.  Continue carvedilol 6.25 mg p.o. twice daily.  Continue lisinopril 10 mg p.o. daily.  3. Type 2 diabetes mellitus without complication, without long-term current use of insulin ([place]) Follows with PCP for diabetes - says recent A1c is around 7.0% - will obtain labs  4. Hyperlipidemia LDL goal <70 Continue atorvastatin 40 mg p.o. daily -recently total cholesterol was not at goal.  Will obtain labs from his PCP, may need to add ezetimibe or possibly PCSK9 inhibitor.  Medication Adjustments/Labs and Tests Ordered: Current medicines are reviewed at length with the patient today.  Concerns regarding medicines are outlined above.   Disposition: Follow-up with me annually or sooner as necessary.  Pixie Casino, MD, [facility], [place] Director of the Advanced Lipid Disorders &  Cardiovascular Risk Reduction Clinic Diplomate of the American Board of Clinical Lipidology Attending Cardiologist  Direct Dial: [phone]   Fax: [phone]  Website:  [email or web address]

## 2021-10-04 NOTE — Patient Instructions (Signed)
Medication Instructions:  Your physician recommends that you continue on your current medications as directed. Please refer to the Current Medication list given to you today.   Labwork: None today  Testing/Procedures: None today  Follow-Up: 1 year  Any Other Special Instructions Will Be Listed Below (If Applicable).  If you need a refill on your cardiac medications before your next appointment, please call your pharmacy.  

## 2021-10-13 ENCOUNTER — Other Ambulatory Visit: Payer: Self-pay | Admitting: *Deleted

## 2021-10-13 ENCOUNTER — Telehealth: Payer: Self-pay | Admitting: Internal Medicine

## 2021-10-13 DIAGNOSIS — E785 Hyperlipidemia, unspecified: Secondary | ICD-10-CM

## 2021-10-13 MED ORDER — REPATHA SURECLICK 140 MG/ML ~~LOC~~ SOAJ: 1.0000 | SUBCUTANEOUS | 11 refills | Status: DC

## 2021-10-13 NOTE — Telephone Encounter (Signed)
PA for repatha submitted via [place] [employer] ([place]: [id number])

## 2021-10-14 NOTE — Addendum Note (Signed)
Addended by: Fidel Levy on: 10/14/2021 10:46 AM   Modules accepted: Orders

## 2021-10-24 ENCOUNTER — Other Ambulatory Visit: Payer: Self-pay | Admitting: Gastroenterology

## 2021-10-24 DIAGNOSIS — K5900 Constipation, unspecified: Secondary | ICD-10-CM

## 2021-10-24 NOTE — Telephone Encounter (Signed)
Medication denied. Will appeal   Form for patient to signed emailed to him at [email or web address] He will fax back to take to [place] office to be faxed to [facility] office

## 2021-10-27 NOTE — Telephone Encounter (Signed)
Faxed appeals packet to [facility]

## 2021-10-28 ENCOUNTER — Other Ambulatory Visit: Payer: Self-pay

## 2021-10-29 NOTE — Telephone Encounter (Signed)
Needs ov

## 2021-10-31 ENCOUNTER — Encounter: Payer: Self-pay | Admitting: Internal Medicine

## 2021-11-09 NOTE — Telephone Encounter (Signed)
Per [employer], Repatha appeal denied. Request this denial letter be faxed to our office  Was on the phone a total of 30 minutes for this update

## 2021-11-17 NOTE — Telephone Encounter (Signed)
Per denial letter, Repatha may be approved in certain situations:  - when currently taking max dose of atorvastatin or rosuvastatin for age, have completed 72 days of treatment, and provider has confirmed adherence OR - when there is a clinically significant allergic reaction to statin treatment  OR - when patient has tried and failed BOTH atorvastatin and rosuvastatin   Clinically significant intolerance or allergic reaction must be documented    Per chart review, patient is on atorvastatin 40mg  daily

## 2021-11-18 NOTE — Telephone Encounter (Signed)
Appeal denied - two requests on file Date of service: 10/14/2021 ID: [id number] Authorization #: [id number]  Phone  [phone]

## 2021-12-08 NOTE — Telephone Encounter (Signed)
Upon review of chart, LDL had been as low as 71 in 2019, 62 in 2020, 75 in 2021 and increased to LDL 129 in 2022.  ? ?Per A. Leonides Sake NP note in 2021, LDL of 75 was on atorvastatin 40mg  daily.  ? ?Have been unable to get medication approved due to need to have tried/failed atorvastatin and/or rosuvastatin on max dose. Patient is on atorvastatin 40mg  daily  ? ?Will send to MD to review, as unsure if PA will be approved as denial states "may be able to approve thus drug in certain situations (when you are currently taking the maximum dose of rosuvastatin or atorvastatin for your age, you have completed 90 days of treatment, and your provider has confirmed adherence" OR allergic reactions OR tried/failed both atorvastatin and rosuvastatin.  ? ? ?

## 2021-12-08 NOTE — Telephone Encounter (Signed)
Spent over 60 minutes total on the phone with various parts of patient's insurance (p: [phone]) only to determine that medication is denied, despite appeals.  ? ?Will initiate new PA in [email or web address] and start process over.  ? ?

## 2021-12-13 NOTE — Telephone Encounter (Signed)
Would resubmit appeal - LDL 129, much above goal <70 for diabetes and CAD - on max tolerated atorva 40 mg daily. Trigs now >500. ? ?Dr Lemmie Evens ?

## 2021-12-14 NOTE — Telephone Encounter (Signed)
New appeal faxed thru [email or web address] ?Fax: [phone] ?[id number] ?

## 2021-12-21 NOTE — Telephone Encounter (Signed)
Called [phone] to check on status of appeal ?Was transferred to [phone] ?Was notified that appeal submitted has been cancelled? since PA/appeal was previously denied ? ?Spoke with rep and completed new PA on the phone - will need to fax labs to # [phone] ? ?Case # [id number] ? ? ?Total time: 48 minutes ?

## 2021-12-23 NOTE — Telephone Encounter (Signed)
Patient notified med is approved, what copay is. Scheduled for visit in June with [facility] MD. Lab order mailed ?

## 2021-12-23 NOTE — Telephone Encounter (Signed)
Received fax from [employer] that Repatha '140mg'$ /ml is approved from 12/21/21 -- 12/21/22 ? ?Called [facility] to check on status - ran thru for $4 copay ?

## 2022-03-29 ENCOUNTER — Encounter: Payer: Self-pay | Admitting: Internal Medicine

## 2022-03-29 ENCOUNTER — Ambulatory Visit (INDEPENDENT_AMBULATORY_CARE_PROVIDER_SITE_OTHER): Payer: Medicare Other | Admitting: Internal Medicine

## 2022-03-29 VITALS — BP 142/80 | HR 74 | Ht 71.0 in | Wt 226.2 lb

## 2022-03-29 DIAGNOSIS — I2510 Atherosclerotic heart disease of native coronary artery without angina pectoris: Secondary | ICD-10-CM | POA: Diagnosis not present

## 2022-03-29 DIAGNOSIS — E119 Type 2 diabetes mellitus without complications: Secondary | ICD-10-CM | POA: Diagnosis not present

## 2022-03-29 DIAGNOSIS — E785 Hyperlipidemia, unspecified: Secondary | ICD-10-CM | POA: Diagnosis not present

## 2022-03-29 DIAGNOSIS — I10 Essential (primary) hypertension: Secondary | ICD-10-CM

## 2022-03-29 NOTE — Progress Notes (Signed)
Cardiology Office Note  Date: 03/29/2022   ID: Darius Rogers, DOB [birth year], MRN [id number]  PCP:  Monico Blitz, MD  Cardiologist:  None Electrophysiologist:  None   Chief Complaint: Cardiac follow up  History of Present Illness: Darius Rogers is a 66 y.o. male with a history of CAD/MI, HTN, HLD, DM2, tobacco abuse, obesity.  Last encounter with Dr. Bronson Ing 09/09/2019 via telemedicine. History of NSTEMI with PCI/DES to LAD 2014. Echocardiogram at that time demonstrated normal LV systolic and diastolic function with LVEF of 55%. Cardiac catheterization 05/05/2013 95% stenosis of LAD with mild luminal irregularities left circumflex and mid RCA stenosis of 20%. Denied any symptoms of chest pain, palpitations, shortness of breath, lightheadedness, dizziness, leg swelling, orthopnea, PND. His CAD was symptomatically stable. He was to continue aspirin, atorvastatin, lisinopril, carvedilol. Blood pressure was controlled at PCP office. Currently on Lipitor 40 mg daily. He was taking Metformin 1000 mg p.o. twice daily for type 2 diabetes. He had recently put on some weight.  Patient is here for 1 year follow-up.  He denies any recent acute illnesses or hospitalizations in the interim since last visit.  States he is recently had some left shoulder/arm pain.  States he notices the pain at rest and when he is attempting to lie on his left side when sleeping.  He has been taking nonsteroidal anti-inflammatory medications i.e. Tylenol for pain relief.  He denies any associated radiation to neck, arm, back, jaw.  Denies any associated nausea, vomiting, diaphoresis.  No recent anginal symptoms per his statement.  He states he is not very active on a daily basis.  States he recently saw his primary care provider who performed lab work and states that all the lab work was normal.  Blood pressure is elevated on arrival today.  Patient states normally his blood pressure is within normal limits at home.  He  denies any CVA or TIA-like symptoms, orthostatic symptoms, palpitations or arrhythmias, PND, orthopnea, bleeding, claudication, DVT or PE-like symptoms, lower extremity edema.  EKG today shows normal sinus rhythm rate of 67, nonspecific T wave abnormality.  10/04/2021  Darius Rogers is seen today for annual follow-up.  He was last seen by Levell July, NP and now is establishing care with me.  He has a history of remote NSTEMI with PCI to the LAD in 2014 in [place].  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  He has since been living here and doing well.  Denies any worsening symptoms or recurrent chest pain.  Blood pressures been well controlled.  He is lost a little weight.  His labs are followed closely by his PCP which we will need to obtain.  He was told recently that his cholesterol was little higher than desired.  03/29/2022  Darius Rogers is seen today in follow-up.  He was successfully started on Repatha in addition to his statin.  He says he has been taking it now for several months but unfortunately did not get blood work although we did send him lab orders for that back in March.  I cannot therefore advised him on how his cholesterol is doing but will need to get the labs drawn.  He says he has been fasting for more than 6 hours today and could get the labs drawn.  He denies any chest pain or shortness of breath.   Past Medical History:  Diagnosis Date   Arthritis    "knees" (11/13/2017)   CAD (coronary artery disease)    Constipation    Heart attack ([place]) 2014   DES in LAD   Hyperlipemia    Hypertension    Type II diabetes mellitus ([place])     Past Surgical History:  Procedure Laterality Date   COLONOSCOPY     COLONOSCOPY N/A 12/29/2019   Procedure: COLONOSCOPY;  Surgeon: Danie Binder, MD;  [facility];  Service: Endoscopy;  Laterality: N/A;  9:30am   CORONARY ANGIOPLASTY WITH STENT  PLACEMENT  2014   CYSTOSCOPY WITH INSERTION OF UROLIFT N/A 03/11/2018   Procedure: CYSTOSCOPY WITH INSERTION OF UROLIFT;  Surgeon: Cleon Gustin, MD;  [facility]: [facility];  Service: Urology;  Laterality: N/A;   FOREARM FRACTURE SURGERY Right 1975   FRACTURE SURGERY Right 1975   R arm    JOINT REPLACEMENT     PAROTIDECTOMY Right 02/19/2018   Procedure: RIGHT PAROTIDECTOMY;  Surgeon: [name], MD;  Location: [facility];  Service: ENT;  Laterality: Right;  RNFA   POLYPECTOMY  12/29/2019   Procedure: POLYPECTOMY;  Surgeon: Danie Binder, MD;  [facility];  Service: Endoscopy;;   TOTAL KNEE ARTHROPLASTY Right 05/08/2017   Procedure: RIGHT TOTAL KNEE ARTHROPLASTY;  Surgeon: Garald Balding, MD;  Location: [facility];  Service: Orthopedics;  Laterality: Right;   TOTAL KNEE ARTHROPLASTY Left 11/13/2017   TOTAL KNEE ARTHROPLASTY Left 11/13/2017   Procedure: LEFT TOTAL KNEE ARTHROPLASTY;  Surgeon: Garald Balding, MD;  Location: [facility];  Service: Orthopedics;  Laterality: Left;    Current Outpatient Medications  Medication Sig Dispense Refill   aspirin EC 81 MG tablet Take 81 mg by mouth daily.     atorvastatin (LIPITOR) 40 MG tablet TAKE 1 TABLET BY MOUTH ONCE DAILY AT NIGHT (Patient taking differently: Take 40 mg by mouth at bedtime.) 90 tablet 3   bisacodyl (BISACODYL) 5 MG EC tablet Take 10 mg by mouth at bedtime.     carvedilol (COREG) 6.25 MG tablet Take 6.25 mg by mouth 2 (two) times daily.     dapagliflozin propanediol (FARXIGA) 5 MG TABS tablet Take 5 mg by mouth daily.     Evolocumab (REPATHA SURECLICK) 993 MG/ML SOAJ Inject 1 Dose into the skin every 14 (fourteen) days. 2 mL 11   linaclotide (LINZESS) 145 MCG CAPS capsule TAKE 1 CAPSULE BY MOUTH ONCE DAILY BEFORE BREAKFAST 90 capsule 1   lisinopril (ZESTRIL) 10 MG tablet Take 10 mg by mouth at bedtime.     loratadine (CLARITIN) 10 MG tablet Take 10 mg by mouth at bedtime.     metFORMIN (GLUCOPHAGE) 1000 MG  tablet Take 1,000 mg by mouth in the morning and at bedtime.     nitroGLYCERIN (NITROSTAT) 0.4 MG SL tablet Place 0.4 mg under the tongue every 5 (five) minutes x 3 doses as needed for chest pain (if no relief after 2nd dose, proceed to the ED for an evaluation or call 911).     No current facility-administered medications for this visit.   Allergies:  Bactrim [sulfamethoxazole-trimethoprim]   Social History: The patient  reports that he quit smoking about 8 years ago. His smoking use included cigarettes. He started smoking about 17 years ago. He has a 5.50 pack-year smoking history. He has never used smokeless tobacco. He reports that he does not drink alcohol and does not use drugs.   Family History: The patient's family history includes  Diabetes in his mother; Heart attack in his maternal grandfather; Hypertension in his mother; Other in his maternal grandmother; Post-traumatic stress disorder in his father; Sleep apnea in his father; Stomach cancer in his paternal grandmother.   ROS:  Please see the history of present illness. Otherwise, complete review of systems is positive for none.  All other systems are reviewed and negative.   Physical Exam: VS:  BP (!) 142/80   Pulse 74   Ht '5\' 11"'$  (1.803 m)   Wt 226 lb 3.2 oz (102.6 kg)   SpO2 95%   BMI 31.55 kg/m , BMI Body mass index is 31.55 kg/m.  Wt Readings from Last 3 Encounters:  03/29/22 226 lb 3.2 oz (102.6 kg)  10/04/21 234 lb (106.1 kg)  09/28/20 237 lb 9.6 oz (107.8 kg)    General appearance: alert and no distress Neck: no carotid bruit, no JVD, and thyroid not enlarged, symmetric, no tenderness/mass/nodules Lungs: clear to auscultation bilaterally Heart: regular rate and rhythm, S1, S2 normal, no murmur, click, rub or gallop Abdomen: soft, non-tender; bowel sounds normal; no masses,  no organomegaly Extremities: extremities normal, atraumatic, no cyanosis or edema Pulses: 2+ and symmetric Skin: Skin color, texture, turgor  normal. No rashes or lesions Neurologic: Grossly normal Psych: Pleasant   ECG:  An ECG dated 09/28/2020 was personally reviewed today and demonstrated:  Normal sinus rhythm rate of 67, nonspecific T wave abnormality.  Recent Labwork: No results found for requested labs within last 365 days.  No results found for: "CHOL", "TRIG", "HDL", "CHOLHDL", "VLDL", "LDLCALC", "LDLDIRECT"  Other Studies Reviewed Today:  Echocardiogram 05/01/2013: LV size normal. Systolic function was normal. Ejection fraction was estimated to be 55%. No regional wall motion abnormalities. Wall thickness was increased. Changes consistent with concentric remodeling (increased wall thickness with normal wall mass). Left ventricular diastolic function parameters were normal for the patient's age. No aortic valve regurgitation. No mitral valve regurgitation. No tricuspid valve regurgitation.   Assessment and Plan: Coronary artery disease Essential hypertension DM2 Dyslipidemia  1. CAD in native artery No recent anginal symptoms.  Continue aspirin 81 mg daily.  Nitroglycerin sublingual as needed.  Continue carvedilol 6.25 mg p.o. twice daily.  2. Essential hypertension Blood pressure well-controlled.  Continue carvedilol 6.25 mg p.o. twice daily.  Continue lisinopril 10 mg p.o. daily.  3. Type 2 diabetes mellitus without complication, without long-term current use of insulin ([place]) Follows with PCP for diabetes - says recent A1c is around 7.0% - will obtain labs  4. Hyperlipidemia LDL goal <70 Continue atorvastatin 40 mg p.o. daily -recently total cholesterol was not at goal.  Successfully started on Repatha about 3 months ago but has not had repeat labs.  We will get those today.  Medication Adjustments/Labs and Tests Ordered: Current medicines are reviewed at length with the patient today.  Concerns regarding medicines are outlined above.   Disposition: Follow-up with me annually or sooner as necessary.  Pixie Casino, MD, [facility], [place] Director of the Advanced Lipid Disorders &  Cardiovascular Risk Reduction Clinic Diplomate of the American Board of Clinical Lipidology Attending Cardiologist  Direct Dial: [phone]  Fax: [phone]  Website:  [email or web address]

## 2022-03-29 NOTE — Patient Instructions (Signed)
Medication Instructions:  Your physician recommends that you continue on your current medications as directed. Please refer to the Current Medication list given to you today.   Labwork: Lipid Direct LDL  Testing/Procedures: None  Follow-Up: Follow up with Dr. Debara Pickett in 1 year.   Any Other Special Instructions Will Be Listed Below (If Applicable).     If you need a refill on your cardiac medications before your next appointment, please call your pharmacy.

## 2022-03-30 LAB — LIPID PANEL
Chol/HDL Ratio: 3.4 ratio (ref 0.0–5.0)
Cholesterol, Total: 108 mg/dL (ref 100–199)
HDL: 32 mg/dL — ABNORMAL LOW (ref >39)
LDL Chol Calc (NIH): 32 mg/dL (ref 0–99)
Triglycerides: 294 mg/dL — ABNORMAL HIGH (ref 0–149)
VLDL Cholesterol Cal: 44 mg/dL — ABNORMAL HIGH (ref 5–40)

## 2022-03-30 LAB — LDL CHOLESTEROL, DIRECT: LDL Direct: 35 mg/dL (ref 0–99)

## 2022-04-04 ENCOUNTER — Telehealth: Payer: Self-pay | Admitting: Internal Medicine

## 2022-04-04 ENCOUNTER — Encounter: Payer: Self-pay | Admitting: Internal Medicine

## 2022-04-04 NOTE — Telephone Encounter (Signed)
Patient has new insurance - [employer] dual complete New repatha PA submitted via CMM (Key: [id number])

## 2022-04-05 NOTE — Telephone Encounter (Signed)
PA denied for not having LDL > 70 w/ASCVD or LDL >100 without ASCVD  Appeals letter composed - faxed with last lab work, MD note, copy of denial letter to [phone]

## 2022-04-13 NOTE — Telephone Encounter (Signed)
Medication approved until Oct 12, 2022.  Auth# [id number]

## 2022-04-15 ENCOUNTER — Other Ambulatory Visit: Payer: Self-pay | Admitting: Gastroenterology

## 2022-04-15 DIAGNOSIS — K5900 Constipation, unspecified: Secondary | ICD-10-CM

## 2022-04-17 ENCOUNTER — Encounter: Payer: Self-pay | Admitting: Internal Medicine

## 2022-04-17 NOTE — Telephone Encounter (Signed)
Unable to refill Linzess as we have not see patient in over 2 years. Please set him up for a VV on Friday with me if he has the capability. Otherwise, will need in person visit.

## 2022-05-07 ENCOUNTER — Other Ambulatory Visit: Payer: Self-pay | Admitting: Gastroenterology

## 2022-05-07 DIAGNOSIS — K5900 Constipation, unspecified: Secondary | ICD-10-CM

## 2022-05-08 NOTE — Telephone Encounter (Signed)
Unable to fill Linzess as we have not seen him in over 2 years.  Keep upcoming appointment in September with Magda Paganini.  We can see if we can get him in sooner if needed.  Again, virtual visit is okay with me on a Friday if he has the capability.

## 2022-05-16 DIAGNOSIS — H2513 Age-related nuclear cataract, bilateral: Secondary | ICD-10-CM | POA: Diagnosis not present

## 2022-05-16 DIAGNOSIS — Z7984 Long term (current) use of oral hypoglycemic drugs: Secondary | ICD-10-CM | POA: Diagnosis not present

## 2022-05-16 DIAGNOSIS — E119 Type 2 diabetes mellitus without complications: Secondary | ICD-10-CM | POA: Diagnosis not present

## 2022-05-22 ENCOUNTER — Other Ambulatory Visit: Payer: Self-pay | Admitting: Gastroenterology

## 2022-05-22 DIAGNOSIS — K5900 Constipation, unspecified: Secondary | ICD-10-CM

## 2022-05-24 ENCOUNTER — Encounter: Payer: Self-pay | Admitting: Gastroenterology

## 2022-05-24 ENCOUNTER — Telehealth: Payer: Self-pay | Admitting: *Deleted

## 2022-05-24 ENCOUNTER — Ambulatory Visit (INDEPENDENT_AMBULATORY_CARE_PROVIDER_SITE_OTHER): Payer: Medicare Other | Admitting: Gastroenterology

## 2022-05-24 DIAGNOSIS — K5900 Constipation, unspecified: Secondary | ICD-10-CM | POA: Diagnosis not present

## 2022-05-24 DIAGNOSIS — R160 Hepatomegaly, not elsewhere classified: Secondary | ICD-10-CM | POA: Diagnosis not present

## 2022-05-24 MED ORDER — LINACLOTIDE 145 MCG PO CAPS: ORAL_CAPSULE | ORAL | 3 refills | Status: DC

## 2022-05-24 NOTE — Progress Notes (Signed)
Gastroenterology Office Note     Primary Care Physician:  Monico Blitz, MD  Primary Gastroenterologist: Dr. Abbey Chatters, previously Dr. Oneida Alar    Chief Complaint   Chief Complaint  Patient presents with   Constipation    Follow up on constipation. Needs refill on linzess. Has been out of med for about one week.      History of Present Illness   Darius Rogers is a 66 y.o. male presenting today in follow-up with a history of chronic constipation and fatty liver. He is here for refills. Last colonoscopy in 2021 with adenomas and due for surveillance in 2026.   No abdominal pain, N/V, changes in bowel habits,  diarrhea, overt GI bleeding, GERD, dysphagia, unexplained weight loss, lack of appetite, unexplained weight gain.   He ran out of Linzess and has had constipation the past week as he was out.  2020: [place] with fatty liver and liver cysts.   Outside labs OCt 2022: Tbili 0.5, Al Phos 92, AST 45, ALT 49.   2021 labs: no iron overload, Hep B negative. Hep C negative. No ETOH   Past Medical History:  Diagnosis Date   Arthritis    "knees" (11/13/2017)   CAD (coronary artery disease)    Constipation    Heart attack ([place]) 2014   DES in LAD   Hyperlipemia    Hypertension    Type II diabetes mellitus ([place])     Past Surgical History:  Procedure Laterality Date   COLONOSCOPY     COLONOSCOPY N/A 12/29/2019   2 simple adenomas and one benign polypoid tissue in 2021. Surveillance due in 2026   CORONARY ANGIOPLASTY WITH STENT PLACEMENT  2014   CYSTOSCOPY WITH INSERTION OF UROLIFT N/A 03/11/2018   Procedure: CYSTOSCOPY WITH INSERTION OF UROLIFT;  Surgeon: Cleon Gustin, MD;  [facility];  Service: Urology;  Laterality: N/A;   FOREARM FRACTURE SURGERY Right 1975   FRACTURE SURGERY Right 1975   R arm    JOINT REPLACEMENT     PAROTIDECTOMY Right 02/19/2018   Procedure: RIGHT PAROTIDECTOMY;  Surgeon: [name], MD;  Location: [facility];  Service:  ENT;  Laterality: Right;  RNFA   POLYPECTOMY  12/29/2019   Procedure: POLYPECTOMY;  Surgeon: Danie Binder, MD;  [facility];  Service: Endoscopy;;   TOTAL KNEE ARTHROPLASTY Right 05/08/2017   Procedure: RIGHT TOTAL KNEE ARTHROPLASTY;  Surgeon: Garald Balding, MD;  Location: [facility];  Service: Orthopedics;  Laterality: Right;   TOTAL KNEE ARTHROPLASTY Left 11/13/2017   TOTAL KNEE ARTHROPLASTY Left 11/13/2017   Procedure: LEFT TOTAL KNEE ARTHROPLASTY;  Surgeon: Garald Balding, MD;  Location: [facility];  Service: Orthopedics;  Laterality: Left;    Current Outpatient Medications  Medication Sig Dispense Refill   aspirin EC 81 MG tablet Take 81 mg by mouth daily.     atorvastatin (LIPITOR) 40 MG tablet TAKE 1 TABLET BY MOUTH ONCE DAILY AT NIGHT (Patient taking differently: Take 40 mg by mouth at bedtime.) 90 tablet 3   bisacodyl (BISACODYL) 5 MG EC tablet Take 10 mg by mouth at bedtime.     carvedilol (COREG) 6.25 MG tablet Take 6.25 mg by mouth 2 (two) times daily.     dapagliflozin propanediol (FARXIGA) 5 MG TABS tablet Take 5 mg by mouth daily.     Evolocumab (REPATHA SURECLICK) 470 MG/ML SOAJ Inject 1 Dose into the skin every 14 (fourteen) days. 2 mL 11  linaclotide (LINZESS) 145 MCG CAPS capsule TAKE 1 CAPSULE BY MOUTH ONCE DAILY BEFORE BREAKFAST 90 capsule 1   lisinopril (ZESTRIL) 10 MG tablet Take 10 mg by mouth at bedtime.     loratadine (CLARITIN) 10 MG tablet Take 10 mg by mouth at bedtime.     metFORMIN (GLUCOPHAGE) 1000 MG tablet Take 1,000 mg by mouth in the morning and at bedtime.     nitroGLYCERIN (NITROSTAT) 0.4 MG SL tablet Place 0.4 mg under the tongue every 5 (five) minutes x 3 doses as needed for chest pain (if no relief after 2nd dose, proceed to the ED for an evaluation or call 911).     No current facility-administered medications for this visit.    Allergies as of 05/24/2022 - Review Complete 05/24/2022  Allergen Reaction Noted   Bactrim  [sulfamethoxazole-trimethoprim] Hives 02/12/2018    Family History  Problem Relation Age of Onset   Hypertension Mother    Diabetes Mother    Post-traumatic stress disorder Father    Sleep apnea Father    Other Maternal Grandmother        PPM   Heart attack Maternal Grandfather    Stomach cancer Paternal Grandmother    Colon cancer Neg Hx    Colon polyps Neg Hx     Social History   Socioeconomic History   Marital status: Divorced    Spouse name: Not on file   Number of children: Not on file   Years of education: Not on file   Highest education level: Not on file  Occupational History   Not on file  Tobacco Use   Smoking status: Former    Packs/day: 0.50    Years: 11.00    Total pack years: 5.50    Types: Cigarettes    Start date: 10/09/2004    Quit date: 05/09/2013    Years since quitting: 9.0    Passive exposure: Past   Smokeless tobacco: Never  Vaping Use   Vaping Use: Never used  Substance and Sexual Activity   Alcohol use: No    Alcohol/week: 0.0 standard drinks of alcohol   Drug use: No   Sexual activity: Not Currently  Other Topics Concern   Not on file  Social History Narrative   Not on file   Social Determinants of Health   Financial Resource Strain: Not on file  Food Insecurity: Not on file  Transportation Needs: Not on file  Physical Activity: Not on file  Stress: Not on file  Social Connections: Not on file  Intimate Partner Violence: Not on file     Review of Systems   Gen: Denies any fever, chills, fatigue, weight loss, lack of appetite.  CV: Denies chest pain, heart palpitations, peripheral edema, syncope.  Resp: Denies shortness of breath at rest or with exertion. Denies wheezing or cough.  GI: see HPI GU : Denies urinary burning, urinary frequency, urinary hesitancy MS: Denies joint pain, muscle weakness, cramps, or limitation of movement.  Derm: Denies rash, itching, dry skin Psych: Denies depression, anxiety, memory loss, and  confusion Heme: Denies bruising, bleeding, and enlarged lymph nodes.   Physical Exam   BP 122/82 (BP Location: Left Arm, Patient Position: Sitting, Cuff Size: Large)   Pulse 66   Temp 98.2 F (36.8 C) (Oral)   Ht '5\' 11"'$  (1.803 m)   Wt 227 lb (103 kg)   BMI 31.66 kg/m  General:   Alert and oriented. Pleasant and cooperative. Well-nourished and well-developed.  Head:  Normocephalic  and atraumatic. Eyes:  Without icterus Abdomen:  +BS, soft, non-tender and non-distended. Liver margin palpable extending across midline and below rib margin Rectal:  Deferred  Msk:  Symmetrical without gross deformities. Normal posture. Extremities:  Without edema. Neurologic:  Alert and  oriented x4;  grossly normal neurologically. Skin:  Intact without significant lesions or rashes. Psych:  Alert and cooperative. Normal mood and affect.   Assessment   Darius Rogers is a 66 y.o. male presenting today in follow-up with a history of  chronic constipation and fatty liver, here for refills today.  Constipation: has done well on Linzess 145 mcg daily. We have refilled this.  Fatty liver: hepatomegaly noted on physical exam today. I will update US abdomen, as last was in 2020 with fatty liver and liver cysts. I am also requesting outside labs. Prior limited evaluation without iron overload, Hep B and C negative. Denies ETOH use.     PLAN   Linzess 145 mcg refilled US abdomen complete Obtain outside LFTs Colonoscopy 2026 Further recommendations to follow   Annitta Needs, PhD, ANP-BC [facility]

## 2022-05-24 NOTE — Telephone Encounter (Signed)
Patient informed of [place] scheduled for 06/02/22 @ 8:30 am be at [facility] at 8:00 am. Nothing to eat or drink after midnight. Pt verbalized understanding.

## 2022-05-24 NOTE — Patient Instructions (Signed)
I have refilled Linzess for you.  I have ordered an ultrasound to assess your liver again.  Further recommendations to follow!  It was a pleasure to see you today. I want to create trusting relationships with patients to provide genuine, compassionate, and quality care. I value your feedback. If you receive a survey regarding your visit,  I greatly appreciate you taking time to fill this out.   Annitta Needs, PhD, ANP-BC [facility]

## 2022-05-31 DIAGNOSIS — E1165 Type 2 diabetes mellitus with hyperglycemia: Secondary | ICD-10-CM | POA: Diagnosis not present

## 2022-05-31 DIAGNOSIS — I10 Essential (primary) hypertension: Secondary | ICD-10-CM | POA: Diagnosis not present

## 2022-05-31 DIAGNOSIS — E7800 Pure hypercholesterolemia, unspecified: Secondary | ICD-10-CM | POA: Diagnosis not present

## 2022-05-31 DIAGNOSIS — Z299 Encounter for prophylactic measures, unspecified: Secondary | ICD-10-CM | POA: Diagnosis not present

## 2022-06-02 ENCOUNTER — Ambulatory Visit (HOSPITAL_COMMUNITY)
Admission: RE | Admit: 2022-06-02 | Discharge: 2022-06-02 | Disposition: A | Discharge status: Home / Self Care | Payer: Medicare Other | Source: Ambulatory Visit | Attending: Gastroenterology | Admitting: Gastroenterology

## 2022-06-02 DIAGNOSIS — K7689 Other specified diseases of liver: Secondary | ICD-10-CM | POA: Diagnosis not present

## 2022-06-02 DIAGNOSIS — R160 Hepatomegaly, not elsewhere classified: Secondary | ICD-10-CM | POA: Insufficient documentation

## 2022-06-08 DIAGNOSIS — Z1212 Encounter for screening for malignant neoplasm of rectum: Secondary | ICD-10-CM | POA: Diagnosis not present

## 2022-06-08 DIAGNOSIS — Z1211 Encounter for screening for malignant neoplasm of colon: Secondary | ICD-10-CM | POA: Diagnosis not present

## 2022-06-20 DIAGNOSIS — D2261 Melanocytic nevi of right upper limb, including shoulder: Secondary | ICD-10-CM | POA: Diagnosis not present

## 2022-06-20 DIAGNOSIS — D2262 Melanocytic nevi of left upper limb, including shoulder: Secondary | ICD-10-CM | POA: Diagnosis not present

## 2022-06-20 DIAGNOSIS — X32XXXA Exposure to sunlight, initial encounter: Secondary | ICD-10-CM | POA: Diagnosis not present

## 2022-06-20 DIAGNOSIS — L570 Actinic keratosis: Secondary | ICD-10-CM | POA: Diagnosis not present

## 2022-06-20 DIAGNOSIS — D2271 Melanocytic nevi of right lower limb, including hip: Secondary | ICD-10-CM | POA: Diagnosis not present

## 2022-06-20 DIAGNOSIS — Z09 Encounter for follow-up examination after completed treatment for conditions other than malignant neoplasm: Secondary | ICD-10-CM | POA: Diagnosis not present

## 2022-06-20 DIAGNOSIS — D2272 Melanocytic nevi of left lower limb, including hip: Secondary | ICD-10-CM | POA: Diagnosis not present

## 2022-06-20 DIAGNOSIS — D225 Melanocytic nevi of trunk: Secondary | ICD-10-CM | POA: Diagnosis not present

## 2022-06-20 DIAGNOSIS — Z872 Personal history of diseases of the skin and subcutaneous tissue: Secondary | ICD-10-CM | POA: Diagnosis not present

## 2022-06-20 DIAGNOSIS — L821 Other seborrheic keratosis: Secondary | ICD-10-CM | POA: Diagnosis not present

## 2022-06-27 ENCOUNTER — Ambulatory Visit: Payer: Medicare Other | Admitting: Gastroenterology

## 2022-06-27 NOTE — Telephone Encounter (Signed)
Darius Rogers is still okay until Jan 01/2023 by Dorann Lodge at 06/23/2022 1655.

## 2022-07-18 DIAGNOSIS — Z0000 Encounter for general adult medical examination without abnormal findings: Secondary | ICD-10-CM | POA: Diagnosis not present

## 2022-07-18 DIAGNOSIS — R5383 Other fatigue: Secondary | ICD-10-CM | POA: Diagnosis not present

## 2022-07-18 DIAGNOSIS — Z87891 Personal history of nicotine dependence: Secondary | ICD-10-CM | POA: Diagnosis not present

## 2022-07-18 DIAGNOSIS — E7800 Pure hypercholesterolemia, unspecified: Secondary | ICD-10-CM | POA: Diagnosis not present

## 2022-07-18 DIAGNOSIS — Z7189 Other specified counseling: Secondary | ICD-10-CM | POA: Diagnosis not present

## 2022-07-18 DIAGNOSIS — Z299 Encounter for prophylactic measures, unspecified: Secondary | ICD-10-CM | POA: Diagnosis not present

## 2022-07-25 DIAGNOSIS — I25119 Atherosclerotic heart disease of native coronary artery with unspecified angina pectoris: Secondary | ICD-10-CM | POA: Diagnosis not present

## 2022-07-25 DIAGNOSIS — R5383 Other fatigue: Secondary | ICD-10-CM | POA: Diagnosis not present

## 2022-07-25 DIAGNOSIS — E7800 Pure hypercholesterolemia, unspecified: Secondary | ICD-10-CM | POA: Diagnosis not present

## 2022-09-06 DIAGNOSIS — Z713 Dietary counseling and surveillance: Secondary | ICD-10-CM | POA: Diagnosis not present

## 2022-09-06 DIAGNOSIS — Z23 Encounter for immunization: Secondary | ICD-10-CM | POA: Diagnosis not present

## 2022-09-06 DIAGNOSIS — Z299 Encounter for prophylactic measures, unspecified: Secondary | ICD-10-CM | POA: Diagnosis not present

## 2022-09-06 DIAGNOSIS — I10 Essential (primary) hypertension: Secondary | ICD-10-CM | POA: Diagnosis not present

## 2022-09-06 DIAGNOSIS — E1165 Type 2 diabetes mellitus with hyperglycemia: Secondary | ICD-10-CM | POA: Diagnosis not present

## 2022-10-11 DIAGNOSIS — M9901 Segmental and somatic dysfunction of cervical region: Secondary | ICD-10-CM | POA: Diagnosis not present

## 2022-10-11 DIAGNOSIS — S338XXA Sprain of other parts of lumbar spine and pelvis, initial encounter: Secondary | ICD-10-CM | POA: Diagnosis not present

## 2022-10-11 DIAGNOSIS — M9903 Segmental and somatic dysfunction of lumbar region: Secondary | ICD-10-CM | POA: Diagnosis not present

## 2022-10-11 DIAGNOSIS — S233XXA Sprain of ligaments of thoracic spine, initial encounter: Secondary | ICD-10-CM | POA: Diagnosis not present

## 2022-10-11 DIAGNOSIS — S134XXA Sprain of ligaments of cervical spine, initial encounter: Secondary | ICD-10-CM | POA: Diagnosis not present

## 2022-10-11 DIAGNOSIS — M9902 Segmental and somatic dysfunction of thoracic region: Secondary | ICD-10-CM | POA: Diagnosis not present

## 2022-10-20 ENCOUNTER — Other Ambulatory Visit: Payer: Self-pay | Admitting: Internal Medicine

## 2022-12-13 DIAGNOSIS — I25119 Atherosclerotic heart disease of native coronary artery with unspecified angina pectoris: Secondary | ICD-10-CM | POA: Diagnosis not present

## 2022-12-13 DIAGNOSIS — I10 Essential (primary) hypertension: Secondary | ICD-10-CM | POA: Diagnosis not present

## 2022-12-13 DIAGNOSIS — E1159 Type 2 diabetes mellitus with other circulatory complications: Secondary | ICD-10-CM | POA: Diagnosis not present

## 2022-12-13 DIAGNOSIS — E1165 Type 2 diabetes mellitus with hyperglycemia: Secondary | ICD-10-CM | POA: Diagnosis not present

## 2022-12-13 DIAGNOSIS — Z299 Encounter for prophylactic measures, unspecified: Secondary | ICD-10-CM | POA: Diagnosis not present

## 2022-12-13 DIAGNOSIS — E119 Type 2 diabetes mellitus without complications: Secondary | ICD-10-CM | POA: Diagnosis not present

## 2022-12-16 ENCOUNTER — Other Ambulatory Visit: Payer: Self-pay | Admitting: Internal Medicine

## 2022-12-16 DIAGNOSIS — I2510 Atherosclerotic heart disease of native coronary artery without angina pectoris: Secondary | ICD-10-CM

## 2022-12-16 DIAGNOSIS — E785 Hyperlipidemia, unspecified: Secondary | ICD-10-CM

## 2023-02-25 ENCOUNTER — Other Ambulatory Visit: Payer: Self-pay | Admitting: Internal Medicine

## 2023-02-25 DIAGNOSIS — I2510 Atherosclerotic heart disease of native coronary artery without angina pectoris: Secondary | ICD-10-CM

## 2023-02-25 DIAGNOSIS — E785 Hyperlipidemia, unspecified: Secondary | ICD-10-CM

## 2023-03-19 NOTE — Progress Notes (Signed)
Cardiology Office Note:    Date:  03/26/2023   ID:  Darius Rogers, DOB [birth year], MRN [id number]  PCP:  Kirstie Peri, MD  [facility] Providers Cardiologist:  Chrystie Nose, MD     Referring MD: Kirstie Peri, MD   Chief Complaint:  Follow-up     History of Present Illness:   Darius Rogers is a 67 y.o. male with  a history of CAD/MI, HTN, HLD, DM2, tobacco abuse, obesity.      NSTEMI with PCI/DES to LAD 04/2013 with mild luminal irregularities left circumflex and mid RCA stenosis of 20%.. Echocardiogram at that time demonstrated normal LV systolic and diastolic function with LVEF of 55%.  Last saw Dr. Rennis Golden 03/2022 and doing well. On repatha and statin.    Patient comes in for yearly f/u. Says his wife just took a job with cardiology in [place]. He met her on facebook and brought her here from the [place] He's not getting any regular exercise. Doesn't smoke. He trades Sales executive.     Past Medical History:  Diagnosis Date   Arthritis    "knees" (11/13/2017)   CAD (coronary artery disease)    Constipation    Heart attack (HCC) 2014   DES in LAD   Hyperlipemia    Hypertension    Type II diabetes mellitus (HCC)    Current Medications: Current Meds  Medication Sig   aspirin EC 81 MG tablet Take 81 mg by mouth daily.   atorvastatin (LIPITOR) 40 MG tablet TAKE 1 TABLET BY MOUTH ONCE DAILY AT NIGHT (Patient taking differently: Take 40 mg by mouth at bedtime.)   bisacodyl (BISACODYL) 5 MG EC tablet Take 10 mg by mouth at bedtime.   carvedilol (COREG) 6.25 MG tablet Take 6.25 mg by mouth 2 (two) times daily.   dapagliflozin propanediol (FARXIGA) 5 MG TABS tablet Take 5 mg by mouth daily.   Evolocumab (REPATHA SURECLICK) 140 MG/ML SOAJ INJECT 1 DOSE SUBCUTANEOUSLY EVERY 14 DAYS   linaclotide (LINZESS) 145 MCG CAPS capsule TAKE 1 CAPSULE BY MOUTH ONCE DAILY BEFORE BREAKFAST   lisinopril (ZESTRIL) 10 MG tablet Take 10 mg by mouth at bedtime.   loratadine (CLARITIN)  10 MG tablet Take 10 mg by mouth at bedtime.   metFORMIN (GLUCOPHAGE) 1000 MG tablet Take 1,000 mg by mouth in the morning and at bedtime.   nitroGLYCERIN (NITROSTAT) 0.4 MG SL tablet Place 0.4 mg under the tongue every 5 (five) minutes x 3 doses as needed for chest pain (if no relief after 2nd dose, proceed to the ED for an evaluation or call 911).    Allergies:   Bactrim [sulfamethoxazole-trimethoprim]   Social History   Tobacco Use   Smoking status: Former    Packs/day: 0.50    Years: 11.00    Additional pack years: 0.00    Total pack years: 5.50    Types: Cigarettes    Start date: 10/09/2004    Quit date: 05/09/2013    Years since quitting: 9.8    Passive exposure: Past   Smokeless tobacco: Never  Vaping Use   Vaping Use: Never used  Substance Use Topics   Alcohol use: No    Alcohol/week: 0.0 standard drinks of alcohol   Drug use: No    Family Hx: The patient's family history includes Diabetes in his mother; Heart attack in his maternal grandfather; Hypertension in his mother; Other in his maternal grandmother; Post-traumatic stress disorder in his father; Sleep apnea in his father; Stomach  cancer in his paternal grandmother. There is no history of Colon cancer or Colon polyps.  ROS     Physical Exam:    VS:  BP 118/60   Pulse 74   Ht 6' (1.829 m)   Wt 224 lb 3.2 oz (101.7 kg)   SpO2 94%   BMI 30.41 kg/m     Wt Readings from Last 3 Encounters:  03/26/23 224 lb 3.2 oz (101.7 kg)  05/24/22 227 lb (103 kg)  03/29/22 226 lb 3.2 oz (102.6 kg)    Physical Exam  GEN: Obese, in no acute distress  Neck: no JVD, carotid bruits, or masses Cardiac:RRR; no murmurs, rubs, or gallops  Respiratory:  clear to auscultation bilaterally, normal work of breathing GI: soft, nontender, nondistended, + BS Ext: without cyanosis, clubbing, or edema, Good distal pulses bilaterally Neuro:  Alert and Oriented x 3,  Psych: euthymic mood, full affect        EKGs/Labs/Other Test  Reviewed:    EKG:  EKG is   ordered today.  The ekg ordered today demonstrates NSR with nonspecific ST changes  Recent Labs: No results found for requested labs within last 365 days.   Recent Lipid Panel Recent Labs    03/29/22 1421  CHOL 108  TRIG 294*  HDL 32*  LDLCALC 32  LDLDIRECT 35     Prior CV Studies:   Echocardiogram 05/01/2013: LV size normal. Systolic function was normal. Ejection fraction was estimated to be 55%. No regional wall motion abnormalities. Wall thickness was increased. Changes consistent with concentric remodeling (increased wall thickness with normal wall mass). Left ventricular diastolic function parameters were normal for the patient's age. No aortic valve regurgitation. No mitral valve regurgitation. No tricuspid valve regurgitation.     Risk Assessment/Calculations/Metrics:              ASSESSMENT & PLAN:   No problem-specific Assessment & Plan notes found for this encounter.   CAD in native artery No anginal symptoms.  Continue aspirin 81 mg daily.  Nitroglycerin sublingual as needed.  Continue carvedilol 6.25 mg p.o. twice daily, lisinopril, repatha, lipitor -check CBC and cmet  -150 min exercise weekly  Essential hypertension Blood pressure well-controlled.  Continue carvedilol 6.25 mg p.o. twice daily.  Continue lisinopril 10 mg p.o. daily.   Type 2 diabetes mellitus without complication, without long-term current use of insulin (HCC) Follows with PCP for diabetes - says recent A1c is around 7.0%     Hyperlipidemia LDL goal <70 Continue atorvastatin 40 mg p.o. daily -due for repeat-will order.           Dispo:  No follow-ups on file.   Medication Adjustments/Labs and Tests Ordered: Current medicines are reviewed at length with the patient today.  Concerns regarding medicines are outlined above.  Tests Ordered: Orders Placed This Encounter  Procedures   EKG 12-Lead   Medication Changes: No orders of the defined types were placed  in this encounter.  Signed, Jacolyn Reedy, PA-C  03/26/2023 1:29 PM    [facility] [address] [place], [place], [place]  [address] Phone: [phone]; Fax: [phone]

## 2023-03-21 DIAGNOSIS — Z299 Encounter for prophylactic measures, unspecified: Secondary | ICD-10-CM | POA: Diagnosis not present

## 2023-03-21 DIAGNOSIS — E1165 Type 2 diabetes mellitus with hyperglycemia: Secondary | ICD-10-CM | POA: Diagnosis not present

## 2023-03-21 DIAGNOSIS — I10 Essential (primary) hypertension: Secondary | ICD-10-CM | POA: Diagnosis not present

## 2023-03-21 DIAGNOSIS — R110 Nausea: Secondary | ICD-10-CM | POA: Diagnosis not present

## 2023-03-26 ENCOUNTER — Encounter: Payer: Self-pay | Admitting: Physician Assistant

## 2023-03-26 ENCOUNTER — Ambulatory Visit: Payer: 59 | Attending: Physician Assistant | Admitting: Physician Assistant

## 2023-03-26 VITALS — BP 118/60 | HR 74 | Ht 72.0 in | Wt 224.2 lb

## 2023-03-26 DIAGNOSIS — I2510 Atherosclerotic heart disease of native coronary artery without angina pectoris: Secondary | ICD-10-CM

## 2023-03-26 DIAGNOSIS — I10 Essential (primary) hypertension: Secondary | ICD-10-CM

## 2023-03-26 DIAGNOSIS — E785 Hyperlipidemia, unspecified: Secondary | ICD-10-CM

## 2023-03-26 DIAGNOSIS — E119 Type 2 diabetes mellitus without complications: Secondary | ICD-10-CM

## 2023-03-26 MED ORDER — REPATHA SURECLICK 140 MG/ML ~~LOC~~ SOAJ
SUBCUTANEOUS | 1 refills | Status: DC
Start: 2023-03-26 — End: 2024-07-18

## 2023-03-26 NOTE — Patient Instructions (Signed)
Medication Instructions:  Your physician recommends that you continue on your current medications as directed. Please refer to the Current Medication list given to you today.  *If you need a refill on your cardiac medications before your next appointment, please call your pharmacy*   Lab Work: Fasting Lipid Panel CBC CMET  If you have labs (blood work) drawn today and your tests are completely normal, you will receive your results only by: MyChart Message (if you have MyChart) OR A paper copy in the mail If you have any lab test that is abnormal or we need to change your treatment, we will call you to review the results.   Testing/Procedures: None   Follow-Up: At [facility], you and your health needs are our priority.  As part of our continuing mission to provide you with exceptional heart care, we have created designated Provider Care Teams.  These Care Teams include your primary Cardiologist (physician) and Advanced Practice Providers (APPs -  Physician Assistants and Nurse Practitioners) who all work together to provide you with the care you need, when you need it.  We recommend signing up for the patient portal called "MyChart".  Sign up information is provided on this After Visit Summary.  MyChart is used to connect with patients for Virtual Visits (Telemedicine).  Patients are able to view lab/test results, encounter notes, upcoming appointments, etc.  Non-urgent messages can be sent to your provider as well.   To learn more about what you can do with MyChart, go to [email or web address].    Your next appointment:   1 year(s)  Provider:   K. Italy Hilty, MD    Other Instructions Please get 150 minutes of exercise per week.

## 2023-03-28 DIAGNOSIS — I2510 Atherosclerotic heart disease of native coronary artery without angina pectoris: Secondary | ICD-10-CM | POA: Diagnosis not present

## 2023-03-28 DIAGNOSIS — I10 Essential (primary) hypertension: Secondary | ICD-10-CM | POA: Diagnosis not present

## 2023-03-28 DIAGNOSIS — E1151 Type 2 diabetes mellitus with diabetic peripheral angiopathy without gangrene: Secondary | ICD-10-CM | POA: Diagnosis not present

## 2023-03-28 DIAGNOSIS — E785 Hyperlipidemia, unspecified: Secondary | ICD-10-CM | POA: Diagnosis not present

## 2023-03-28 LAB — LAB REPORT - SCANNED: EGFR: 69

## 2023-04-11 DIAGNOSIS — M9905 Segmental and somatic dysfunction of pelvic region: Secondary | ICD-10-CM | POA: Diagnosis not present

## 2023-04-11 DIAGNOSIS — M9902 Segmental and somatic dysfunction of thoracic region: Secondary | ICD-10-CM | POA: Diagnosis not present

## 2023-04-11 DIAGNOSIS — M9903 Segmental and somatic dysfunction of lumbar region: Secondary | ICD-10-CM | POA: Diagnosis not present

## 2023-04-11 DIAGNOSIS — M62830 Muscle spasm of back: Secondary | ICD-10-CM | POA: Diagnosis not present

## 2023-04-20 DIAGNOSIS — Z7189 Other specified counseling: Secondary | ICD-10-CM | POA: Diagnosis not present

## 2023-04-20 DIAGNOSIS — E1159 Type 2 diabetes mellitus with other circulatory complications: Secondary | ICD-10-CM | POA: Diagnosis not present

## 2023-04-20 DIAGNOSIS — Z0000 Encounter for general adult medical examination without abnormal findings: Secondary | ICD-10-CM | POA: Diagnosis not present

## 2023-04-20 DIAGNOSIS — I25119 Atherosclerotic heart disease of native coronary artery with unspecified angina pectoris: Secondary | ICD-10-CM | POA: Diagnosis not present

## 2023-04-20 DIAGNOSIS — I10 Essential (primary) hypertension: Secondary | ICD-10-CM | POA: Diagnosis not present

## 2023-04-20 DIAGNOSIS — Z299 Encounter for prophylactic measures, unspecified: Secondary | ICD-10-CM | POA: Diagnosis not present

## 2023-04-30 DIAGNOSIS — E119 Type 2 diabetes mellitus without complications: Secondary | ICD-10-CM | POA: Diagnosis not present

## 2023-05-01 ENCOUNTER — Telehealth: Payer: Self-pay | Admitting: *Deleted

## 2023-05-01 NOTE — Progress Notes (Signed)
  Care Coordination   Note   05/01/2023 Name: Darius Rogers MRN: [id number] DOB: [birth year]  [name] is a 67 y.o. year old male who sees Kirstie Peri, MD for primary care. I reached out to Daun Peacock by phone today to offer care coordination services.  [name] was given information about Care Coordination services today including:   The Care Coordination services include support from the care team which includes your Nurse Coordinator, Clinical Social Worker, or Pharmacist.  The Care Coordination team is here to help remove barriers to the health concerns and goals most important to you. Care Coordination services are voluntary, and the patient may decline or stop services at any time by request to their care team member.   Care Coordination Consent Status: Patient agreed to services and verbal consent obtained.   Follow up plan:  Telephone appointment with care coordination team member scheduled for:  05/14/23  Encounter Outcome:  Pt. Scheduled  [facility] Coordination Care Guide  Direct Dial: [phone]

## 2023-05-13 ENCOUNTER — Other Ambulatory Visit: Payer: Self-pay | Admitting: Gastroenterology

## 2023-05-13 DIAGNOSIS — K5900 Constipation, unspecified: Secondary | ICD-10-CM

## 2023-05-14 ENCOUNTER — Ambulatory Visit: Payer: Self-pay | Admitting: *Deleted

## 2023-05-14 ENCOUNTER — Other Ambulatory Visit: Payer: Self-pay

## 2023-05-14 DIAGNOSIS — K5900 Constipation, unspecified: Secondary | ICD-10-CM

## 2023-05-14 MED ORDER — LINACLOTIDE 145 MCG PO CAPS
ORAL_CAPSULE | ORAL | 0 refills | Status: DC
Start: 2023-05-14 — End: 2023-12-05

## 2023-05-14 NOTE — Telephone Encounter (Signed)
Refill sent to pharmacy.   

## 2023-05-14 NOTE — Patient Outreach (Signed)
  Care Coordination   05/14/2023 Name: Darius Rogers MRN: [id number] DOB: Jun 21, 1956   Care Coordination Outreach Attempts:  An unsuccessful telephone outreach was attempted for a scheduled appointment today.  Follow Up Plan:  Additional outreach attempts will be made to offer the patient care coordination information and services.   Encounter Outcome:  No Answer   Care Coordination Interventions:  No, not indicated    Demetrios Loll, BSN, RN-BC RN Care Coordinator [facility]  [facility] Direct Dial: [phone] Main #: [phone]

## 2023-06-01 ENCOUNTER — Ambulatory Visit: Payer: Self-pay | Admitting: *Deleted

## 2023-06-01 ENCOUNTER — Encounter: Payer: Self-pay | Admitting: *Deleted

## 2023-06-01 NOTE — Patient Outreach (Signed)
Care Coordination   Initial Visit Note   06/01/2023 Name: Darius Rogers MRN: [id number] DOB: 09/23/1956  Darius Rogers is a 67 y.o. year old male who sees Kirstie Peri, MD for primary care. I spoke with  Darius Rogers by phone today. Patient appreciated telephone call but does not feel that he needs additional follow-up calls at this time. He will reach out if care coordination services or resources are needed in the future.  What matters to the patients health and wellness today?  Ongoing self management of medical conditions.     Goals Addressed             This Visit's Progress    COMPLETED: Care Coordination Services (No Follow-up Required)       Care Coordination Goals: Patient will check and record blood sugar daily and will reach out to provider with any readings outside of recommended range Patient will take blood sugar log to appointments for review Patient will check and record blood pressure daily and will call provider with any readings outside of recommended range Patient will take logs to appointments for review Patient will reach out to [facility] Care Management Dept at [phone] with any care coordination or resource needs           SDOH assessments and interventions completed:  Yes  SDOH Interventions Today    Flowsheet Row Most Recent Value  SDOH Interventions   Food Insecurity Interventions Intervention Not Indicated  Housing Interventions Intervention Not Indicated  Transportation Interventions Intervention Not Indicated  Physical Activity Interventions Patient Declined  Stress Interventions Intervention Not Indicated  Social Connections Interventions Intervention Not Indicated  Health Literacy Interventions Intervention Not Indicated        Care Coordination Interventions:  Yes, provided  Interventions Today    Flowsheet Row Most Recent Value  Chronic Disease   Chronic disease during today's visit Diabetes, Hypertension (HTN)  General  Interventions   General Interventions Discussed/Reviewed General Interventions Discussed, General Interventions Reviewed, Labs, Annual Eye Exam, Annual Foot Exam, Durable Medical Equipment (DME), Doctor Visits  Labs Hgb A1c every 3 months  Doctor Visits Discussed/Reviewed Doctor Visits Discussed, Doctor Visits Reviewed, Annual Wellness Visits, PCP, Specialist  Durable Medical Equipment (DME) BP Cuff, Glucomoter  [Does not check blood pressure or blood sugar regularly]  PCP/Specialist Visits Compliance with follow-up visit  [Schedule yearly cardiology follow-up in June 2025. Follow-up with PCP every 3-6 months]  Exercise Interventions   Exercise Discussed/Reviewed Exercise Discussed, Exercise Reviewed, Physical Activity, Weight Managment  Physical Activity Discussed/Reviewed Physical Activity Discussed, Physical Activity Reviewed  [Encouraged at least 150 minutes of moderate activity per week. 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 This was prior to 20lb weight loss]  Mental Health Interventions   Mental Health Discussed/Reviewed Mental Health Discussed, Mental Health Reviewed  Nutrition Interventions   Nutrition Discussed/Reviewed Nutrition Discussed, Nutrition Reviewed, Carbohydrate meal planning  [Recommend 3 meals per day with 30 GM of CHO and up to 2 snacks per day with less than 15 GM of CHO]  Pharmacy Interventions   Pharmacy Dicussed/Reviewed Pharmacy Topics Discussed, Pharmacy  Topics Reviewed, Medications and their functions  [reviewed and updated medication list. Patient is  taking medications regularly with no concerns.]       Follow up plan: No further intervention required.   Encounter Outcome:  Pt. Visit Completed   Demetrios Loll, BSN, RN-BC RN Care Coordinator [facility]  [facility] Direct Dial: [phone] Main #: [phone]

## 2023-11-07 ENCOUNTER — Other Ambulatory Visit: Payer: Self-pay | Admitting: Gastroenterology

## 2023-11-07 ENCOUNTER — Telehealth: Payer: Self-pay | Admitting: Internal Medicine

## 2023-11-07 DIAGNOSIS — K5900 Constipation, unspecified: Secondary | ICD-10-CM

## 2023-11-07 MED ORDER — DAPAGLIFLOZIN PROPANEDIOL 5 MG PO TABS: 5.0000 mg | ORAL_TABLET | Freq: Every day | ORAL | 1 refills | Status: DC

## 2023-11-07 NOTE — Telephone Encounter (Signed)
*  STAT* If patient is at the pharmacy, call can be transferred to refill team.   1. Which medications need to be refilled? (please list name of each medication and dose if known) dapagliflozin propanediol (FARXIGA) 5 MG TABS tablet   2. Which pharmacy/location (including street and city if local pharmacy) is medication to be sent to? [facility] [address], [place] - [address]   3. Do they need a 30 day or 90 day supply? 90

## 2023-11-07 NOTE — Telephone Encounter (Signed)
Please schedule the pt for an ov before anymore refills

## 2023-11-21 ENCOUNTER — Ambulatory Visit: Payer: 59 | Admitting: Nurse Practitioner

## 2023-11-21 VITALS — BP 94/60 | HR 76 | Temp 98.2°F | Ht 72.0 in | Wt 217.0 lb

## 2023-11-21 DIAGNOSIS — F17200 Nicotine dependence, unspecified, uncomplicated: Secondary | ICD-10-CM | POA: Diagnosis not present

## 2023-11-21 DIAGNOSIS — R222 Localized swelling, mass and lump, trunk: Secondary | ICD-10-CM

## 2023-11-21 DIAGNOSIS — E119 Type 2 diabetes mellitus without complications: Secondary | ICD-10-CM | POA: Diagnosis not present

## 2023-11-21 DIAGNOSIS — Z23 Encounter for immunization: Secondary | ICD-10-CM

## 2023-11-21 DIAGNOSIS — R7989 Other specified abnormal findings of blood chemistry: Secondary | ICD-10-CM | POA: Diagnosis not present

## 2023-11-21 DIAGNOSIS — I10 Essential (primary) hypertension: Secondary | ICD-10-CM

## 2023-11-21 DIAGNOSIS — Z7984 Long term (current) use of oral hypoglycemic drugs: Secondary | ICD-10-CM

## 2023-11-21 DIAGNOSIS — E663 Overweight: Secondary | ICD-10-CM | POA: Diagnosis not present

## 2023-11-21 MED ORDER — NICOTINE 7 MG/24HR TD PT24
7.0000 mg | MEDICATED_PATCH | Freq: Every day | TRANSDERMAL | 1 refills | Status: DC
Start: 2023-11-21 — End: 2023-12-05

## 2023-11-21 MED ORDER — NICOTINE 14 MG/24HR TD PT24
14.0000 mg | MEDICATED_PATCH | Freq: Every day | TRANSDERMAL | 1 refills | Status: DC
Start: 2023-11-21 — End: 2023-12-05

## 2023-11-21 NOTE — Assessment & Plan Note (Signed)
Labs ordered, further recommendations may be made based upon his results.

## 2023-11-21 NOTE — Assessment & Plan Note (Signed)
Chronic, stable A1c as reported by patient 6.7 which is at goal.  Continue metformin 1000 mg twice a day, Farxiga 5 mg daily, and Ozempic 0.5 mg weekly injection. Continue atorvastatin and Repatha. Continue lisinopril.

## 2023-11-21 NOTE — Assessment & Plan Note (Signed)
Chronic, size is increasing over the last year Does appear consistent with sebaceous cyst on exam however due to increase in size will order ultrasound for further evaluation.  Further recommendations may be made based on the results.

## 2023-11-21 NOTE — Assessment & Plan Note (Signed)
Patient would like to try nicotine patches to help with smoking cessation. Will start him on 14 mg/day patch, change patch daily.  Patient was advised to not smoke any cigarettes while using patch to prevent nicotine overdose.  Patient reports understanding.  Use 14 mg/day patch x 6-8 weeks, then 7 mg/day x 6 to 8 weeks.  Follow-up in 3 months or sooner as needed.

## 2023-11-21 NOTE — Progress Notes (Signed)
New Patient Office Visit  Subjective    Patient ID: Darius Rogers, male    DOB: [birth year]  Age: 68 y.o. MRN: [id number]  CC:  Chief Complaint  Patient presents with   chest wall mass    HPI Darius Rogers presents to establish care His main concern is that he has a chest wall mass that has been present for 3, however over the last year he reports it is tripled in size.  He reports he has had it evaluated by dermatology in the past and was told that his cyst.  It is not painful.  He also reports that he is a current smoker, has been smoking for about 26 years and reports 1/2 pack/day on average.  He would like to discuss smoking cessation.  He was able to quit for about 1 week in the past, but has not been able to be abstinent from inserts for longer than that.  His insurance company had a wellness visit with him earlier this week and he reports A1c was checked at that time it was 6.7.  Patient does have history of diabetes and is currently on Farxiga 5 mg daily, metformin 1000 mg twice daily, and Ozempic 0.5 mg/week.  He is also on Lipitor and Repatha.  He is on lisinopril.  Outpatient Encounter Medications as of 11/21/2023  Medication Sig   aspirin EC 81 MG tablet Take 81 mg by mouth daily.   atorvastatin (LIPITOR) 40 MG tablet TAKE 1 TABLET BY MOUTH ONCE DAILY AT NIGHT (Patient taking differently: Take 40 mg by mouth at bedtime.)   bisacodyl (BISACODYL) 5 MG EC tablet Take 10 mg by mouth at bedtime.   carvedilol (COREG) 6.25 MG tablet Take 6.25 mg by mouth 2 (two) times daily.   dapagliflozin propanediol (FARXIGA) 5 MG TABS tablet Take 1 tablet (5 mg total) by mouth daily.   Evolocumab (REPATHA SURECLICK) 140 MG/ML SOAJ INJECT 1 DOSE SUBCUTANEOUSLY EVERY 14 DAYS   linaclotide (LINZESS) 145 MCG CAPS capsule TAKE 1 CAPSULE BY MOUTH ONCE DAILY BEFORE BREAKFAST   LINZESS 145 MCG CAPS capsule TAKE 1 CAPSULE BY MOUTH ONCE DAILY BEFORE BREAKFAST   lisinopril (ZESTRIL) 10 MG tablet Take 10 mg  by mouth at bedtime.   loratadine (CLARITIN) 10 MG tablet Take 10 mg by mouth at bedtime.   metFORMIN (GLUCOPHAGE) 1000 MG tablet Take 1,000 mg by mouth in the morning and at bedtime.   nicotine (NICODERM CQ - DOSED IN MG/24 HOURS) 14 mg/24hr patch Place 1 patch (14 mg total) onto the skin daily.   nicotine (NICODERM CQ - DOSED IN MG/24 HR) 7 mg/24hr patch Place 1 patch (7 mg total) onto the skin daily.   nitroGLYCERIN (NITROSTAT) 0.4 MG SL tablet Place 0.4 mg under the tongue every 5 (five) minutes x 3 doses as needed for chest pain (if no relief after 2nd dose, proceed to the ED for an evaluation or call 911).   Semaglutide,0.25 or 0.5MG /DOS, (OZEMPIC, 0.25 OR 0.5 MG/DOSE,) 2 MG/1.5ML SOPN Inject 0.5 mg into the skin once a week.   No facility-administered encounter medications on file as of 11/21/2023.    Past Medical History:  Diagnosis Date   Arthritis    "knees" (11/13/2017)   CAD (coronary artery disease)    Constipation    Heart attack (HCC) 2014   DES in LAD   Hyperlipemia    Hypertension    Type II diabetes mellitus (HCC)     Past Surgical History:  Procedure Laterality Date   COLONOSCOPY     COLONOSCOPY N/A 12/29/2019   2 simple adenomas and one benign polypoid tissue in 2021. Surveillance due in 2026   CORONARY ANGIOPLASTY WITH STENT PLACEMENT  2014   CYSTOSCOPY WITH INSERTION OF UROLIFT N/A 03/11/2018   Procedure: CYSTOSCOPY WITH INSERTION OF UROLIFT;  Surgeon: Malen Gauze, MD;  Location: [facility];  Service: Urology;  Laterality: N/A;   FOREARM FRACTURE SURGERY Right 1975   FRACTURE SURGERY Right 1975   R arm    JOINT REPLACEMENT     PAROTIDECTOMY Right 02/19/2018   Procedure: RIGHT PAROTIDECTOMY;  Surgeon: Newman Pies, MD;  Location:  [facility];  Service: ENT;  Laterality: Right;  RNFA   POLYPECTOMY  12/29/2019   Procedure: POLYPECTOMY;  Surgeon: West Bali, MD;  [facility]: [facility];  Service: Endoscopy;;   TOTAL KNEE ARTHROPLASTY Right  05/08/2017   Procedure: RIGHT TOTAL KNEE ARTHROPLASTY;  Surgeon: Valeria Batman, MD;  [facility]: [facility];  Service: Orthopedics;  Laterality: Right;   TOTAL KNEE ARTHROPLASTY Left 11/13/2017   TOTAL KNEE ARTHROPLASTY Left 11/13/2017   Procedure: LEFT TOTAL KNEE ARTHROPLASTY;  Surgeon: Valeria Batman, MD;  Location: [facility];  Service: Orthopedics;  Laterality: Left;    Family History  Problem Relation Age of Onset   Hypertension Mother    Diabetes Mother    Post-traumatic stress disorder Father    Sleep apnea Father    Other Maternal Grandmother        PPM   Heart attack Maternal Grandfather    Stomach cancer Paternal Grandmother    Colon cancer Neg Hx    Colon polyps Neg Hx     Social History   Socioeconomic History   Marital status: Divorced    Spouse name: Not on file   Number of children: Not on file   Years of education: Not on file   Highest education level: Not on file  Occupational History   Not on file  Tobacco Use   Smoking status: Former    Current packs/day: 0.00    Average packs/day: 0.5 packs/day for 11.0 years (5.5 ttl pk-yrs)    Types: Cigarettes    Start date: 10/09/2004    Quit date: 05/09/2013    Years since quitting: 10.5    Passive exposure: Past   Smokeless tobacco: Never  Vaping Use   Vaping status: Never Used  Substance and Sexual Activity   Alcohol use: No    Alcohol/week: 0.0 standard drinks of alcohol   Drug use: No   Sexual activity: Not Currently  Other Topics Concern   Not on file  Social History Narrative   Not on file   Social Drivers of Health   Financial Resource Strain: Low Risk  (06/01/2023)   Overall Financial Resource Strain (CARDIA)    Difficulty of Paying Living Expenses: Not very hard  Food Insecurity: No Food Insecurity (06/01/2023)   Hunger Vital Sign    Worried About Running Out of Food in the Last Year: Never true    Ran Out of Food in the Last Year: Never true  Transportation Needs: No Transportation Needs  (06/01/2023)   PRAPARE - Administrator, Civil Service (Medical): No    Lack of Transportation (Non-Medical): No  Physical Activity: Inactive (06/01/2023)   Exercise Vital Sign    Days of Exercise per Week: 0 days    Minutes of Exercise per Session: 0 min  Stress: No Stress Concern  Present (06/01/2023)   [employer] of Occupational Health - Occupational Stress Questionnaire    Feeling of Stress : Not at all  Social Connections: Moderately Integrated (06/01/2023)   Social Connection and Isolation Panel [NHANES]    Frequency of Communication with Friends and Family: More than three times a week    Frequency of Social Gatherings with Friends and Family: More than three times a week    Attends Religious Services: Never    Database administrator or Organizations: Yes    Attends Banker Meetings: Never    Marital Status: Married  Catering manager Violence: Not on file    ROS: see HPI      Objective    BP 94/60   Pulse 76   Temp 98.2 F (36.8 C) (Temporal)   Ht 6' (1.829 m)   Wt 217 lb (98.4 kg)   SpO2 94%   BMI 29.43 kg/m   Physical Exam Vitals reviewed.  Constitutional:      Appearance: Normal appearance.  HENT:     Head: Normocephalic and atraumatic.  Cardiovascular:     Rate and Rhythm: Normal rate and regular rhythm.  Pulmonary:     Effort: Pulmonary effort is normal.     Breath sounds: Normal breath sounds.  Musculoskeletal:     Cervical back: Neck supple.  Skin:    General: Skin is warm and dry.       Neurological:     Mental Status: He is alert and oriented to person, place, and time.  Psychiatric:        Mood and Affect: Mood normal.        Behavior: Behavior normal.        Thought Content: Thought content normal.        Judgment: Judgment normal.         Assessment & Plan:   Problem List Items Addressed This Visit       Cardiovascular and Mediastinum   Essential hypertension   Chronic, stable Continue Coreg 6.25  mg twice daily and lisinopril 10 mg daily.      Relevant Orders   CBC   Comprehensive metabolic panel   Lipid panel   TSH     Endocrine   Type 2 diabetes mellitus without complication, without long-term current use of insulin (HCC)   Chronic, stable A1c as reported by patient 6.7 which is at goal.  Continue metformin 1000 mg twice a day, Farxiga 5 mg daily, and Ozempic 0.5 mg weekly injection. Continue atorvastatin and Repatha. Continue lisinopril.      Relevant Orders   CBC   Comprehensive metabolic panel   Lipid panel   TSH     Other   Elevated LFTs   Labs ordered, further recommendations may be made based upon his results        Relevant Orders   Comprehensive metabolic panel   Chest wall mass - Primary   Chronic, size is increasing over the last year Does appear consistent with sebaceous cyst on exam however due to increase in size will order ultrasound for further evaluation.  Further recommendations may be made based on the results.      Relevant Orders   [place] CHEST SOFT TISSUE   Smoker   Patient would like to try nicotine patches to help with smoking cessation. Will start him on 14 mg/day patch, change patch daily.  Patient was advised to not smoke any cigarettes while using patch to prevent nicotine overdose.  Patient reports understanding.  Use 14 mg/day patch x 6-8 weeks, then 7 mg/day x 6 to 8 weeks.  Follow-up in 3 months or sooner as needed.      Relevant Medications   nicotine (NICODERM CQ - DOSED IN MG/24 HOURS) 14 mg/24hr patch   nicotine (NICODERM CQ - DOSED IN MG/24 HR) 7 mg/24hr patch   Need for vaccination   Flu shot administered, VIS provided      Relevant Orders   Flu Vaccine Trivalent High Dose (Fluad) (Completed)   Overweight   Labs ordered, further recommendations may be made based upon his results       Relevant Orders   CBC   Comprehensive metabolic panel   Lipid panel   TSH    Return in about 3 months (around 02/18/2024) for  flu vi.   Elenore Paddy, NP

## 2023-11-21 NOTE — Assessment & Plan Note (Signed)
Chronic, stable Continue Coreg 6.25 mg twice daily and lisinopril 10 mg daily.

## 2023-11-21 NOTE — Assessment & Plan Note (Signed)
Flu shot administered, VIS provided.

## 2023-11-22 ENCOUNTER — Other Ambulatory Visit: Payer: 59

## 2023-11-22 ENCOUNTER — Encounter: Payer: Self-pay | Admitting: Nurse Practitioner

## 2023-11-22 DIAGNOSIS — R7989 Other specified abnormal findings of blood chemistry: Secondary | ICD-10-CM

## 2023-11-22 DIAGNOSIS — I10 Essential (primary) hypertension: Secondary | ICD-10-CM

## 2023-11-22 DIAGNOSIS — E119 Type 2 diabetes mellitus without complications: Secondary | ICD-10-CM | POA: Diagnosis not present

## 2023-11-22 DIAGNOSIS — E663 Overweight: Secondary | ICD-10-CM

## 2023-11-22 LAB — LIPID PANEL
Cholesterol: 106 mg/dL (ref 0–200)
HDL: 30.6 mg/dL — ABNORMAL LOW (ref >39.00)
LDL Cholesterol: 15 mg/dL (ref 0–99)
NonHDL: 74.93
Total CHOL/HDL Ratio: 3
Triglycerides: 299 mg/dL — ABNORMAL HIGH (ref 0.0–149.0)
VLDL: 59.8 mg/dL — ABNORMAL HIGH (ref 0.0–40.0)

## 2023-11-22 LAB — COMPREHENSIVE METABOLIC PANEL
ALT: 19 U/L (ref 0–53)
AST: 18 U/L (ref 0–37)
Albumin: 4.3 g/dL (ref 3.5–5.2)
Alkaline Phosphatase: 66 U/L (ref 39–117)
BUN: 16 mg/dL (ref 6–23)
CO2: 25 meq/L (ref 19–32)
Calcium: 9.4 mg/dL (ref 8.4–10.5)
Chloride: 103 meq/L (ref 96–112)
Creatinine, Ser: 1.03 mg/dL (ref 0.40–1.50)
GFR: 75.56 mL/min (ref >60.00)
Glucose, Bld: 122 mg/dL — ABNORMAL HIGH (ref 70–99)
Potassium: 4.1 meq/L (ref 3.5–5.1)
Sodium: 139 meq/L (ref 135–145)
Total Bilirubin: 0.6 mg/dL (ref 0.2–1.2)
Total Protein: 7.5 g/dL (ref 6.0–8.3)

## 2023-11-22 LAB — CBC
HCT: 49.9 % (ref 39.0–52.0)
Hemoglobin: 16.5 g/dL (ref 13.0–17.0)
MCHC: 33.1 g/dL (ref 30.0–36.0)
MCV: 90.4 fL (ref 78.0–100.0)
Platelets: 272 10*3/uL (ref 150.0–400.0)
RBC: 5.52 Mil/uL (ref 4.22–5.81)
RDW: 14.5 % (ref 11.5–15.5)
WBC: 9.1 10*3/uL (ref 4.0–10.5)

## 2023-11-22 LAB — TSH: TSH: 3.05 u[IU]/mL (ref 0.35–5.50)

## 2023-11-23 ENCOUNTER — Ambulatory Visit
Admission: RE | Admit: 2023-11-23 | Discharge: 2023-11-23 | Disposition: A | Discharge status: Home / Self Care | Payer: 59 | Source: Ambulatory Visit | Attending: Nurse Practitioner | Admitting: Nurse Practitioner

## 2023-11-23 DIAGNOSIS — R222 Localized swelling, mass and lump, trunk: Secondary | ICD-10-CM

## 2023-11-30 ENCOUNTER — Other Ambulatory Visit: Payer: Self-pay | Admitting: Nurse Practitioner

## 2023-11-30 DIAGNOSIS — R222 Localized swelling, mass and lump, trunk: Secondary | ICD-10-CM

## 2023-12-05 ENCOUNTER — Ambulatory Visit (INDEPENDENT_AMBULATORY_CARE_PROVIDER_SITE_OTHER): Payer: 59 | Admitting: Gastroenterology

## 2023-12-05 VITALS — BP 113/75 | HR 76 | Temp 97.1°F | Ht 72.0 in | Wt 217.5 lb

## 2023-12-05 DIAGNOSIS — Z860101 Personal history of adenomatous and serrated colon polyps: Secondary | ICD-10-CM

## 2023-12-05 DIAGNOSIS — K5909 Other constipation: Secondary | ICD-10-CM

## 2023-12-05 DIAGNOSIS — K760 Fatty (change of) liver, not elsewhere classified: Secondary | ICD-10-CM

## 2023-12-05 DIAGNOSIS — K5900 Constipation, unspecified: Secondary | ICD-10-CM

## 2023-12-05 MED ORDER — LINACLOTIDE 145 MCG PO CAPS: ORAL_CAPSULE | ORAL | 3 refills | Status: AC

## 2023-12-05 NOTE — Patient Instructions (Addendum)
 I have refilled Linzess for you.  I have ordered a blood test to check for risk of progression from fatty liver to stiffening/scarring. We are updating your ultrasound as well!  I am providing the mediterranean diet, this is a guideline to help you know which foods you should eat and those you should try to limit or avoid. It is important to make sure you are getting atleast 30 minutes of exercise 4-5x/week You should aim for 5-7% decrease in overall weight. Fatty liver is usually well managed with dietary and lifestyle modifications, however, in rare cases, this can progress to fibrosis/cirrhosis of the liver, which is serious. Keep up the good work of close follow-up for diabetes and cholesterol. Continue to avoid alcohol!  Your next colonoscopy will be in 2026!  We will see you back in 1 year.   I enjoyed seeing you again today! I value our relationship and want to provide genuine, compassionate, and quality care. You may receive a survey regarding your visit with me, and I welcome your feedback! Thanks so much for taking the time to complete this. I look forward to seeing you again.      Gelene Mink, PhD, ANP-BC [facility]

## 2023-12-05 NOTE — Progress Notes (Signed)
 Gastroenterology Office Note     Primary Care Physician:  Elenore Paddy, NP  Primary Gastroenterologist: Dr. Marletta Lor   Chief Complaint   Chief Complaint  Patient presents with   Constipation    Follow up on constipation. Takes linzess and states working well. Needs refills.      History of Present Illness   Darius Rogers is a 68 y.o. male presenting today with a history of chronic constipation, adenomas with surveillance due in 2026, and hepatic steatosis.   No abdominal pain, N/V, changes in bowel habits, diarrhea, overt GI bleeding, GERD, dysphagia, unexplained weight loss, lack of appetite, unexplained weight gain.   Linzess 145 mcg working well for patient. Needs refills.   Denies alcohol use. Recent labs reviewed from Feb 2025 with normal LFTs, normal platelets. Diabetes fairly well-controlled. Dyslipidemia noted on labs. Known liver cysts on [place] in 2023.   NAFLD score: indeterminate.  FIB 4: 1 (advanced fibrosis excluded. Due to age, interpret cautiously).    Last colonoscopy in 2021 with adenomas and due for surveillance in 2026.   Past Medical History:  Diagnosis Date   Arthritis    "knees" (11/13/2017)   CAD (coronary artery disease)    Constipation    Heart attack (HCC) 2014   DES in LAD   Hyperlipemia    Hypertension    Type II diabetes mellitus (HCC)     Past Surgical History:  Procedure Laterality Date   COLONOSCOPY     COLONOSCOPY N/A 12/29/2019   2 simple adenomas and one benign polypoid tissue in 2021. Surveillance due in 2026   CORONARY ANGIOPLASTY WITH STENT PLACEMENT  2014   CYSTOSCOPY WITH INSERTION OF UROLIFT N/A 03/11/2018   Procedure: CYSTOSCOPY WITH INSERTION OF UROLIFT;  Surgeon: Malen Gauze, MD;  Location: [facility];  Service: Urology;  Laterality: N/A;   FOREARM FRACTURE SURGERY Right 1975   FRACTURE SURGERY Right 1975   R arm    JOINT REPLACEMENT     PAROTIDECTOMY Right 02/19/2018   Procedure: RIGHT PAROTIDECTOMY;   Surgeon: Newman Pies, MD;  Location: [facility];  Service: ENT;  Laterality: Right;  RNFA   POLYPECTOMY  12/29/2019   Procedure: POLYPECTOMY;  Surgeon: West Bali, MD;  [facility];  Service: Endoscopy;;   TOTAL KNEE ARTHROPLASTY Right 05/08/2017   Procedure: RIGHT TOTAL KNEE ARTHROPLASTY;  Surgeon: Valeria Batman, MD;  [facility];  Service: Orthopedics;  Laterality: Right;   TOTAL KNEE ARTHROPLASTY Left 11/13/2017   TOTAL KNEE ARTHROPLASTY Left 11/13/2017   Procedure: LEFT TOTAL KNEE ARTHROPLASTY;  Surgeon: Valeria Batman, MD;  Location: [facility];  Service: Orthopedics;  Laterality: Left;    Current Outpatient Medications  Medication Sig Dispense Refill   aspirin EC 81 MG tablet Take 81 mg by mouth daily.     atorvastatin (LIPITOR) 40 MG tablet TAKE 1 TABLET BY MOUTH ONCE DAILY AT NIGHT (Patient taking differently: Take 40 mg by mouth at bedtime.) 90 tablet 3   bisacodyl (BISACODYL) 5 MG EC tablet Take 10 mg by mouth at bedtime.     carvedilol (COREG) 6.25 MG tablet Take 6.25 mg by mouth 2 (two) times daily.     dapagliflozin propanediol (FARXIGA) 5 MG TABS tablet Take 1 tablet (5 mg total) by mouth daily. 90 tablet 1   Evolocumab (REPATHA SURECLICK) 140 MG/ML SOAJ INJECT 1 DOSE SUBCUTANEOUSLY EVERY 14 DAYS 6 mL 1   LINZESS 145 MCG CAPS capsule TAKE 1  CAPSULE BY MOUTH ONCE DAILY BEFORE BREAKFAST 90 capsule 0   lisinopril (ZESTRIL) 10 MG tablet Take 10 mg by mouth at bedtime.     loratadine (CLARITIN) 10 MG tablet Take 10 mg by mouth at bedtime.     metFORMIN (GLUCOPHAGE) 1000 MG tablet Take 1,000 mg by mouth in the morning and at bedtime.     Semaglutide,0.25 or 0.5MG /DOS, (OZEMPIC, 0.25 OR 0.5 MG/DOSE,) 2 MG/1.5ML SOPN Inject 0.5 mg into the skin once a week.     linaclotide (LINZESS) 145 MCG CAPS capsule TAKE 1 CAPSULE BY MOUTH ONCE DAILY BEFORE BREAKFAST 90 capsule 3   No current facility-administered medications for this visit.    Allergies as  of 12/05/2023 - Review Complete 12/05/2023  Allergen Reaction Noted   Bactrim [sulfamethoxazole-trimethoprim] Hives 02/12/2018    Family History  Problem Relation Age of Onset   Hypertension Mother    Diabetes Mother    Post-traumatic stress disorder Father    Sleep apnea Father    Other Maternal Grandmother        PPM   Heart attack Maternal Grandfather    Stomach cancer Paternal Grandmother    Colon cancer Neg Hx    Colon polyps Neg Hx     Social History   Socioeconomic History   Marital status: Divorced    Spouse name: Not on file   Number of children: Not on file   Years of education: Not on file   Highest education level: Not on file  Occupational History   Not on file  Tobacco Use   Smoking status: Former    Current packs/day: 0.00    Average packs/day: 0.5 packs/day for 11.0 years (5.5 ttl pk-yrs)    Types: Cigarettes    Start date: 10/09/2004    Quit date: 05/09/2013    Years since quitting: 10.5    Passive exposure: Past   Smokeless tobacco: Never  Vaping Use   Vaping status: Never Used  Substance and Sexual Activity   Alcohol use: No    Alcohol/week: 0.0 standard drinks of alcohol   Drug use: No   Sexual activity: Not Currently  Other Topics Concern   Not on file  Social History Narrative   Not on file   Social Drivers of Health   Financial Resource Strain: Low Risk  (06/01/2023)   Overall Financial Resource Strain (CARDIA)    Difficulty of Paying Living Expenses: Not very hard  Food Insecurity: No Food Insecurity (06/01/2023)   Hunger Vital Sign    Worried About Running Out of Food in the Last Year: Never true    Ran Out of Food in the Last Year: Never true  Transportation Needs: No Transportation Needs (06/01/2023)   PRAPARE - Administrator, Civil Service (Medical): No    Lack of Transportation (Non-Medical): No  Physical Activity: Inactive (06/01/2023)   Exercise Vital Sign    Days of Exercise per Week: 0 days    Minutes of Exercise  per Session: 0 min  Stress: No Stress Concern Present (06/01/2023)   [employer] of Occupational Health - Occupational Stress Questionnaire    Feeling of Stress : Not at all  Social Connections: Moderately Integrated (06/01/2023)   Social Connection and Isolation Panel [NHANES]    Frequency of Communication with Friends and Family: More than three times a week    Frequency of Social Gatherings with Friends and Family: More than three times a week    Attends Religious Services: Never  Active Member of Clubs or Organizations: Yes    Attends Banker Meetings: Never    Marital Status: Married  Catering manager Violence: Not on file     Review of Systems   Gen: Denies any fever, chills, fatigue, weight loss, lack of appetite.  CV: Denies chest pain, heart palpitations, peripheral edema, syncope.  Resp: Denies shortness of breath at rest or with exertion. Denies wheezing or cough.  GI: Denies dysphagia or odynophagia. Denies jaundice, hematemesis, fecal incontinence. GU : Denies urinary burning, urinary frequency, urinary hesitancy MS: Denies joint pain, muscle weakness, cramps, or limitation of movement.  Derm: Denies rash, itching, dry skin Psych: Denies depression, anxiety, memory loss, and confusion Heme: Denies bruising, bleeding, and enlarged lymph nodes.   Physical Exam   BP 113/75   Pulse 76   Temp (!) 97.1 F (36.2 C)   Ht 6' (1.829 m)   Wt 217 lb 8 oz (98.7 kg)   BMI 29.50 kg/m  General:   Alert and oriented. Pleasant and cooperative. Well-nourished and well-developed.  Head:  Normocephalic and atraumatic. Eyes:  Without icterus Abdomen:  +BS, soft, non-tender and non-distended. Liver margin palpable across midline Rectal:  Deferred  Msk:  Symmetrical without gross deformities. Normal posture. Extremities:  Without edema. Neurologic:  Alert and  oriented x4;  grossly normal neurologically. Skin:  Intact without significant lesions or  rashes. Psych:  Alert and cooperative. Normal mood and affect.   Assessment   Darius Rogers is a 68 y.o. male presenting today with a history of chronic constipation, adenomas with surveillance due in 2026, and hepatic steatosis.    Chronic constipation: managed well with Linzess 145 mcg daily.  Hx of colonic adenomas: surveillance due in 2026. No concerning lower or upper GI signs/symptoms.  Hepatic steatosis: with known innumerable cysts on [place] in 2023. Risk factors for MASH including obesity, dyslipidemia, diabetes. LFTs normal, platelets normal, no splenomegaly in past. He does have palpable liver margin crossing the midline. Will update [place].  NAFLD score indeterminate and FIB 4 with advanced fibrosis excluded (although interpret cautiously due to age). Will check ELF to help guide further management. Continue with avoidance of alcohol and management of comorbidities.      PLAN    Linzess 145 mcg refilled Check ELF score, continue with diet/behavior changes, management of comorbidities, mediterranean diet provided.  Return in 1 year or sooner if labs prompt otherwise Colonoscopy in 2026   Gelene Mink, PhD, ANP-BC [facility]

## 2023-12-06 ENCOUNTER — Ambulatory Visit (HOSPITAL_COMMUNITY)
Admission: RE | Admit: 2023-12-06 | Discharge: 2023-12-06 | Disposition: A | Discharge status: Home / Self Care | Payer: 59 | Source: Ambulatory Visit | Attending: Gastroenterology | Admitting: Gastroenterology

## 2023-12-06 DIAGNOSIS — K760 Fatty (change of) liver, not elsewhere classified: Secondary | ICD-10-CM | POA: Insufficient documentation

## 2023-12-06 DIAGNOSIS — R160 Hepatomegaly, not elsewhere classified: Secondary | ICD-10-CM | POA: Diagnosis not present

## 2023-12-06 DIAGNOSIS — K7689 Other specified diseases of liver: Secondary | ICD-10-CM | POA: Diagnosis not present

## 2023-12-10 ENCOUNTER — Other Ambulatory Visit (HOSPITAL_COMMUNITY): Payer: Self-pay

## 2023-12-13 ENCOUNTER — Ambulatory Visit (INDEPENDENT_AMBULATORY_CARE_PROVIDER_SITE_OTHER)

## 2023-12-13 VITALS — Ht 72.0 in | Wt 217.0 lb

## 2023-12-13 DIAGNOSIS — Z0000 Encounter for general adult medical examination without abnormal findings: Secondary | ICD-10-CM

## 2023-12-13 DIAGNOSIS — E119 Type 2 diabetes mellitus without complications: Secondary | ICD-10-CM | POA: Diagnosis not present

## 2023-12-13 DIAGNOSIS — R222 Localized swelling, mass and lump, trunk: Secondary | ICD-10-CM | POA: Diagnosis not present

## 2023-12-13 NOTE — Patient Instructions (Addendum)
 [name] , Thank you for taking time to come for your Medicare Wellness Visit. I appreciate your ongoing commitment to your health goals. Please review the following plan we discussed and let me know if I can assist you in the future.   Referrals/Orders/Follow-Ups/Clinician Recommendations: Aim for 30 minutes of exercise or brisk walking, 6-8 glasses of water, and 5 servings of fruits and vegetables each day. Educated and advised on the Pneumonia, Shingles, Tdap, and COVID vaccines.    This is a list of the screening recommended for you and due dates:  Health Maintenance  Topic Date Due   Pneumonia Vaccine (1 of 2 - PCV) Never done   Complete foot exam   Never done   Eye exam for diabetics  Never done   Yearly kidney health urinalysis for diabetes  Never done   DTaP/Tdap/Td vaccine (1 - Tdap) Never done   Zoster (Shingles) Vaccine (1 of 2) Never done   Hemoglobin A1C  02/11/2020   COVID-19 Vaccine (1 - 2024-25 season) Never done   Yearly kidney function blood test for diabetes  11/21/2024   Medicare Annual Wellness Visit  12/12/2024   Colon Cancer Screening  12/28/2029   Flu Shot  Completed   Hepatitis C Screening  Completed   HPV Vaccine  Aged Out    Advanced directives: (Provided) Advance directive discussed with you today. I have provided a copy for you to complete at home and have notarized. Once this is complete, please bring a copy in to our office so we can scan it into your chart.   Next Medicare Annual Wellness Visit scheduled for next year: Yes - 2026

## 2023-12-13 NOTE — Progress Notes (Signed)
 Subjective:  Please attest and cosign this visit due to patients primary care provider not being in the office at the time the visit was completed.  (Pt of Jiles Prows, NP)   Darius Rogers is a 68 y.o. who presents for a Medicare Wellness preventive visit.  Visit Complete: Virtual I connected with  Darius Rogers on 12/13/23 by a audio enabled telemedicine application and verified that I am speaking with the correct person using two identifiers.  Patient Location: Home  Provider Location: Office/Clinic  I discussed the limitations of evaluation and management by telemedicine. The patient expressed understanding and agreed to proceed.  Vital Signs: Because this visit was a virtual/telehealth visit, some criteria may be missing or patient reported. Any vitals not documented were not able to be obtained and vitals that have been documented are patient reported.  VideoDeclined- This patient declined Librarian, academic. Therefore the visit was completed with audio only.  AWV Questionnaire: No: Patient Medicare AWV questionnaire was not completed prior to this visit.  Cardiac Risk Factors include: advanced age (>73men, >64 women);hypertension;diabetes mellitus;male gender     Objective:    Today's Vitals   12/13/23 0806  Weight: 217 lb (98.4 kg)  Height: 6' (1.829 m)   Body mass index is 29.43 kg/m.     12/13/2023    8:04 AM 12/29/2019    8:36 AM 08/20/2019    9:06 AM 08/19/2018   12:03 PM 04/01/2018    8:31 AM 03/11/2018    8:11 AM 03/06/2018    2:01 PM  Advanced Directives  Does Patient Have a Medical Advance Directive? No No No No No No No  Would patient like information on creating a medical advance directive? Yes (MAU/Ambulatory/Procedural Areas - Information given) No - Patient declined No - Patient declined  No - Patient declined No - Patient declined No - Patient declined    Current Medications (verified) Outpatient Encounter Medications as  of 12/13/2023  Medication Sig   aspirin EC 81 MG tablet Take 81 mg by mouth daily.   atorvastatin (LIPITOR) 40 MG tablet TAKE 1 TABLET BY MOUTH ONCE DAILY AT NIGHT (Patient taking differently: Take 40 mg by mouth at bedtime.)   bisacodyl (BISACODYL) 5 MG EC tablet Take 10 mg by mouth at bedtime.   carvedilol (COREG) 6.25 MG tablet Take 6.25 mg by mouth 2 (two) times daily.   dapagliflozin propanediol (FARXIGA) 5 MG TABS tablet Take 1 tablet (5 mg total) by mouth daily.   Evolocumab (REPATHA SURECLICK) 140 MG/ML SOAJ INJECT 1 DOSE SUBCUTANEOUSLY EVERY 14 DAYS   linaclotide (LINZESS) 145 MCG CAPS capsule TAKE 1 CAPSULE BY MOUTH ONCE DAILY BEFORE BREAKFAST   lisinopril (ZESTRIL) 10 MG tablet Take 10 mg by mouth at bedtime.   loratadine (CLARITIN) 10 MG tablet Take 10 mg by mouth at bedtime.   metFORMIN (GLUCOPHAGE) 1000 MG tablet Take 1,000 mg by mouth in the morning and at bedtime.   Semaglutide,0.25 or 0.5MG /DOS, (OZEMPIC, 0.25 OR 0.5 MG/DOSE,) 2 MG/1.5ML SOPN Inject 0.5 mg into the skin once a week.   [DISCONTINUED] LINZESS 145 MCG CAPS capsule TAKE 1 CAPSULE BY MOUTH ONCE DAILY BEFORE BREAKFAST   No facility-administered encounter medications on file as of 12/13/2023.    Allergies (verified) Bactrim [sulfamethoxazole-trimethoprim]   History: Past Medical History:  Diagnosis Date   Arthritis    "knees" (11/13/2017)   CAD (coronary artery disease)    Constipation    Heart attack (HCC) 2014  DES in LAD   Hyperlipemia    Hypertension    Type II diabetes mellitus (HCC)    Past Surgical History:  Procedure Laterality Date   COLONOSCOPY     COLONOSCOPY N/A 12/29/2019   2 simple adenomas and one benign polypoid tissue in 2021. Surveillance due in 2026   CORONARY ANGIOPLASTY WITH STENT PLACEMENT  2014   CYSTOSCOPY WITH INSERTION OF UROLIFT N/A 03/11/2018   Procedure: CYSTOSCOPY WITH INSERTION OF UROLIFT;  Surgeon: Malen Gauze, MD;  Location: [facility];  Service: Urology;  Laterality:  N/A;   FOREARM FRACTURE SURGERY Right 1975   FRACTURE SURGERY Right 1975   R arm    JOINT REPLACEMENT     PAROTIDECTOMY Right 02/19/2018   Procedure: RIGHT PAROTIDECTOMY;  Surgeon: Newman Pies, MD;  Location: [facility];  Service: ENT;  Laterality: Right;  RNFA   POLYPECTOMY  12/29/2019   Procedure: POLYPECTOMY;  Surgeon: West Bali, MD;  Location: [facility];  Service: Endoscopy;;   TOTAL KNEE ARTHROPLASTY Right 05/08/2017   Procedure: RIGHT TOTAL KNEE ARTHROPLASTY;  Surgeon: Valeria Batman, MD;  Location: [facility];  Service: Orthopedics;  Laterality: Right;   TOTAL KNEE ARTHROPLASTY Left 11/13/2017   TOTAL KNEE ARTHROPLASTY Left 11/13/2017   Procedure: LEFT TOTAL KNEE ARTHROPLASTY;  Surgeon: Valeria Batman, MD;  Location: [facility];  Service: Orthopedics;  Laterality: Left;   Family History  Problem Relation Age of Onset   Hypertension Mother    Diabetes Mother    Post-traumatic stress disorder Father    Sleep apnea Father    Other Maternal Grandmother        PPM   Heart attack Maternal Grandfather    Stomach cancer Paternal Grandmother    Colon cancer Neg Hx    Colon polyps Neg Hx    Social History   Socioeconomic History   Marital status: Married    Spouse name: Not on file   Number of children: Not on file   Years of education: Not on file   Highest education level: Not on file  Occupational History   Not on file  Tobacco Use   Smoking status: Former    Current packs/day: 0.00    Average packs/day: 0.5 packs/day for 11.0 years (5.5 ttl pk-yrs)    Types: Cigarettes    Start date: 10/09/2004    Quit date: 05/09/2013    Years since quitting: 10.6    Passive exposure: Past   Smokeless tobacco: Never  Vaping Use   Vaping status: Never Used  Substance and Sexual Activity   Alcohol use: No    Alcohol/week: 0.0 standard drinks of alcohol   Drug use: No   Sexual activity: Not Currently  Other Topics Concern   Not on file  Social History Narrative    Not on file   Social Drivers of Health   Financial Resource Strain: Low Risk  (12/13/2023)   Overall Financial Resource Strain (CARDIA)    Difficulty of Paying Living Expenses: Not hard at all  Food Insecurity: No Food Insecurity (12/13/2023)   Hunger Vital Sign    Worried About Running Out of Food in the Last Year: Never true    Ran Out of Food in the Last Year: Never true  Transportation Needs: No Transportation Needs (12/13/2023)   PRAPARE - Administrator, Civil Service (Medical): No    Lack of Transportation (Non-Medical): No  Physical Activity: Sufficiently Active (12/13/2023)   Exercise Vital Sign  Days of Exercise per Week: 7 days    Minutes of Exercise per Session: 60 min  Recent Concern: Physical Activity - Insufficiently Active (12/13/2023)   Exercise Vital Sign    Days of Exercise per Week: 2 days    Minutes of Exercise per Session: 30 min  Stress: No Stress Concern Present (12/13/2023)   [employer] of Occupational Health - Occupational Stress Questionnaire    Feeling of Stress : Not at all  Social Connections: Moderately Integrated (12/13/2023)   Social Connection and Isolation Panel [NHANES]    Frequency of Communication with Friends and Family: More than three times a week    Frequency of Social Gatherings with Friends and Family: Never    Attends Religious Services: 1 to 4 times per year    Active Member of [employer] or Organizations: No    Attends Engineer, structural: Never    Marital Status: Married    Tobacco Counseling Counseling given: Not Answered    Clinical Intake:  Pre-visit preparation completed: Yes  Pain : No/denies pain     BMI - recorded: 29.43 Nutritional Status: BMI 25 -29 Overweight Nutritional Risks: None Diabetes: Yes CBG done?: No Did pt. bring in CBG monitor from home?: No  How often do you need to have someone help you when you read instructions, pamphlets, or other written materials from your doctor or  pharmacy?: 1 - Never  Interpreter Needed?: No  Information entered by :: Hassell Halim, CMA   Activities of Daily Living     12/13/2023    8:07 AM  In your present state of health, do you have any difficulty performing the following activities:  Hearing? 0  Vision? 0  Difficulty concentrating or making decisions? 0  Walking or climbing stairs? 0  Dressing or bathing? 0  Doing errands, shopping? 0  Preparing Food and eating ? N  Using the Toilet? N  In the past six months, have you accidently leaked urine? N  Do you have problems with loss of bowel control? N  Managing your Medications? N  Managing your Finances? N  Housekeeping or managing your Housekeeping? N    Patient Care Team: Elenore Paddy, NP as PCP - General (Nurse Practitioner) Chrystie Nose, MD as PCP - Cardiology (Cardiology) West Bali, MD (Inactive) as Consulting Physician (Gastroenterology)  Indicate any recent Medical Services you may have received from other than [facility] providers in the past year (date may be approximate).     Assessment:   This is a routine wellness examination for Darius Rogers.  Hearing/Vision screen Hearing Screening - Comments:: Denies hearing difficulties   Vision Screening - Comments:: Wears rx glasses - up to date with routine eye exams with [facility]   Goals Addressed               This Visit's Progress     Patient Stated (pt-stated)        Patient stated he plans to work better on diet (eating more fruit) and joined a gym.       Depression Screen     12/13/2023    8:11 AM 11/21/2023    1:38 PM 02/17/2015    2:04 PM  PHQ 2/9 Scores  PHQ - 2 Score 0 0 4  PHQ- 9 Score 0  7    Fall Risk     12/13/2023    8:12 AM 11/21/2023    1:38 PM 02/17/2015    2:04 PM  Fall Risk  Falls in the past year? 0 0 No  Number falls in past yr: 0 0   Injury with Fall? 0 0   Risk for fall due to : No Fall Risks No Fall Risks   Follow up Falls prevention discussed;Falls evaluation  completed Falls evaluation completed     MEDICARE RISK AT HOME:  Medicare Risk at Home Any stairs in or around the home?: Yes (outside) If so, are there any without handrails?: Yes (no due to 1 step need) Home free of loose throw rugs in walkways, pet beds, electrical cords, etc?: Yes Adequate lighting in your home to reduce risk of falls?: Yes Life alert?: No Use of a cane, walker or w/c?: No Grab bars in the bathroom?: No Shower chair or bench in shower?: Yes Elevated toilet seat or a handicapped toilet?: No  TIMED UP AND GO:  Was the test performed?  No  Cognitive Function: 6CIT completed        12/13/2023    8:12 AM  6CIT Screen  What Year? 0 points  What month? 0 points  What time? 0 points  Count back from 20 0 points  Months in reverse 0 points  Repeat phrase 2 points  Total Score 2 points    Immunizations Immunization History  Administered Date(s) Administered   Fluad Trivalent(High Dose 65+) 11/21/2023   Influenza-Unspecified 07/30/2019    Screening Tests Health Maintenance  Topic Date Due   Pneumonia Vaccine 34+ Years old (1 of 2 - PCV) Never done   FOOT EXAM  Never done   OPHTHALMOLOGY EXAM  Never done   Diabetic kidney evaluation - Urine ACR  Never done   DTaP/Tdap/Td (1 - Tdap) Never done   Zoster Vaccines- Shingrix (1 of 2) Never done   HEMOGLOBIN A1C  02/11/2020   COVID-19 Vaccine (1 - 2024-25 season) Never done   Diabetic kidney evaluation - eGFR measurement  11/21/2024   Medicare Annual Wellness (AWV)  12/12/2024   Colonoscopy  12/28/2029   INFLUENZA VACCINE  Completed   Hepatitis C Screening  Completed   HPV VACCINES  Aged Out    Health Maintenance  Health Maintenance Due  Topic Date Due   Pneumonia Vaccine 108+ Years old (1 of 2 - PCV) Never done   FOOT EXAM  Never done   OPHTHALMOLOGY EXAM  Never done   Diabetic kidney evaluation - Urine ACR  Never done   DTaP/Tdap/Td (1 - Tdap) Never done   Zoster Vaccines- Shingrix (1 of 2)  Never done   HEMOGLOBIN A1C  02/11/2020   COVID-19 Vaccine (1 - 2024-25 season) Never done   Health Maintenance Items Addressed: 12/13/2023 - Educated and advised on the Pneumonia, Shingles, Tdap, and COVID vaccines.    Additional Screening:  Vision Screening: Recommended annual ophthalmology exams for early detection of glaucoma and other disorders of the eye.  Dental Screening: Recommended annual dental exams for proper oral hygiene  Community Resource Referral / Chronic Care Management: CRR required this visit?  No   CCM required this visit?  No     Plan:     I have personally reviewed and noted the following in the patient's chart:   Medical and social history Use of alcohol, tobacco or illicit drugs  Current medications and supplements including opioid prescriptions. Patient is not currently taking opioid prescriptions. Functional ability and status Nutritional status Physical activity Advanced directives List of other physicians Hospitalizations, surgeries, and ER visits in previous 12 months Vitals Screenings to include  cognitive, depression, and falls Referrals and appointments  In addition, I have reviewed and discussed with patient certain preventive protocols, quality metrics, and best practice recommendations. A written personalized care plan for preventive services as well as general preventive health recommendations were provided to patient.     Darius Rogers, CMA   12/13/2023   After Visit Summary: (MyChart) Due to this being a telephonic visit, the after visit summary with patients personalized plan was offered to patient via MyChart   Notes: Nothing significant to report at this time.

## 2023-12-14 ENCOUNTER — Other Ambulatory Visit: Payer: Self-pay | Admitting: Nurse Practitioner

## 2023-12-14 NOTE — Telephone Encounter (Signed)
 Copied from CRM [id number]. Topic: Clinical - Medication Refill >> Dec 14, 2023 10:37 AM Elizebeth Brooking wrote: Most Recent Primary Care Visit:  Provider: [facility]  Department: [facility]  Visit Type: LAB  Date: 11/22/2023  Medication: metFORMIN (GLUCOPHAGE) 1000 MG tablet  Has the patient contacted their pharmacy? Yes (Agent: If no, request that the patient contact the pharmacy for the refill. If patient does not wish to contact the pharmacy document the reason why and proceed with request.) (Agent: If yes, when and what did the pharmacy advise?)  Is this the correct pharmacy for this prescription? Yes If no, delete pharmacy and type the correct one.  This is the patient's preferred pharmacy:  [facility] [address] [place] - [address] [place] [address] Phone: [phone] Fax: [phone]   Has the prescription been filled recently? No  Is the patient out of the medication? Yes  Has the patient been seen for an appointment in the last year OR does the patient have an upcoming appointment? Yes  Can we respond through MyChart? Yes  Agent: Please be advised that Rx refills may take up to 3 business days. We ask that you follow-up with your pharmacy.

## 2023-12-17 MED ORDER — METFORMIN HCL 1000 MG PO TABS: 1000.0000 mg | ORAL_TABLET | Freq: Two times a day (BID) | ORAL | 1 refills | Status: DC

## 2024-01-14 ENCOUNTER — Other Ambulatory Visit: Payer: Self-pay | Admitting: General Surgery

## 2024-01-14 DIAGNOSIS — L723 Sebaceous cyst: Secondary | ICD-10-CM | POA: Diagnosis not present

## 2024-01-14 DIAGNOSIS — L720 Epidermal cyst: Secondary | ICD-10-CM | POA: Diagnosis not present

## 2024-01-16 LAB — SURGICAL PATHOLOGY

## 2024-02-07 ENCOUNTER — Other Ambulatory Visit: Payer: Self-pay | Admitting: Nurse Practitioner

## 2024-02-07 NOTE — Telephone Encounter (Signed)
 Copied from CRM [id number]. Topic: Clinical - Medication Refill >> Feb 07, 2024  8:51 AM Darius Rogers E wrote: Most Recent Primary Care Visit:  Provider: Patria Bookbinder  Department: [facility] [place]  Visit Type: ANNUAL WELL VISIT, INITIAL  Date: 12/13/2023  Medication: Semaglutide,0.25 or 0.5MG /DOS, (OZEMPIC, 0.25 OR 0.5 MG/DOSE,) 2 MG/1.5ML SOPN  Has the patient contacted their pharmacy? No (Agent: If no, request that the patient contact the pharmacy for the refill. If patient does not wish to contact the pharmacy document the reason why and proceed with request.) (Agent: If yes, when and what did the pharmacy advise?)  Is this the correct pharmacy for this prescription? Yes If no, delete pharmacy and type the correct one.  This is the patient's preferred pharmacy:  [facility] [address] [place] - [address] [place] [address] Phone: [phone] Fax: [phone]   Has the prescription been filled recently? No  Is the patient out of the medication? Yes  Has the patient been seen for an appointment in the last year OR does the patient have an upcoming appointment? Yes  Can we respond through MyChart? Yes  Agent: Please be advised that Rx refills may take up to 3 business days. We ask that you follow-up with your pharmacy.

## 2024-02-07 NOTE — Telephone Encounter (Signed)
 Refill for Semaglutide pending. Per CRM, pt requested a refill of sildenafil 40mg . RN unable to find sildenafil in pt's current or past medications. RN called pt and LVM with a callback number for more information.  Copied from CRM [id number]. Topic: Clinical - Medication Refill >> Feb 07, 2024  8:53 AM Alethia Huxley E wrote: Most Recent Primary Care Visit:  Provider: Patria Bookbinder  Department: [facility] [place]  Visit Type: ANNUAL WELL VISIT, INITIAL  Date: 12/13/2023  Medication: Sildenafil 100 MG tablet  Has the patient contacted their pharmacy? No (Agent: If no, request that the patient contact the pharmacy for the refill. If patient does not wish to contact the pharmacy document the reason why and proceed with request.) (Agent: If yes, when and what did the pharmacy advise?)  Is this the correct pharmacy for this prescription? Yes If no, delete pharmacy and type the correct one.  This is the patient's preferred pharmacy:  [facility] [address] [place] - [address] [place] [address] Phone: [phone] Fax: [phone]   Has the prescription been filled recently? No  Is the patient out of the medication? Yes  Has the patient been seen for an appointment in the last year OR does the patient have an upcoming appointment? Yes  Can we respond through MyChart? Yes  Agent: Please be advised that Rx refills may take up to 3 business days. We ask that you follow-up with your pharmacy.

## 2024-02-12 ENCOUNTER — Ambulatory Visit: Admitting: Family Medicine

## 2024-02-12 ENCOUNTER — Encounter: Payer: Self-pay | Admitting: Family Medicine

## 2024-02-12 VITALS — BP 120/80 | HR 70 | Temp 98.5°F | Ht 72.0 in | Wt 213.0 lb

## 2024-02-12 DIAGNOSIS — N529 Male erectile dysfunction, unspecified: Secondary | ICD-10-CM

## 2024-02-12 DIAGNOSIS — Z79899 Other long term (current) drug therapy: Secondary | ICD-10-CM | POA: Diagnosis not present

## 2024-02-12 DIAGNOSIS — Z7985 Long-term (current) use of injectable non-insulin antidiabetic drugs: Secondary | ICD-10-CM

## 2024-02-12 DIAGNOSIS — Z6828 Body mass index (BMI) 28.0-28.9, adult: Secondary | ICD-10-CM

## 2024-02-12 DIAGNOSIS — E1165 Type 2 diabetes mellitus with hyperglycemia: Secondary | ICD-10-CM

## 2024-02-12 DIAGNOSIS — T8130XA Disruption of wound, unspecified, initial encounter: Secondary | ICD-10-CM

## 2024-02-12 DIAGNOSIS — E663 Overweight: Secondary | ICD-10-CM

## 2024-02-12 MED ORDER — SILDENAFIL CITRATE 100 MG PO TABS: 50.0000 mg | ORAL_TABLET | Freq: Every day | ORAL | 3 refills | Status: AC | PRN

## 2024-02-12 MED ORDER — SEMAGLUTIDE (1 MG/DOSE) 4 MG/3ML ~~LOC~~ SOPN: 1.0000 mg | PEN_INJECTOR | SUBCUTANEOUS | 1 refills | Status: DC

## 2024-02-12 NOTE — Progress Notes (Addendum)
 Acute Office Visit  Subjective:     Patient ID: Darius Rogers, male    DOB: 1956-03-05, 68 y.o.   MRN: [id number]  Chief Complaint  Patient presents with   Medication Refill   Dressing Change    Medication Refill   Patient is in today for evaluation of open wound just below the right pec.  Had cyst removal to the same area couple of weeks.   Reports that the area has opened even more than it was when he saw his surgeon on 02/06/2024. Per chart review and note reviewed from Dr. Dorrie Gaudier, follow-up in a month and apply bacitracin.  Requesting refill on increased dose of Ozempic , as well as refill of sildenafil  today. Denies GI effects with Ozempic  at 0.5 mg dose including constipation, diarrhea, nausea, vomiting, other concerns.    ROS Per HPI      Objective:    BP 120/80 (BP Location: Left Arm, Patient Position: Sitting)   Pulse 70   Temp 98.5 F (36.9 C) (Temporal)   Ht 6' (1.829 m)   Wt 213 lb (96.6 kg)   SpO2 94%   BMI 28.89 kg/m    Physical Exam Vitals and nursing note reviewed.  Constitutional:      General: He is not in acute distress.    Appearance: Normal appearance.  HENT:     Head: Normocephalic and atraumatic.     Right Ear: External ear normal.     Left Ear: External ear normal.  Eyes:     Extraocular Movements: Extraocular movements intact.  Cardiovascular:     Rate and Rhythm: Normal rate and regular rhythm.     Pulses: Normal pulses.     Heart sounds: Normal heart sounds.  Pulmonary:     Effort: Pulmonary effort is normal. No respiratory distress.     Breath sounds: Normal breath sounds. No wheezing, rhonchi or rales.  Chest:       Comments: 1 cm wound dehiscence draining yellow fluid. No surrounding erythema, areas of fluctuance, non tender, no bleeding. Musculoskeletal:        General: Normal range of motion.     Cervical back: Normal range of motion.     Right lower leg: No edema.     Left lower leg: No edema.  Lymphadenopathy:      Cervical: No cervical adenopathy.  Skin:    General: Skin is warm and dry.  Neurological:     General: No focal deficit present.     Mental Status: He is alert and oriented to person, place, and time.  Psychiatric:        Mood and Affect: Mood normal.        Behavior: Behavior normal.   Procedures 1 cm x 0.25cm wound dehiscence to R pectoral crease with yellow discharge.  Unable to visualize wound bed. Surround tissue pink, blanchable with wound edges not attached. 2mm wound depth with 4mm tunneling medially Debrided with pressurized NS and forceps, removed slough and fibrin to reveal pink, blanchable issue Hemostasis acheived No anesthesia given Packed with 1/4 inch iodoform gauze Wound dressed with gauze and tegaderm Tolerated well  No results found for any visits on 02/12/24.      Assessment & Plan:   Erectile dysfunction, unspecified erectile dysfunction type -     Sildenafil  Citrate; Take 0.5-1 tablets (50-100 mg total) by mouth daily as needed for erectile dysfunction.  Dispense: 30 tablet; Refill: 3  Type 2 diabetes mellitus with hyperglycemia, without long-term current  use of insulin  (HCC) -     Semaglutide  (1 MG/DOSE); Inject 1 mg as directed once a week.  Dispense: 3 mL; Refill: 1  Wound dehiscence Assessment & Plan: Debridement, cleansing, packed, dressed F/u Friday   Overweight with body mass index (BMI) of 28 to 28.9 in adult -     Semaglutide  (1 MG/DOSE); Inject 1 mg as directed once a week.  Dispense: 3 mL; Refill: 1  Medication management -     Sildenafil  Citrate; Take 0.5-1 tablets (50-100 mg total) by mouth daily as needed for erectile dysfunction.  Dispense: 30 tablet; Refill: 3 -     Semaglutide  (1 MG/DOSE); Inject 1 mg as directed once a week.  Dispense: 3 mL; Refill: 1     Meds ordered this encounter  Medications   sildenafil  (VIAGRA ) 100 MG tablet    Sig: Take 0.5-1 tablets (50-100 mg total) by mouth daily as needed for erectile  dysfunction.    Dispense:  30 tablet    Refill:  3   Semaglutide , 1 MG/DOSE, 4 MG/3ML SOPN    Sig: Inject 1 mg as directed once a week.    Dispense:  3 mL    Refill:  1    Return for Friday for packing removal.  Wellington Half, FNP

## 2024-02-12 NOTE — Assessment & Plan Note (Signed)
 Debridement, cleansing, packed, dressed F/u Friday

## 2024-02-12 NOTE — Patient Instructions (Signed)
 Refilled ozempic at mg dosing  Refilled sildenafil  Cleansed, packed and dressed wound to the right chest.  Follow up with me on Friday for removal and re evaluation.

## 2024-02-14 ENCOUNTER — Encounter (HOSPITAL_COMMUNITY): Payer: Self-pay

## 2024-02-15 ENCOUNTER — Encounter: Payer: Self-pay | Admitting: Family Medicine

## 2024-02-15 ENCOUNTER — Ambulatory Visit (INDEPENDENT_AMBULATORY_CARE_PROVIDER_SITE_OTHER): Admitting: Family Medicine

## 2024-02-15 VITALS — BP 108/60 | HR 76 | Temp 98.3°F | Ht 72.0 in | Wt 213.0 lb

## 2024-02-15 DIAGNOSIS — Z5189 Encounter for other specified aftercare: Secondary | ICD-10-CM

## 2024-02-15 NOTE — Patient Instructions (Signed)
 Area appears to be healing well  Packing removed today  Keep the area clean and dry  Keep the area covered when out on the beach  Follow-up with me for new or worsening symptoms.

## 2024-02-15 NOTE — Progress Notes (Signed)
   Acute Office Visit  Subjective:     Patient ID: Darius Rogers, male    DOB: 1956-06-05, 68 y.o.   MRN: [id number]  Chief Complaint  Patient presents with   Wound Check    HPI Patient is in today for wound check today and to have packing removed from dehisced wound to R chest after cyst removal.  Packing was placed x 3 days ago by me.  Denies any new discharge, bleeding, pain, chills, fever, other symptoms.     ROS Per HPI      Objective:    BP 108/60 (BP Location: Right Arm, Patient Position: Sitting)   Pulse 76   Temp 98.3 F (36.8 C) (Oral)   Ht 6' (1.829 m)   Wt 213 lb (96.6 kg)   SpO2 96%   BMI 28.89 kg/m    Physical Exam Vitals and nursing note reviewed.  Constitutional:      General: He is not in acute distress.    Appearance: Normal appearance.  HENT:     Head: Normocephalic and atraumatic.     Right Ear: External ear normal.     Left Ear: External ear normal.     Nose: Nose normal.  Eyes:     Extraocular Movements: Extraocular movements intact.  Cardiovascular:     Rate and Rhythm: Normal rate.  Pulmonary:     Effort: Pulmonary effort is normal.  Chest:       Comments: 1 cm open, healing, non draining, non tender, non erythematic wound. Packing removed, tolerated well. Bandaid applied Musculoskeletal:        General: Normal range of motion.     Cervical back: Normal range of motion.     Right lower leg: No edema.     Left lower leg: No edema.  Lymphadenopathy:     Cervical: No cervical adenopathy.  Skin:    General: Skin is warm and dry.  Neurological:     General: No focal deficit present.     Mental Status: He is alert and oriented to person, place, and time.  Psychiatric:        Mood and Affect: Mood normal.        Behavior: Behavior normal.     No results found for any visits on 02/15/24.      Assessment & Plan:   Visit for wound check   Discussed to leave the area covered with a dressing when he is on the beach to  prevent debris or infection  No orders of the defined types were placed in this encounter.   Return if symptoms worsen or fail to improve.  Wellington Half, FNP

## 2024-02-21 ENCOUNTER — Encounter: Admitting: Nurse Practitioner

## 2024-03-07 ENCOUNTER — Encounter: Admitting: Nurse Practitioner

## 2024-04-04 ENCOUNTER — Other Ambulatory Visit: Payer: Self-pay | Admitting: Family Medicine

## 2024-04-04 DIAGNOSIS — E663 Overweight: Secondary | ICD-10-CM

## 2024-04-04 DIAGNOSIS — E1165 Type 2 diabetes mellitus with hyperglycemia: Secondary | ICD-10-CM

## 2024-04-04 DIAGNOSIS — Z79899 Other long term (current) drug therapy: Secondary | ICD-10-CM

## 2024-04-09 DIAGNOSIS — E119 Type 2 diabetes mellitus without complications: Secondary | ICD-10-CM | POA: Diagnosis not present

## 2024-05-02 ENCOUNTER — Other Ambulatory Visit: Payer: Self-pay | Admitting: Internal Medicine

## 2024-05-05 ENCOUNTER — Other Ambulatory Visit: Payer: Self-pay | Admitting: Family Medicine

## 2024-05-05 DIAGNOSIS — E1165 Type 2 diabetes mellitus with hyperglycemia: Secondary | ICD-10-CM

## 2024-05-05 DIAGNOSIS — Z79899 Other long term (current) drug therapy: Secondary | ICD-10-CM

## 2024-05-05 DIAGNOSIS — Z6828 Body mass index (BMI) 28.0-28.9, adult: Secondary | ICD-10-CM

## 2024-05-29 ENCOUNTER — Other Ambulatory Visit: Payer: Self-pay | Admitting: Internal Medicine

## 2024-06-02 ENCOUNTER — Other Ambulatory Visit: Payer: Self-pay | Admitting: Nurse Practitioner

## 2024-07-03 ENCOUNTER — Telehealth: Payer: Self-pay | Admitting: Internal Medicine

## 2024-07-03 MED ORDER — DAPAGLIFLOZIN PROPANEDIOL 5 MG PO TABS: 5.0000 mg | ORAL_TABLET | Freq: Every day | ORAL | 0 refills | Status: DC

## 2024-07-03 NOTE — Telephone Encounter (Signed)
 Pt's medication was sent to pt's pharmacy as requested. Confirmation received.

## 2024-07-03 NOTE — Telephone Encounter (Signed)
*  STAT* If patient is at the pharmacy, call can be transferred to refill team.   1. Which medications need to be refilled? (please list name of each medication and dose if known)   dapagliflozin  propanediol (FARXIGA ) 5 MG TABS tablet    2. Which pharmacy/location (including street and city if local pharmacy) is medication to be sent to?  [facility] [address] [place] - [address]      3. Do they need a 30 day or 90 day supply? 90 day    Pt is out of medication

## 2024-07-14 NOTE — Progress Notes (Unsigned)
 Cardiology Office Note:    Date:  07/14/2024   ID:  Darius Rogers, DOB 09-24-1956, MRN [id number]  PCP:  Elnor Lauraine FORBES, NP   [facility] Providers Cardiologist:  Vinie JAYSON Maxcy, MD { Click to update primary MD,subspecialty MD or APP then REFRESH:1}    Referring MD: Elnor Lauraine FORBES, NP   No chief complaint on file. ***  History of Present Illness:    Darius Rogers is a 68 y.o. male with a hx of CAD s/p NSTEMI with PCI/DES-LAD 04/2013, HTN, HLD, DM2, tobacco abuse, and obesity.   LHC 04/2013 with mild luminal irregularities in LCX and mid RCA stenosis of 20%. Ech oat that time with preserved LVEF 55%.   When last seen with Darius Rogers, pat was doing well but not getting regular exercise. He trades Sales executive.   Has not had evaluation since 2014.   He presents today for annual follow up.       CAD - s/p DES-LAD in the setting of NSTEMI 2014 - on ASA monotherapy - continue BB, statin   Hypertension - continue 6.25 mg BID, 10 mg lisinopril    Hyperlipidemia with LDL goal < 70 - continue statin and repatha  11/22/2023: Cholesterol 106; HDL 30.60; LDL Cholesterol 15; Triglycerides 299.0; VLDL 59.8    DM2 - on 5 mg farxiga        Past Medical History:  Diagnosis Date   Arthritis    knees (11/13/2017)   CAD (coronary artery disease)    Constipation    Heart attack (HCC) 2014   DES in LAD   Hyperlipemia    Hypertension    Type II diabetes mellitus (HCC)     Past Surgical History:  Procedure Laterality Date   COLONOSCOPY     COLONOSCOPY N/A 12/29/2019   2 simple adenomas and one benign polypoid tissue in 2021. Surveillance due in 2026   CORONARY ANGIOPLASTY WITH STENT PLACEMENT  2014   CYSTOSCOPY WITH INSERTION OF UROLIFT N/A 03/11/2018   Procedure: CYSTOSCOPY WITH INSERTION OF UROLIFT;  Surgeon: Sherrilee Belvie CROME, MD;  Location: [facility];  Service: Urology;  Laterality: N/A;   FOREARM FRACTURE SURGERY Right 1975   FRACTURE SURGERY Right 1975    R arm    JOINT REPLACEMENT     PAROTIDECTOMY Right 02/19/2018   Procedure: RIGHT PAROTIDECTOMY;  Surgeon: Karis Clunes, MD;  Location: [facility];  Service: ENT;  Laterality: Right;  RNFA   POLYPECTOMY  12/29/2019   Procedure: POLYPECTOMY;  Surgeon: Harvey Margo CROME, MD;  Location: [facility];  Service: Endoscopy;;   TOTAL KNEE ARTHROPLASTY Right 05/08/2017   Procedure: RIGHT TOTAL KNEE ARTHROPLASTY;  Surgeon: Anderson Maude ORN, MD;  Location: [facility];  Service: Orthopedics;  Laterality: Right;   TOTAL KNEE ARTHROPLASTY Left 11/13/2017   TOTAL KNEE ARTHROPLASTY Left 11/13/2017   Procedure: LEFT TOTAL KNEE ARTHROPLASTY;  Surgeon: Anderson Maude ORN, MD;  Location: [facility];  Service: Orthopedics;  Laterality: Left;    Current Medications: No outpatient medications have been marked as taking for the 07/18/24 encounter (Appointment) with Madie Jon Garre, PA.     Allergies:   Bactrim [sulfamethoxazole-trimethoprim]   Social History   Socioeconomic History   Marital status: Married    Spouse name: Not on file   Number of children: Not on file   Years of education: Not on file   Highest education level: Not on file  Occupational History   Not on file  Tobacco Use  Smoking status: Former    Current packs/day: 0.00    Average packs/day: 0.5 packs/day for 11.0 years (5.5 ttl pk-yrs)    Types: Cigarettes    Start date: 10/09/2004    Quit date: 05/09/2013    Years since quitting: 11.1    Passive exposure: Past   Smokeless tobacco: Never  Vaping Use   Vaping status: Never Used  Substance and Sexual Activity   Alcohol use: No    Alcohol/week: 0.0 standard drinks of alcohol   Drug use: No   Sexual activity: Not Currently  Other Topics Concern   Not on file  Social History Narrative   Not on file   Social Drivers of Health   Financial Resource Strain: Low Risk  (12/13/2023)   Overall Financial Resource Strain (CARDIA)    Difficulty of Paying Living Expenses: Not  hard at all  Food Insecurity: No Food Insecurity (12/13/2023)   Hunger Vital Sign    Worried About Running Out of Food in the Last Year: Never true    Ran Out of Food in the Last Year: Never true  Transportation Needs: No Transportation Needs (12/13/2023)   PRAPARE - Administrator, Civil Service (Medical): No    Lack of Transportation (Non-Medical): No  Physical Activity: Sufficiently Active (12/13/2023)   Exercise Vital Sign    Days of Exercise per Week: 7 days    Minutes of Exercise per Session: 60 min  Recent Concern: Physical Activity - Insufficiently Active (12/13/2023)   Exercise Vital Sign    Days of Exercise per Week: 2 days    Minutes of Exercise per Session: 30 min  Stress: No Stress Concern Present (12/13/2023)   [employer] of Occupational Health - Occupational Stress Questionnaire    Feeling of Stress : Not at all  Social Connections: Moderately Integrated (12/13/2023)   Social Connection and Isolation Panel    Frequency of Communication with Friends and Family: More than three times a week    Frequency of Social Gatherings with Friends and Family: Never    Attends Religious Services: 1 to 4 times per year    Active Member of [employer] or Organizations: No    Attends Engineer, structural: Never    Marital Status: Married     Family History: The patient's ***family history includes Diabetes in his mother; Heart attack in his maternal grandfather; Hypertension in his mother; Other in his maternal grandmother; Post-traumatic stress disorder in his father; Sleep apnea in his father; Stomach cancer in his paternal grandmother. There is no history of Colon cancer or Colon polyps.  ROS:   Please see the history of present illness.    *** All other systems reviewed and are negative.  EKGs/Labs/Other Studies Reviewed:    The following studies were reviewed today: ***      Recent Labs: 11/22/2023: ALT 19; BUN 16; Creatinine, Ser 1.03; Hemoglobin 16.5;  Platelets 272.0; Potassium 4.1; Sodium 139; TSH 3.05  Recent Lipid Panel    Component Value Date/Time   CHOL 106 11/22/2023 0802   CHOL 108 03/29/2022 1421   TRIG 299.0 (H) 11/22/2023 0802   HDL 30.60 (L) 11/22/2023 0802   HDL 32 (L) 03/29/2022 1421   CHOLHDL 3 11/22/2023 0802   VLDL 59.8 (H) 11/22/2023 0802   LDLCALC 15 11/22/2023 0802   LDLCALC 32 03/29/2022 1421   LDLDIRECT 35 03/29/2022 1421     Risk Assessment/Calculations:   {Does this patient have ATRIAL FIBRILLATION?:[phone]}  No BP recorded.  {  Refresh Note OR Click here to enter BP  :1}***         Physical Exam:    VS:  There were no vitals taken for this visit.    Wt Readings from Last 3 Encounters:  02/15/24 213 lb (96.6 kg)  02/12/24 213 lb (96.6 kg)  12/13/23 217 lb (98.4 kg)     GEN: *** Well nourished, well developed in no acute distress HEENT: Normal NECK: No JVD; No carotid bruits LYMPHATICS: No lymphadenopathy CARDIAC: ***RRR, no murmurs, rubs, gallops RESPIRATORY:  Clear to auscultation without rales, wheezing or rhonchi  ABDOMEN: Soft, non-tender, non-distended MUSCULOSKELETAL:  No edema; No deformity  SKIN: Warm and dry NEUROLOGIC:  Alert and oriented x 3 PSYCHIATRIC:  Normal affect   ASSESSMENT:    No diagnosis found. PLAN:    In order of problems listed above:  ***      {Are you ordering a CV Procedure (e.g. stress test, cath, DCCV, TEE, etc)?   Press F2        :[id number]}    Medication Adjustments/Labs and Tests Ordered: Current medicines are reviewed at length with the patient today.  Concerns regarding medicines are outlined above.  No orders of the defined types were placed in this encounter.  No orders of the defined types were placed in this encounter.   There are no Patient Instructions on file for this visit.   Signed, Jon Garre Eriyanna Kofoed, PA  07/14/2024 8:40 AM    [facility]

## 2024-07-17 ENCOUNTER — Other Ambulatory Visit: Payer: Self-pay | Admitting: Family

## 2024-07-18 ENCOUNTER — Other Ambulatory Visit: Payer: Self-pay

## 2024-07-18 ENCOUNTER — Telehealth: Payer: Self-pay | Admitting: Physician Assistant

## 2024-07-18 ENCOUNTER — Encounter: Payer: Self-pay | Admitting: Physician Assistant

## 2024-07-18 ENCOUNTER — Ambulatory Visit: Attending: Physician Assistant | Admitting: Physician Assistant

## 2024-07-18 VITALS — BP 112/75 | HR 73 | Ht 72.0 in | Wt 215.0 lb

## 2024-07-18 DIAGNOSIS — I2510 Atherosclerotic heart disease of native coronary artery without angina pectoris: Secondary | ICD-10-CM | POA: Diagnosis not present

## 2024-07-18 DIAGNOSIS — E1165 Type 2 diabetes mellitus with hyperglycemia: Secondary | ICD-10-CM | POA: Diagnosis not present

## 2024-07-18 DIAGNOSIS — Z9861 Coronary angioplasty status: Secondary | ICD-10-CM | POA: Diagnosis not present

## 2024-07-18 DIAGNOSIS — I10 Essential (primary) hypertension: Secondary | ICD-10-CM

## 2024-07-18 DIAGNOSIS — E785 Hyperlipidemia, unspecified: Secondary | ICD-10-CM

## 2024-07-18 LAB — HEMOGLOBIN A1C
Est. average glucose Bld gHb Est-mCnc: 140 mg/dL
Hgb A1c MFr Bld: 6.5 % — ABNORMAL HIGH (ref 4.8–5.6)

## 2024-07-18 MED ORDER — NITROGLYCERIN 0.4 MG SL SUBL
0.4000 mg | SUBLINGUAL_TABLET | SUBLINGUAL | 3 refills | Status: DC | PRN
Start: 2024-07-18 — End: 2024-07-18

## 2024-07-18 MED ORDER — REPATHA SURECLICK 140 MG/ML ~~LOC~~ SOAJ: SUBCUTANEOUS | 1 refills | Status: DC

## 2024-07-18 NOTE — Telephone Encounter (Signed)
 Pt c/o medication issue:  1. Name of Medication: nitroGLYCERIN  (NITROSTAT ) 0.4 MG SL tablet  sildenafil  (VIAGRA ) 100 MG tablet  2. How are you currently taking this medication (dosage and times per day)?   3. Are you having a reaction (difficulty breathing--STAT)? No  4. What is your medication issue? Pharmacy calling in regards to above medications having interactions with one another. Would like a c/b regarding this matter. Please advise

## 2024-07-18 NOTE — Patient Instructions (Signed)
 Medication Instructions:  START Nitroglycerin  0.4mg  Take 1 as needed for emergency chest pain. Take first dose for emergency chest pain; WAIT 5 minutes and then take 2nd dose. IF still having pain if still having chest pain CALL 911 wait an additional 5 minutes before taking final dose. Do not take more than 3 doses in a day. DO NOT TAKE 72 HOURS PRIOR TO OR 72 HOURS AFTER TAKING ERECTILE DYSFUNCTION MEDIATIONS.   *If you need a refill on your cardiac medications before your next appointment, please call your pharmacy*  Lab Work: A1C today If you have labs (blood work) drawn today and your tests are completely normal, you will receive your results only by: MyChart Message (if you have MyChart) OR A paper copy in the mail If you have any lab test that is abnormal or we need to change your treatment, we will call you to review the results.  Testing/Procedures: ECHO  Follow-Up: At [facility], you and your health needs are our priority.  As part of our continuing mission to provide you with exceptional heart care, our providers are all part of one team.  This team includes your primary Cardiologist (physician) and Advanced Practice Providers or APPs (Physician Assistants and Nurse Practitioners) who all work together to provide you with the care you need, when you need it.  Your next appointment:   1 year(s)  Provider:   Vinie JAYSON Maxcy, MD or Jon Hails, PA   We recommend signing up for the patient portal called MyChart.  Sign up information is provided on this After Visit Summary.  MyChart is used to connect with patients for Virtual Visits (Telemedicine).  Patients are able to view lab/test results, encounter notes, upcoming appointments, etc.  Non-urgent messages can be sent to your provider as well.   To learn more about what you can do with MyChart, go to [email or web address].   Other Instructions

## 2024-07-18 NOTE — Telephone Encounter (Signed)
 Spoke with pt to ensure he is aware he is not to take Sildenafil  and nitroglycerin  together due to possibility of hypotension.  Pt states he has been educated on this.  Pt thanked Charity fundraiser for the call.

## 2024-07-18 NOTE — Telephone Encounter (Signed)
 [facility] requesting a clarification on medication interaction. Nitroglycerin  and viagra  interacting, pharmacy wanting to know if prescriber is aware of the major drug interaction. Please address

## 2024-07-18 NOTE — Telephone Encounter (Signed)
 Spoke with pharmacy tech and advised office aware pt has been prescribed nitroglycerin  and Sildenafil  and pt has been educated regarding both medications.

## 2024-07-21 ENCOUNTER — Ambulatory Visit: Payer: Self-pay | Admitting: Physician Assistant

## 2024-07-24 MED ORDER — NITROGLYCERIN 0.4 MG SL SUBL: 0.4000 mg | SUBLINGUAL_TABLET | SUBLINGUAL | 3 refills | Status: AC | PRN

## 2024-07-24 NOTE — Telephone Encounter (Signed)
Refill sent to the pharmacy electronically.  

## 2024-07-27 ENCOUNTER — Other Ambulatory Visit: Payer: Self-pay | Admitting: Family Medicine

## 2024-07-27 DIAGNOSIS — Z79899 Other long term (current) drug therapy: Secondary | ICD-10-CM

## 2024-07-27 DIAGNOSIS — E663 Overweight: Secondary | ICD-10-CM

## 2024-07-27 DIAGNOSIS — E1165 Type 2 diabetes mellitus with hyperglycemia: Secondary | ICD-10-CM

## 2024-07-28 ENCOUNTER — Other Ambulatory Visit: Payer: Self-pay | Admitting: Internal Medicine

## 2024-08-04 ENCOUNTER — Ambulatory Visit: Admitting: Family Medicine

## 2024-08-04 ENCOUNTER — Ambulatory Visit: Payer: Self-pay

## 2024-08-04 ENCOUNTER — Encounter: Payer: Self-pay | Admitting: Family Medicine

## 2024-08-04 VITALS — BP 94/60 | HR 69 | Temp 98.4°F | Resp 18 | Ht 72.0 in | Wt 214.0 lb

## 2024-08-04 DIAGNOSIS — K130 Diseases of lips: Secondary | ICD-10-CM | POA: Diagnosis not present

## 2024-08-04 DIAGNOSIS — R140 Abdominal distension (gaseous): Secondary | ICD-10-CM | POA: Diagnosis not present

## 2024-08-04 DIAGNOSIS — E663 Overweight: Secondary | ICD-10-CM

## 2024-08-04 DIAGNOSIS — K5904 Chronic idiopathic constipation: Secondary | ICD-10-CM

## 2024-08-04 DIAGNOSIS — R5383 Other fatigue: Secondary | ICD-10-CM | POA: Diagnosis not present

## 2024-08-04 DIAGNOSIS — E1165 Type 2 diabetes mellitus with hyperglycemia: Secondary | ICD-10-CM

## 2024-08-04 DIAGNOSIS — Z23 Encounter for immunization: Secondary | ICD-10-CM

## 2024-08-04 NOTE — Assessment & Plan Note (Signed)
 Chronic constipation managed with Dulcolax and Linzess  when both are taken regularly. - Continue Linzess  and Dulcolax daily.

## 2024-08-04 NOTE — Telephone Encounter (Signed)
 FYI Only or Action Required?: FYI only for provider.  Patient was last seen in primary care on 02/15/2024 by Alvia Corean CROME, FNP.  Called Nurse Triage reporting Abdominal Pain.  Symptoms began a week ago.  Interventions attempted: Nothing.  Symptoms are: unchanged.  Triage Disposition: See HCP Within 4 Hours (Or PCP Triage)  Patient/caregiver understands and will follow disposition?: Yes, will follow disposition  Copied from CRM #[id number]. Topic: Clinical - Red Word Triage >> Aug 04, 2024  8:28 AM Mia F wrote: Red Word that prompted transfer to Nurse Triage: Stomach pain and unable to use the bathroom. Bloating very bad. Has been going on for about a week and getting worse. Pain is constant. Feels like he need to use the bathroom but cannot go. Barely eat and does not eat lunch and is constantly bloated. Reason for Disposition  [1] MILD-MODERATE pain AND [2] constant AND [3] present > 2 hours  Answer Assessment - Initial Assessment Questions 1. LOCATION: Where does it hurt?      Abd  2. RADIATION: Does the pain shoot anywhere else? (e.g., chest, back)     denies 3. ONSET: When did the pain begin? (Minutes, hours or days ago)      About a week 4. SUDDEN: Gradual or sudden onset?     Kinda just happened 5. PATTERN Does the pain come and go, or is it constant?     constant 6. SEVERITY: How bad is the pain?  (e.g., Scale 1-10; mild, moderate, or severe)     6 7. RECURRENT SYMPTOM: Have you ever had this type of stomach pain before? If Yes, ask: When was the last time? and What happened that time?      denies 8. CAUSE: What do you think is causing the stomach pain? (e.g., gallstones, recent abdominal surgery)     Unsure, something is wrong with my stomach 9. RELIEVING/AGGRAVATING FACTORS: What makes it better or worse? (e.g., antacids, bending or twisting motion, bowel movement)     denies 10. OTHER SYMPTOMS: Do you have any other symptoms? (e.g.,  back pain, diarrhea, fever, urination pain, vomiting)       Constipation, bloated, denies N/V/D,   Last BM was yesterday, per pt this was a normal BM for him.  Protocols used: Abdominal Pain - Male-A-AH

## 2024-08-04 NOTE — Assessment & Plan Note (Signed)
 A1c at 6.5% managed with Ozempic .

## 2024-08-04 NOTE — Assessment & Plan Note (Signed)
 Weight loss plateau on Ozempic  despite dose increase. Low testosterone may contribute to weight loss difficulty and fatigue. - Order testosterone level test. - Consider switching to Mounjaro if testosterone correction does not improve weight loss.

## 2024-08-04 NOTE — Progress Notes (Signed)
 Assessment & Plan Overweight Weight loss plateau on Ozempic  despite dose increase. Low testosterone may contribute to weight loss difficulty and fatigue. - Order testosterone level test. - Consider switching to Mounjaro if testosterone correction does not improve weight loss.    Bloating Persistent bloating possibly related to Ozempic  and constipation. No signs of gastrointestinal bleeding or malignancy. - Order comprehensive lab work to rule out other causes. - Continue current constipation management.    Cheilitis  Orders:   TSH; Future   Vitamin B12; Future   VITAMIN D 25 Hydroxy (Vit-D Deficiency, Fractures); Future  Fatigue, unspecified type  Orders:   CBC with Differential/Platelet; Future   Comprehensive metabolic panel with GFR; Future   TSH; Future   Vitamin B12; Future   VITAMIN D 25 Hydroxy (Vit-D Deficiency, Fractures); Future   Testosterone; Future  Chronic idiopathic constipation Chronic constipation managed with Dulcolax and Linzess  when both are taken regularly. - Continue Linzess  and Dulcolax daily.    Type 2 diabetes mellitus with hyperglycemia, without long-term current use of insulin  (HCC) A1c at 6.5% managed with Ozempic .     Immunization due  Orders:   Flu vaccine HIGH DOSE PF(Fluzone Trivalent)    Follow up plan: Return for chronic follow-up with PCP (patient is pastdue).  Niki Rung, MSN, APRN, FNP-C  Subjective:  HPI: Darius Rogers is a 68 y.o. male presenting on 08/04/2024 for Abdominal Pain (Bloated, hard to loose weight, on ozempic   - at a plateau /Not able to eat much, skips some meals /Slight constipation - no vomit, diarrhea, nausea ) and Mouth Lesions (Cracked corners of mouth )  Discussed the use of AI scribe software for clinical note transcription with the patient, who gave verbal consent to proceed.  He experiences persistent bloating, describing it as feeling like he ate a large meal even after consuming a small  amount. This sensation has been ongoing despite being on Ozempic  for over a year, with a dose increase approximately four months ago. He has not experienced significant weight loss, having plateaued at around 214 pounds, down from 217 pounds in February. He mentions a past weight of 250 pounds and a desire to reach 200 pounds.  He reports constipation and takes Dulcolax at night and Linzess  in the morning, noting consistent use for the past week. No vomiting, diarrhea, or nausea. He denies blood in urine, heartburn, and unintended weight loss.  He experiences low energy and fatigue, requiring a two-hour nap during the day. He describes feeling 'dizzy a little bit' and needing to rest.   His A1c was checked earlier this month and was 6.5.  He expresses concern about a family history of stomach cancer, as his paternal grandmother had it at an older age.  He mentions having cracks at the corners of his mouth.       ROS: Negative unless specifically indicated above in HPI.   Relevant past medical history reviewed and updated as indicated.   Allergies and medications reviewed and updated.   Current Outpatient Medications:    aspirin  EC 81 MG tablet, Take 81 mg by mouth daily., Disp: , Rfl:    atorvastatin  (LIPITOR) 40 MG tablet, TAKE 1 TABLET BY MOUTH ONCE DAILY AT NIGHT, Disp: 90 tablet, Rfl: 3   bisacodyl  (BISACODYL ) 5 MG EC tablet, Take 10 mg by mouth at bedtime., Disp: , Rfl:    carvedilol  (COREG ) 6.25 MG tablet, Take 6.25 mg by mouth 2 (two) times daily., Disp: , Rfl:    dapagliflozin   propanediol (FARXIGA ) 5 MG TABS tablet, Take 1 tablet (5 mg total) by mouth daily., Disp: 90 tablet, Rfl: 3   Evolocumab  (REPATHA  SURECLICK) 140 MG/ML SOAJ, INJECT 1 DOSE SUBCUTANEOUSLY EVERY 14 DAYS, Disp: 6 mL, Rfl: 1   linaclotide  (LINZESS ) 145 MCG CAPS capsule, TAKE 1 CAPSULE BY MOUTH ONCE DAILY BEFORE BREAKFAST, Disp: 90 capsule, Rfl: 3   lisinopril  (ZESTRIL ) 10 MG tablet, Take 10 mg by mouth at  bedtime., Disp: , Rfl:    loratadine (CLARITIN) 10 MG tablet, Take 10 mg by mouth at bedtime., Disp: , Rfl:    metFORMIN  (GLUCOPHAGE ) 1000 MG tablet, TAKE 1 TABLET BY MOUTH IN THE MORNING AND 1 AT BEDTIME, Disp: 90 tablet, Rfl: 0   nitroGLYCERIN  (NITROSTAT ) 0.4 MG SL tablet, Place 1 tablet (0.4 mg total) under the tongue every 5 (five) minutes as needed for chest pain. Do NOT take within 72 hours before or 72 hours after taking sildenafil ., Disp: 25 tablet, Rfl: 3   OZEMPIC , 1 MG/DOSE, 4 MG/3ML SOPN, INJECT 1 MG  SUBCUTANEOUSLY ONCE A WEEK, Disp: 3 mL, Rfl: 0   sildenafil  (VIAGRA ) 100 MG tablet, Take 0.5-1 tablets (50-100 mg total) by mouth daily as needed for erectile dysfunction., Disp: 30 tablet, Rfl: 3  Allergies  Allergen Reactions   Bactrim [Sulfamethoxazole-Trimethoprim] Hives    Objective:   BP 94/60   Pulse 69   Temp 98.4 F (36.9 C)   Resp 18   Ht 6' (1.829 m)   Wt 214 lb (97.1 kg)   SpO2 96%   BMI 29.02 kg/m    Physical Exam Vitals reviewed.  Constitutional:      General: He is not in acute distress.    Appearance: Normal appearance. He is not ill-appearing, toxic-appearing or diaphoretic.  HENT:     Head: Normocephalic and atraumatic.  Eyes:     General: No scleral icterus.       Right eye: No discharge.        Left eye: No discharge.     Conjunctiva/sclera: Conjunctivae normal.  Cardiovascular:     Rate and Rhythm: Normal rate.  Pulmonary:     Effort: Pulmonary effort is normal. No respiratory distress.  Abdominal:     General: Bowel sounds are normal.     Palpations: Abdomen is soft. There is no hepatomegaly, splenomegaly, mass or pulsatile mass.     Tenderness: There is no abdominal tenderness.  Musculoskeletal:        General: Normal range of motion.     Cervical back: Normal range of motion.  Skin:    General: Skin is warm and dry.  Neurological:     Mental Status: He is alert and oriented to person, place, and time. Mental status is at baseline.   Psychiatric:        Mood and Affect: Mood normal.        Behavior: Behavior normal.        Thought Content: Thought content normal.        Judgment: Judgment normal.

## 2024-08-05 ENCOUNTER — Other Ambulatory Visit

## 2024-08-05 DIAGNOSIS — R5383 Other fatigue: Secondary | ICD-10-CM

## 2024-08-05 DIAGNOSIS — K130 Diseases of lips: Secondary | ICD-10-CM | POA: Diagnosis not present

## 2024-08-05 LAB — COMPREHENSIVE METABOLIC PANEL WITH GFR
ALT: 25 U/L (ref 0–53)
AST: 20 U/L (ref 0–37)
Albumin: 4.3 g/dL (ref 3.5–5.2)
Alkaline Phosphatase: 64 U/L (ref 39–117)
BUN: 14 mg/dL (ref 6–23)
CO2: 24 meq/L (ref 19–32)
Calcium: 9 mg/dL (ref 8.4–10.5)
Chloride: 103 meq/L (ref 96–112)
Creatinine, Ser: 0.91 mg/dL (ref 0.40–1.50)
GFR: 87.24 mL/min (ref >60.00)
Glucose, Bld: 128 mg/dL — ABNORMAL HIGH (ref 70–99)
Potassium: 4 meq/L (ref 3.5–5.1)
Sodium: 137 meq/L (ref 135–145)
Total Bilirubin: 0.5 mg/dL (ref 0.2–1.2)
Total Protein: 7.3 g/dL (ref 6.0–8.3)

## 2024-08-05 LAB — CBC WITH DIFFERENTIAL/PLATELET
Basophils Absolute: 0 K/uL (ref 0.0–0.1)
Basophils Relative: 0.4 % (ref 0.0–3.0)
Eosinophils Absolute: 0.2 K/uL (ref 0.0–0.7)
Eosinophils Relative: 2.1 % (ref 0.0–5.0)
HCT: 48.5 % (ref 39.0–52.0)
Hemoglobin: 16.2 g/dL (ref 13.0–17.0)
Lymphocytes Relative: 32 % (ref 12.0–46.0)
Lymphs Abs: 3.6 K/uL (ref 0.7–4.0)
MCHC: 33.4 g/dL (ref 30.0–36.0)
MCV: 90.1 fl (ref 78.0–100.0)
Monocytes Absolute: 0.8 K/uL (ref 0.1–1.0)
Monocytes Relative: 6.7 % (ref 3.0–12.0)
Neutro Abs: 6.7 K/uL (ref 1.4–7.7)
Neutrophils Relative %: 58.8 % (ref 43.0–77.0)
Platelets: 268 K/uL (ref 150.0–400.0)
RBC: 5.39 Mil/uL (ref 4.22–5.81)
RDW: 14.1 % (ref 11.5–15.5)
WBC: 11.3 K/uL — ABNORMAL HIGH (ref 4.0–10.5)

## 2024-08-05 LAB — TSH: TSH: 2.74 u[IU]/mL (ref 0.35–5.50)

## 2024-08-05 LAB — VITAMIN B12: Vitamin B-12: 191 pg/mL — ABNORMAL LOW (ref 211–911)

## 2024-08-05 LAB — TESTOSTERONE: Testosterone: 242.45 ng/dL — ABNORMAL LOW (ref 300.00–890.00)

## 2024-08-05 LAB — VITAMIN D 25 HYDROXY (VIT D DEFICIENCY, FRACTURES): VITD: 25.66 ng/mL — ABNORMAL LOW (ref 30.00–100.00)

## 2024-08-06 ENCOUNTER — Encounter: Payer: Self-pay | Admitting: Family Medicine

## 2024-08-06 ENCOUNTER — Ambulatory Visit: Payer: Self-pay | Admitting: Family Medicine

## 2024-08-06 DIAGNOSIS — R7989 Other specified abnormal findings of blood chemistry: Secondary | ICD-10-CM | POA: Insufficient documentation

## 2024-08-06 DIAGNOSIS — E559 Vitamin D deficiency, unspecified: Secondary | ICD-10-CM | POA: Insufficient documentation

## 2024-08-06 DIAGNOSIS — E538 Deficiency of other specified B group vitamins: Secondary | ICD-10-CM | POA: Insufficient documentation

## 2024-08-07 ENCOUNTER — Other Ambulatory Visit (INDEPENDENT_AMBULATORY_CARE_PROVIDER_SITE_OTHER)

## 2024-08-07 DIAGNOSIS — R7989 Other specified abnormal findings of blood chemistry: Secondary | ICD-10-CM | POA: Diagnosis not present

## 2024-08-07 LAB — FERRITIN: Ferritin: 57.1 ng/mL (ref 22.0–322.0)

## 2024-08-07 LAB — LUTEINIZING HORMONE: LH: 7.16 m[IU]/mL (ref 1.50–9.30)

## 2024-08-07 LAB — FOLLICLE STIMULATING HORMONE: FSH: 5.3 m[IU]/mL (ref 1.4–18.1)

## 2024-08-09 LAB — TESTOSTERONE, FREE, TOTAL, SHBG
Sex Hormone Binding: 24.4 nmol/L (ref 19.3–76.4)
Testosterone, Free: 8.9 pg/mL (ref 6.6–18.1)
Testosterone: 392 ng/dL (ref 264–916)

## 2024-08-11 ENCOUNTER — Ambulatory Visit: Payer: Self-pay | Admitting: Family Medicine

## 2024-08-11 ENCOUNTER — Ambulatory Visit (INDEPENDENT_AMBULATORY_CARE_PROVIDER_SITE_OTHER): Admitting: Family Medicine

## 2024-08-11 ENCOUNTER — Encounter: Payer: Self-pay | Admitting: Family Medicine

## 2024-08-11 VITALS — BP 100/62 | HR 74 | Temp 98.7°F | Ht 72.0 in | Wt 214.0 lb

## 2024-08-11 DIAGNOSIS — R7989 Other specified abnormal findings of blood chemistry: Secondary | ICD-10-CM

## 2024-08-11 DIAGNOSIS — K5904 Chronic idiopathic constipation: Secondary | ICD-10-CM | POA: Diagnosis not present

## 2024-08-11 DIAGNOSIS — E1165 Type 2 diabetes mellitus with hyperglycemia: Secondary | ICD-10-CM

## 2024-08-11 DIAGNOSIS — I10 Essential (primary) hypertension: Secondary | ICD-10-CM | POA: Diagnosis not present

## 2024-08-11 DIAGNOSIS — Z7985 Long-term (current) use of injectable non-insulin antidiabetic drugs: Secondary | ICD-10-CM | POA: Diagnosis not present

## 2024-08-11 DIAGNOSIS — E538 Deficiency of other specified B group vitamins: Secondary | ICD-10-CM

## 2024-08-11 DIAGNOSIS — E559 Vitamin D deficiency, unspecified: Secondary | ICD-10-CM

## 2024-08-11 LAB — PROLACTIN: Prolactin: 4.3 ng/mL (ref 2.0–18.0)

## 2024-08-11 LAB — TESTOSTERONE, FREE: TESTOSTERONE FREE: 73.7 pg/mL (ref 46.0–224.0)

## 2024-08-11 MED ORDER — LISINOPRIL 5 MG PO TABS: 5.0000 mg | ORAL_TABLET | Freq: Every day | ORAL | 3 refills | Status: AC

## 2024-08-11 MED ORDER — TIRZEPATIDE 2.5 MG/0.5ML ~~LOC~~ SOAJ: 2.5000 mg | SUBCUTANEOUS | 0 refills | Status: DC

## 2024-08-11 NOTE — Progress Notes (Unsigned)
   Established Patient Office Visit  Subjective:     Patient ID: Darius Rogers, male    DOB: Feb 13, 1956, 68 y.o.   MRN: [id number]  No chief complaint on file.   HPI  Discussed the use of AI scribe software for clinical note transcription with the patient, who gave verbal consent to proceed.  History of Present Illness      ROS Per HPI      Objective:    There were no vitals taken for this visit.   Physical Exam Vitals and nursing note reviewed.  Constitutional:      General: He is not in acute distress.    Appearance: Normal appearance.  HENT:     Head: Normocephalic and atraumatic.     Right Ear: External ear normal.     Left Ear: External ear normal.     Nose: Nose normal.     Mouth/Throat:     Mouth: Mucous membranes are moist.     Pharynx: Oropharynx is clear.  Eyes:     Extraocular Movements: Extraocular movements intact.  Cardiovascular:     Rate and Rhythm: Normal rate and regular rhythm.     Pulses: Normal pulses.     Heart sounds: Normal heart sounds.  Pulmonary:     Effort: Pulmonary effort is normal. No respiratory distress.     Breath sounds: Normal breath sounds. No wheezing, rhonchi or rales.  Musculoskeletal:        General: Normal range of motion.     Cervical back: Normal range of motion.     Right lower leg: No edema.     Left lower leg: No edema.  Lymphadenopathy:     Cervical: No cervical adenopathy.  Skin:    General: Skin is warm and dry.  Neurological:     General: No focal deficit present.     Mental Status: He is alert and oriented to person, place, and time.  Psychiatric:        Mood and Affect: Mood normal.        Behavior: Behavior normal.     No results found for any visits on 08/11/24.  The ASCVD Risk score (Arnett DK, et al., 2019) failed to calculate for the following reasons:   Risk score cannot be calculated because patient has a medical history suggesting prior/existing ASCVD  {Vitals History  (Optional):23777}  {Show previous labs (optional):23779}     Assessment & Plan:   Assessment and Plan Assessment & Plan      No orders of the defined types were placed in this encounter.    No orders of the defined types were placed in this encounter.   No follow-ups on file.  Corean LITTIE Ku, FNP

## 2024-08-14 ENCOUNTER — Other Ambulatory Visit: Payer: Self-pay | Admitting: Nurse Practitioner

## 2024-08-14 NOTE — Telephone Encounter (Signed)
 Copied from CRM #[id number]. Topic: Clinical - Medication Refill >> Aug 14, 2024  4:55 PM Jasmin G wrote: Medication: metFORMIN  (GLUCOPHAGE ) 1000 MG tablet  Has the patient contacted their pharmacy? Yes (Agent: If no, request that the patient contact the pharmacy for the refill. If patient does not wish to contact the pharmacy document the reason why and proceed with request.) (Agent: If yes, when and what did the pharmacy advise?)  This is the patient's preferred pharmacy:  [facility] [address] [place] - [address] [place] [address] Phone: [phone] Fax: [phone]  Is this the correct pharmacy for this prescription? Yes If no, delete pharmacy and type the correct one.   Has the prescription been filled recently? Yes  Is the patient out of the medication? Yes  Has the patient been seen for an appointment in the last year OR does the patient have an upcoming appointment? Yes  Can we respond through MyChart? No  Agent: Please be advised that Rx refills may take up to 3 business days. We ask that you follow-up with your pharmacy.

## 2024-08-18 MED ORDER — METFORMIN HCL 1000 MG PO TABS: 1000.0000 mg | ORAL_TABLET | Freq: Two times a day (BID) | ORAL | 1 refills | Status: AC

## 2024-08-25 ENCOUNTER — Other Ambulatory Visit (INDEPENDENT_AMBULATORY_CARE_PROVIDER_SITE_OTHER)

## 2024-08-25 DIAGNOSIS — R7989 Other specified abnormal findings of blood chemistry: Secondary | ICD-10-CM

## 2024-08-27 LAB — TESTOSTERONE,FREE AND TOTAL
Testosterone, Free: 8.3 pg/mL (ref 6.6–18.1)
Testosterone: 333 ng/dL (ref 264–916)

## 2024-09-01 ENCOUNTER — Ambulatory Visit (HOSPITAL_COMMUNITY)
Admission: RE | Admit: 2024-09-01 | Discharge: 2024-09-01 | Disposition: A | Discharge status: Home / Self Care | Source: Ambulatory Visit | Attending: Cardiovascular Disease | Admitting: Cardiovascular Disease

## 2024-09-01 DIAGNOSIS — I2510 Atherosclerotic heart disease of native coronary artery without angina pectoris: Secondary | ICD-10-CM | POA: Diagnosis not present

## 2024-09-02 LAB — ECHOCARDIOGRAM COMPLETE
Area-P 1/2: 2.44 cm2
S' Lateral: 2.6 cm

## 2024-09-03 ENCOUNTER — Ambulatory Visit: Payer: Self-pay | Admitting: Family Medicine

## 2024-09-09 ENCOUNTER — Other Ambulatory Visit: Payer: Self-pay | Admitting: Family Medicine

## 2024-09-09 DIAGNOSIS — E1165 Type 2 diabetes mellitus with hyperglycemia: Secondary | ICD-10-CM

## 2024-09-09 NOTE — Progress Notes (Signed)
 Darius Rogers                                          MRN: [id number]   09/09/2024   The [facility] Quality Team Specialist reviewed this patient medical record for the purposes of chart review for care gap closure. The following were reviewed: chart review for care gap closure-kidney health evaluation for diabetes:eGFR  and uACR.    [facility]I Quality Team

## 2024-09-23 ENCOUNTER — Ambulatory Visit: Admitting: Family Medicine

## 2024-09-24 ENCOUNTER — Ambulatory Visit: Admitting: Family Medicine

## 2024-09-24 ENCOUNTER — Encounter: Payer: Self-pay | Admitting: Family Medicine

## 2024-09-24 VITALS — BP 130/64 | HR 79 | Temp 98.5°F | Ht 72.0 in | Wt 218.0 lb

## 2024-09-24 DIAGNOSIS — I2510 Atherosclerotic heart disease of native coronary artery without angina pectoris: Secondary | ICD-10-CM

## 2024-09-24 DIAGNOSIS — R7989 Other specified abnormal findings of blood chemistry: Secondary | ICD-10-CM

## 2024-09-24 DIAGNOSIS — I10 Essential (primary) hypertension: Secondary | ICD-10-CM | POA: Diagnosis not present

## 2024-09-24 DIAGNOSIS — E1165 Type 2 diabetes mellitus with hyperglycemia: Secondary | ICD-10-CM | POA: Diagnosis not present

## 2024-09-24 DIAGNOSIS — E785 Hyperlipidemia, unspecified: Secondary | ICD-10-CM | POA: Diagnosis not present

## 2024-09-24 DIAGNOSIS — Z7985 Long-term (current) use of injectable non-insulin antidiabetic drugs: Secondary | ICD-10-CM | POA: Diagnosis not present

## 2024-09-24 DIAGNOSIS — Z79899 Other long term (current) drug therapy: Secondary | ICD-10-CM | POA: Diagnosis not present

## 2024-09-24 MED ORDER — REPATHA SURECLICK 140 MG/ML ~~LOC~~ SOAJ: SUBCUTANEOUS | 12 refills | Status: AC

## 2024-09-24 MED ORDER — TIRZEPATIDE 5 MG/0.5ML ~~LOC~~ SOAJ: 5.0000 mg | SUBCUTANEOUS | 0 refills | Status: DC

## 2024-09-24 NOTE — Progress Notes (Signed)
 Established Patient Office Visit  Subjective:     Patient ID: Darius Rogers, male    DOB: Jan 09, 1956, 68 y.o.   MRN: [id number]  Chief Complaint  Patient presents with   Follow-up    4wk f/u Severe heartburn that started 2wks ago having to take omeprazole 20 mg daily to help   Headache    HPI  Discussed the use of AI scribe software for clinical note transcription with the patient, who gave verbal consent to proceed.  History of Present Illness Darius Rogers is a 68 year old male who presents for follow-up regarding weight management and medication adjustment.  Weight management - Weight gain persists despite use of low-dose Mounjaro  2.5 mg. - Frustration with perceived inadequacy of current dose.  Gastroesophageal reflux and indigestion - Significant indigestion and reflux are well controlled with daily morning medication. - Hesitant to discontinue medication due to fear of symptom recurrence.  Hyperlipidemia management - Uses Repatha  every other week for lipid control. - Recent dosing supply confusion was clarified to ensure proper ongoing use.  Psychosocial adjustment - Father and mother died this year after several years of him providing care. - Coping is good and no current distress affecting functioning.     ROS Per HPI      Objective:    BP 130/64 (BP Location: Left Arm, Patient Position: Sitting)   Pulse 79   Temp 98.5 F (36.9 C) (Temporal)   Ht 6' (1.829 m)   Wt 218 lb (98.9 kg)   SpO2 93%   BMI 29.57 kg/m    Physical Exam Vitals and nursing note reviewed.  Constitutional:      General: He is not in acute distress.    Appearance: Normal appearance.  HENT:     Head: Normocephalic and atraumatic.     Right Ear: External ear normal.     Left Ear: External ear normal.     Nose: Nose normal.     Mouth/Throat:     Mouth: Mucous membranes are moist.     Pharynx: Oropharynx is clear.  Eyes:     Extraocular Movements: Extraocular movements  intact.  Cardiovascular:     Rate and Rhythm: Normal rate and regular rhythm.     Pulses: Normal pulses.     Heart sounds: Normal heart sounds.  Pulmonary:     Effort: Pulmonary effort is normal. No respiratory distress.     Breath sounds: Normal breath sounds. No wheezing, rhonchi or rales.  Musculoskeletal:        General: Normal range of motion.     Cervical back: Normal range of motion.     Right lower leg: No edema.     Left lower leg: No edema.  Lymphadenopathy:     Cervical: No cervical adenopathy.  Skin:    General: Skin is warm and dry.  Neurological:     General: No focal deficit present.     Mental Status: He is alert and oriented to person, place, and time.  Psychiatric:        Mood and Affect: Mood normal.        Behavior: Behavior normal.     No results found for any visits on 09/24/24.    BP Readings from Last 3 Encounters:  09/24/24 130/64  08/11/24 100/62  08/04/24 94/60   Wt Readings from Last 3 Encounters:  09/24/24 218 lb (98.9 kg)  08/11/24 214 lb (97.1 kg)  08/04/24 214 lb (97.1 kg)  Last CBC Lab Results  Component Value Date   WBC 11.3 (H) 08/05/2024   HGB 16.2 08/05/2024   HCT 48.5 08/05/2024   MCV 90.1 08/05/2024   MCH 29.9 08/20/2019   RDW 14.1 08/05/2024   PLT 268.0 08/05/2024   Last metabolic panel Lab Results  Component Value Date   GLUCOSE 128 (H) 08/05/2024   NA 137 08/05/2024   K 4.0 08/05/2024   CL 103 08/05/2024   CO2 24 08/05/2024   BUN 14 08/05/2024   CREATININE 0.91 08/05/2024   GFR 87.24 08/05/2024   CALCIUM  9.0 08/05/2024   PROT 7.3 08/05/2024   ALBUMIN 4.3 08/05/2024   BILITOT 0.5 08/05/2024   ALKPHOS 64 08/05/2024   AST 20 08/05/2024   ALT 25 08/05/2024   ANIONGAP 11 08/20/2019   Last lipids Lab Results  Component Value Date   CHOL 106 11/22/2023   HDL 30.60 (L) 11/22/2023   LDLCALC 15 11/22/2023   LDLDIRECT 35 03/29/2022   TRIG 299.0 (H) 11/22/2023   CHOLHDL 3 11/22/2023   Last hemoglobin  A1c Lab Results  Component Value Date   HGBA1C 6.5 (H) 07/18/2024   Last thyroid  functions Lab Results  Component Value Date   TSH 2.74 08/05/2024   Last vitamin D  Lab Results  Component Value Date   VD25OH 25.66 (L) 08/05/2024   Last vitamin B12 and Folate Lab Results  Component Value Date   VITAMINB12 191 (L) 08/05/2024         Assessment & Plan:   Assessment and Plan Assessment & Plan Type 2 diabetes mellitus with hyperglycemia, without long term use of insulin , long term use of noninsulin injectable Weight gain noted, possibly related to medication. No significant symptom changes. - Increased Mounjaro  dosage.  CAD in native artery No new symptoms related to heart disease.  Hyperlipidemia LDL goal < 70 Cholesterol well-controlled with current regimen. - Ensured Repatha  refills are available.  Low testosterone  Testosterone  level at 333, low but not critically low. Recheck planned. - Ordered testosterone  level test.  Essential hypertension Blood pressure well-controlled. - Continue current blood pressure management.  Medication Management - labs today, will dose adjust medications as indicated      Orders Placed This Encounter  Procedures   Comprehensive metabolic panel with eGFR (no eGFR if sent to [facility])    Standing Status:   Future    Expiration Date:   09/24/2025   Urine microalbumin-creatinine with uACR    Standing Status:   Future    Expiration Date:   09/24/2025   Testosterone     Standing Status:   Future    Expiration Date:   09/24/2025     Meds ordered this encounter  Medications   Evolocumab  (REPATHA  SURECLICK) 140 MG/ML SOAJ    Sig: INJECT 1 DOSE SUBCUTANEOUSLY EVERY 14 DAYS    Dispense:  2 mL    Refill:  12   tirzepatide  (MOUNJARO ) 5 MG/0.5ML Pen    Sig: Inject 5 mg into the skin once a week.    Dispense:  2 mL    Refill:  0    Return in about 3 months (around 12/23/2024) for meds OV.  Corean LITTIE Ku, FNP

## 2024-09-24 NOTE — Patient Instructions (Signed)
 INCREASE Mounjaro  to 5 mg once weekly  Refilled Repatha  for one injection every 14 days.  Future labs ordered for testosterone , before 10am.  Follow up with me in about 3 months for labs and medication management, sooner if needed.

## 2024-10-21 ENCOUNTER — Other Ambulatory Visit: Payer: Self-pay | Admitting: Family Medicine

## 2024-10-21 DIAGNOSIS — E1165 Type 2 diabetes mellitus with hyperglycemia: Secondary | ICD-10-CM

## 2024-11-14 ENCOUNTER — Other Ambulatory Visit: Payer: Self-pay | Admitting: Nurse Practitioner

## 2024-11-14 DIAGNOSIS — E1165 Type 2 diabetes mellitus with hyperglycemia: Secondary | ICD-10-CM

## 2024-11-14 NOTE — Telephone Encounter (Signed)
 LVM for patient

## 2024-11-25 ENCOUNTER — Ambulatory Visit: Admitting: Family Medicine

## 2025-02-19 ENCOUNTER — Ambulatory Visit
